# Patient Record
Sex: Female | Born: 1945
Health system: Southern US, Community
[De-identification: ages and names within clinical notes are randomized; demographics above are authoritative.]

## PROBLEM LIST (undated history)

## (undated) DIAGNOSIS — Z8719 Personal history of other diseases of the digestive system: Secondary | ICD-10-CM

## (undated) DIAGNOSIS — E039 Hypothyroidism, unspecified: Secondary | ICD-10-CM

## (undated) DIAGNOSIS — F32A Depression, unspecified: Secondary | ICD-10-CM

## (undated) DIAGNOSIS — R011 Cardiac murmur, unspecified: Secondary | ICD-10-CM

## (undated) DIAGNOSIS — I2510 Atherosclerotic heart disease of native coronary artery without angina pectoris: Secondary | ICD-10-CM

## (undated) DIAGNOSIS — D649 Anemia, unspecified: Secondary | ICD-10-CM

## (undated) DIAGNOSIS — E559 Vitamin D deficiency, unspecified: Secondary | ICD-10-CM

## (undated) DIAGNOSIS — R519 Headache, unspecified: Secondary | ICD-10-CM

## (undated) DIAGNOSIS — F419 Anxiety disorder, unspecified: Secondary | ICD-10-CM

## (undated) DIAGNOSIS — Z9889 Other specified postprocedural states: Secondary | ICD-10-CM

## (undated) DIAGNOSIS — I739 Peripheral vascular disease, unspecified: Secondary | ICD-10-CM

## (undated) DIAGNOSIS — I10 Essential (primary) hypertension: Secondary | ICD-10-CM

## (undated) DIAGNOSIS — R112 Nausea with vomiting, unspecified: Secondary | ICD-10-CM

## (undated) DIAGNOSIS — Z720 Tobacco use: Secondary | ICD-10-CM

## (undated) DIAGNOSIS — J45909 Unspecified asthma, uncomplicated: Secondary | ICD-10-CM

## (undated) DIAGNOSIS — E785 Hyperlipidemia, unspecified: Secondary | ICD-10-CM

## (undated) DIAGNOSIS — K219 Gastro-esophageal reflux disease without esophagitis: Secondary | ICD-10-CM

## (undated) DIAGNOSIS — M1990 Unspecified osteoarthritis, unspecified site: Secondary | ICD-10-CM

## (undated) HISTORY — DX: Essential (primary) hypertension: I10

## (undated) HISTORY — PX: CATARACT EXTRACTION W/ INTRAOCULAR LENS IMPLANT: SHX1309

## (undated) HISTORY — DX: Cardiac murmur, unspecified: R01.1

## (undated) HISTORY — DX: Gastro-esophageal reflux disease without esophagitis: K21.9

## (undated) HISTORY — DX: Vitamin D deficiency, unspecified: E55.9

## (undated) HISTORY — DX: Anxiety disorder, unspecified: F41.9

## (undated) HISTORY — DX: Hypothyroidism, unspecified: E03.9

## (undated) HISTORY — DX: Peripheral vascular disease, unspecified: I73.9

## (undated) HISTORY — PX: TONSILLECTOMY: SUR1361

## (undated) HISTORY — DX: Hyperlipidemia, unspecified: E78.5

## (undated) HISTORY — DX: Tobacco use: Z72.0

## (undated) HISTORY — PX: KNEE SURGERY: SHX244

---

## 2010-07-08 ENCOUNTER — Ambulatory Visit (HOSPITAL_COMMUNITY): Payer: Self-pay

## 2010-10-29 ENCOUNTER — Other Ambulatory Visit: Payer: Self-pay | Admitting: Cardiovascular Disease

## 2010-10-29 ENCOUNTER — Ambulatory Visit
Admission: RE | Admit: 2010-10-29 | Discharge: 2010-10-29 | Disposition: A | Discharge status: Home / Self Care | Payer: Medicare Other | Source: Ambulatory Visit | Attending: Cardiovascular Disease | Admitting: Cardiovascular Disease

## 2010-10-29 DIAGNOSIS — Z01811 Encounter for preprocedural respiratory examination: Secondary | ICD-10-CM

## 2010-11-04 ENCOUNTER — Ambulatory Visit (HOSPITAL_COMMUNITY)
Admission: RE | Admit: 2010-11-04 | Discharge: 2010-11-04 | Disposition: A | Discharge status: Home / Self Care | Payer: Medicare Other | Source: Ambulatory Visit | Attending: Cardiovascular Disease | Admitting: Cardiovascular Disease

## 2010-11-04 DIAGNOSIS — I10 Essential (primary) hypertension: Secondary | ICD-10-CM | POA: Insufficient documentation

## 2010-11-04 DIAGNOSIS — Z87891 Personal history of nicotine dependence: Secondary | ICD-10-CM | POA: Insufficient documentation

## 2010-11-04 DIAGNOSIS — I252 Old myocardial infarction: Secondary | ICD-10-CM | POA: Insufficient documentation

## 2010-11-04 DIAGNOSIS — I70219 Atherosclerosis of native arteries of extremities with intermittent claudication, unspecified extremity: Secondary | ICD-10-CM | POA: Insufficient documentation

## 2010-11-04 DIAGNOSIS — Z01812 Encounter for preprocedural laboratory examination: Secondary | ICD-10-CM | POA: Insufficient documentation

## 2010-11-04 DIAGNOSIS — K219 Gastro-esophageal reflux disease without esophagitis: Secondary | ICD-10-CM | POA: Insufficient documentation

## 2010-11-04 DIAGNOSIS — I708 Atherosclerosis of other arteries: Secondary | ICD-10-CM | POA: Insufficient documentation

## 2010-11-04 DIAGNOSIS — I701 Atherosclerosis of renal artery: Secondary | ICD-10-CM | POA: Insufficient documentation

## 2010-11-04 DIAGNOSIS — J45909 Unspecified asthma, uncomplicated: Secondary | ICD-10-CM | POA: Insufficient documentation

## 2010-11-25 ENCOUNTER — Ambulatory Visit (HOSPITAL_COMMUNITY)
Admission: RE | Admit: 2010-11-25 | Discharge: 2010-11-26 | Disposition: A | Discharge status: Home / Self Care | Payer: Medicare Other | Source: Ambulatory Visit | Attending: Cardiovascular Disease | Admitting: Cardiovascular Disease

## 2010-11-25 DIAGNOSIS — J45909 Unspecified asthma, uncomplicated: Secondary | ICD-10-CM | POA: Insufficient documentation

## 2010-11-25 DIAGNOSIS — I10 Essential (primary) hypertension: Secondary | ICD-10-CM | POA: Insufficient documentation

## 2010-11-25 DIAGNOSIS — I4891 Unspecified atrial fibrillation: Secondary | ICD-10-CM | POA: Insufficient documentation

## 2010-11-25 DIAGNOSIS — I708 Atherosclerosis of other arteries: Secondary | ICD-10-CM | POA: Insufficient documentation

## 2010-11-25 DIAGNOSIS — D649 Anemia, unspecified: Secondary | ICD-10-CM | POA: Insufficient documentation

## 2010-11-25 DIAGNOSIS — K219 Gastro-esophageal reflux disease without esophagitis: Secondary | ICD-10-CM | POA: Insufficient documentation

## 2010-11-25 DIAGNOSIS — I739 Peripheral vascular disease, unspecified: Secondary | ICD-10-CM | POA: Insufficient documentation

## 2010-11-25 DIAGNOSIS — Z87891 Personal history of nicotine dependence: Secondary | ICD-10-CM | POA: Insufficient documentation

## 2010-11-25 HISTORY — PX: OTHER SURGICAL HISTORY: SHX169

## 2010-11-26 LAB — BASIC METABOLIC PANEL
CO2: 29 mEq/L (ref 19–32)
Calcium: 9.1 mg/dL (ref 8.4–10.5)
GFR calc Af Amer: >60 mL/min (ref >60)
GFR calc non Af Amer: >60 mL/min (ref >60)
Sodium: 142 mEq/L (ref 135–145)

## 2010-11-26 LAB — CBC
MCH: 31.4 pg (ref 26.0–34.0)
MCHC: 31.9 g/dL (ref 30.0–36.0)
Platelets: 290 10*3/uL (ref 150–400)
RBC: 3.06 MIL/uL — ABNORMAL LOW (ref 3.87–5.11)

## 2010-11-26 LAB — POCT ACTIVATED CLOTTING TIME: Activated Clotting Time: 226 seconds

## 2010-11-28 NOTE — Discharge Summary (Signed)
  NAME:  Alexis [name], Alexis [name]              ACCOUNT NO.:  [id number]  MEDICAL RECORD NO.:  [id number]  LOCATION:  2506                         FACILITY:  [facility]  PHYSICIAN:  [name], M.D.   DATE OF BIRTH:  18-Apr-1945  DATE OF ADMISSION:  11/25/2010 DATE OF DISCHARGE:  11/26/2010                              DISCHARGE SUMMARY   DISCHARGE DIAGNOSES: 1. Claudication. 2. Vascular disease status post bilateral iliac percutaneous     transluminal angioplasty this admission. 3. Anemia, hemoglobin was 13.6 prior to procedure, 9.6 at discharge     with some ecchymosis and bruising of her right femoral artery site. 4. Low-risk Myoview an essentially normal echocardiogram as an     outpatient. 5. History of smoking, quit in 2005. 6. Treated hypertension. 7. Brief run of paroxysmal atrial tachycardia during this admission.  HOSPITAL COURSE:  [name] is a pleasant 65 year old female followed by Dr. Allyson Sabal in [facility].  She has had claudication as an outpatient.  She was admitted for elective PV angiogram on November 25, 2010.  Please see Dr. Hazle Coca complete note for details.  She tolerated this well with 90% bilateral iliac stenosis reduced to zero. Postoperatively, she is stable and we feel she can be discharged on November 26, 2010.  She is somewhat anemic compared to her admission but this may be from procedure and hematoma.  We will check her CBC as an outpatient.  We did add low-dose beta-blocker.  She will follow up with Dr. Allyson Sabal.  DISCHARGE LABORATORY DATA:  White count 12.4, hemoglobin 9.6, hematocrit 30.1, and platelets 290.  Sodium 142, potassium 3.9, BUN 11, and creatinine 0.7.  See med rec for complete discharge medications.  She will have a CBC on Monday as well.     Abelino Derrick, P.A.   ______________________________ [name], M.D.    Lenard Lance  D:  11/26/2010  T:  11/26/2010  Job:  [id number]  cc:   Olena [facility]  Electronically Signed by Corine Shelter P.A. on 11/27/2010 04:54:09 PM Electronically Signed by [name] M.D. on 11/28/2010 11:44:56 AM

## 2010-11-28 NOTE — Procedures (Signed)
NAME:  Alexis [name], Alexis [name]              ACCOUNT NO.:  [id number]  MEDICAL RECORD NO.:  [id number]  LOCATION:  [facility]                         FACILITY:  [facility]  PHYSICIAN:  [name], M.D.   DATE OF BIRTH:  Oct 19, 1945  DATE OF PROCEDURE:  11/04/2010 DATE OF DISCHARGE:                   PERIPHERAL VASCULAR INVASIVE PROCEDURE   HISTORY:  Alexis [name] is a 65 year old thin-appearing married Caucasian female, mother of one, with her grandchildren, I saw in the office on October 21, 2010, referred by Dr. Italy Hilty, for peripheral vascular evaluation.  Risk factors include hypertension.  She has claudication, worsening over the last 6 months.  The patient does occasionally get chest tightness, but the Myoview was abnormal.  She does have 80-pack-year of tobacco abuse, having quit in 2005.  Apparently had an MI at the age of 24.  A 2- D echo revealed normal LV systolic function.  Lower extremity Dopplers revealed ABIs of 0.3 and 0.4 bilaterally with occluded distal aorta. Because of that she presents today for abdominal aortography, bifemoral runoff via the right radial approach.  DESCRIPTION OF PROCEDURE:  The patient brought to the second floor of [facility] in the post absorptive state.  She is premedicated with p.o. Valium, IV Versed, and fentanyl.  Her right wrist was prepped and shaved in the usual sterile fashion.  1% Xylocaine was used for local anesthesia.  A 5-French sheath was inserted into the right radial artery using standard back lock pull back technique.  A 5- French pigtail catheter was then advanced down the descending aorta to level of the renal arteries.  The patient did have a type 3 aortic arch. Abdominal aortography with bifemoral runoff using bolus chase, digital subtraction step table technique then performed.  Retrograde pressures were monitored during the case.  The patient did receive 4000 units heparin intravenously as well as radial cocktail  via side-arm sheath.  ANGIOGRAPHIC RESULT: 1. Abdominal aorta;     a.     Renal arteries - 40% left renal artery stenosis. 2. Left lower extremity;     a.     60% segmental ostial size proximal left common iliac artery      stenosis.     b.     95-99% stenosis of the mid left common iliac artery.     c.     50% stenosis of the left common femoral artery.     d.     50% segmental mid left SFA stenosis with two-vessel runoff. 3. The posterior tibial was occluded with both sides. 4. Right lower extremity;     a.     95% proximal right common iliac artery stenosis.     b.     Two-vessel runoff an occluded posterior tibial.  IMPRESSION:  Alexis [name] has high-grade bilateral iliac disease responsible for decreased ABIs and claudication.  I believe she is a candidate for PTA and stenting.  The wire and catheter removed.  Sheath was removed and a 2-0 band was placed on the right wrist to obtain patent hemostasis.  The patient left lab in stable condition.  She gently hydrated, discharged home later today as an outpatient.  I will see back in the office next  week.  At that time, we will arrange for him back for a bilateral iliac intervention.     [name], M.D.     [name]  D:  11/04/2010  T:  11/04/2010  Job:  [id number]  cc:   Italy Hilty, MD [facility] [place], [place] [facility] PV [facility]iography Suite  Electronically Signed by [name] M.D. on 11/28/2010 11:44:50 AM

## 2010-11-28 NOTE — Procedures (Signed)
  NAME:  [name], Alexis              ACCOUNT NO.:  [id number]  MEDICAL RECORD NO.:  [id number]  LOCATION:  [facility]                         FACILITY:  [facility]  PHYSICIAN:  [name], M.D.   DATE OF BIRTH:  Jul 24, 1945  DATE OF PROCEDURE:  11/25/2010 DATE OF DISCHARGE:                   PERIPHERAL VASCULAR INVASIVE PROCEDURE   Bilateral PTA and stent procedure using "kissing stents/balloon technique on the patient [employer].  Ms. Delmont is a 65 year old appearing married Caucasian female, mother of one, grandmother of two grandchildren, who I last saw on September 14. She was referred by Dr. Rennis Golden for pathologic evaluation.  Risk factors include hypertension.  She also has claudication for over last 6 months and 80-pack year tobacco abuse, having quit in 2005.  Myoview is negative.  2-D echo was normal.  Carotid Dopplers showed ABIs 2.3 bilaterally.  She underwent peripheral angiography early this month viaright radial approach revealing high-grade bilateral iliac disease.  She presents now for PTA and stenting of her iliac arteries for lifestyle- limiting claudication.  DESCRIPTION OF PROCEDURE:  The patient brought to the second floor [place] [facility] in the post absorptive state.  She was premedicated p.o. Valium, IV Versed, and IV fentanyl.  Her right left groins were prepped and shaved in usual sterile fashion.  1% Xylocaine was used for local anesthesia.  6-French bright tip long sheath were inserted into left femoral arteries using standard Seldinger technique under fluoroscopic control.  The patient received a total 55 liters of heparin intravenously with an ACT of 226.  Total 71 mL of contrast was administered during the case.  Pre-dilatation was performed over _______ wires bilaterally with a 4 x 2 balloon.  Stenting was performed on the right with a 6 x 27 Express stainless steel balloon expandable stent and on the left with a 6 x 37 nominal pressures.   Postdilatation performed using "kissing balloon technique with a 6 x 2 balloon at the origin resulting reduction of 90% bilateral iliac stenosis to 0% residual with excellent result.  The patient tolerated the procedure well.  ACT was measured.  The sheath will be removed and pressure will be held regarding the sheath hemostasis.  The patient received 300 mg of clopidogrel.  She will be hydrated overnight, discharged on the morning. She has followup Dopplers and ABIs.  I will see back in followup.     [name], M.D.     [name]  D:  11/25/2010  T:  11/25/2010  Job:  [id number]  cc:   PV Angiographic Suite Second Floor [place] [facility]  Electronically Signed by [name] M.D. on 11/28/2010 11:44:53 AM

## 2011-06-08 DIAGNOSIS — I70219 Atherosclerosis of native arteries of extremities with intermittent claudication, unspecified extremity: Secondary | ICD-10-CM | POA: Diagnosis not present

## 2011-06-13 DIAGNOSIS — E782 Mixed hyperlipidemia: Secondary | ICD-10-CM | POA: Diagnosis not present

## 2011-06-13 DIAGNOSIS — I10 Essential (primary) hypertension: Secondary | ICD-10-CM | POA: Diagnosis not present

## 2011-06-13 DIAGNOSIS — I739 Peripheral vascular disease, unspecified: Secondary | ICD-10-CM | POA: Diagnosis not present

## 2011-07-18 DIAGNOSIS — I739 Peripheral vascular disease, unspecified: Secondary | ICD-10-CM | POA: Diagnosis not present

## 2011-07-27 DIAGNOSIS — I10 Essential (primary) hypertension: Secondary | ICD-10-CM | POA: Diagnosis not present

## 2011-07-27 DIAGNOSIS — E785 Hyperlipidemia, unspecified: Secondary | ICD-10-CM | POA: Diagnosis not present

## 2011-07-28 DIAGNOSIS — E785 Hyperlipidemia, unspecified: Secondary | ICD-10-CM | POA: Diagnosis not present

## 2011-07-28 DIAGNOSIS — E559 Vitamin D deficiency, unspecified: Secondary | ICD-10-CM | POA: Diagnosis not present

## 2011-07-28 DIAGNOSIS — E039 Hypothyroidism, unspecified: Secondary | ICD-10-CM | POA: Diagnosis not present

## 2011-07-28 DIAGNOSIS — I10 Essential (primary) hypertension: Secondary | ICD-10-CM | POA: Diagnosis not present

## 2011-07-28 DIAGNOSIS — D649 Anemia, unspecified: Secondary | ICD-10-CM | POA: Diagnosis not present

## 2011-10-13 DIAGNOSIS — I6529 Occlusion and stenosis of unspecified carotid artery: Secondary | ICD-10-CM | POA: Diagnosis not present

## 2011-12-05 DIAGNOSIS — M9981 Other biomechanical lesions of cervical region: Secondary | ICD-10-CM | POA: Diagnosis not present

## 2011-12-05 DIAGNOSIS — M999 Biomechanical lesion, unspecified: Secondary | ICD-10-CM | POA: Diagnosis not present

## 2011-12-05 DIAGNOSIS — M62838 Other muscle spasm: Secondary | ICD-10-CM | POA: Diagnosis not present

## 2011-12-08 DIAGNOSIS — M999 Biomechanical lesion, unspecified: Secondary | ICD-10-CM | POA: Diagnosis not present

## 2011-12-08 DIAGNOSIS — M9981 Other biomechanical lesions of cervical region: Secondary | ICD-10-CM | POA: Diagnosis not present

## 2011-12-08 DIAGNOSIS — M62838 Other muscle spasm: Secondary | ICD-10-CM | POA: Diagnosis not present

## 2011-12-13 DIAGNOSIS — M62838 Other muscle spasm: Secondary | ICD-10-CM | POA: Diagnosis not present

## 2011-12-13 DIAGNOSIS — M9981 Other biomechanical lesions of cervical region: Secondary | ICD-10-CM | POA: Diagnosis not present

## 2011-12-13 DIAGNOSIS — M999 Biomechanical lesion, unspecified: Secondary | ICD-10-CM | POA: Diagnosis not present

## 2011-12-18 DIAGNOSIS — M62838 Other muscle spasm: Secondary | ICD-10-CM | POA: Diagnosis not present

## 2011-12-18 DIAGNOSIS — M9981 Other biomechanical lesions of cervical region: Secondary | ICD-10-CM | POA: Diagnosis not present

## 2011-12-18 DIAGNOSIS — M999 Biomechanical lesion, unspecified: Secondary | ICD-10-CM | POA: Diagnosis not present

## 2011-12-22 DIAGNOSIS — M62838 Other muscle spasm: Secondary | ICD-10-CM | POA: Diagnosis not present

## 2011-12-22 DIAGNOSIS — M999 Biomechanical lesion, unspecified: Secondary | ICD-10-CM | POA: Diagnosis not present

## 2011-12-22 DIAGNOSIS — M9981 Other biomechanical lesions of cervical region: Secondary | ICD-10-CM | POA: Diagnosis not present

## 2011-12-27 DIAGNOSIS — M9981 Other biomechanical lesions of cervical region: Secondary | ICD-10-CM | POA: Diagnosis not present

## 2011-12-27 DIAGNOSIS — M62838 Other muscle spasm: Secondary | ICD-10-CM | POA: Diagnosis not present

## 2011-12-27 DIAGNOSIS — M999 Biomechanical lesion, unspecified: Secondary | ICD-10-CM | POA: Diagnosis not present

## 2012-01-05 DIAGNOSIS — M9981 Other biomechanical lesions of cervical region: Secondary | ICD-10-CM | POA: Diagnosis not present

## 2012-01-05 DIAGNOSIS — M62838 Other muscle spasm: Secondary | ICD-10-CM | POA: Diagnosis not present

## 2012-01-05 DIAGNOSIS — M999 Biomechanical lesion, unspecified: Secondary | ICD-10-CM | POA: Diagnosis not present

## 2012-01-10 DIAGNOSIS — I739 Peripheral vascular disease, unspecified: Secondary | ICD-10-CM | POA: Diagnosis not present

## 2012-01-10 DIAGNOSIS — I70219 Atherosclerosis of native arteries of extremities with intermittent claudication, unspecified extremity: Secondary | ICD-10-CM | POA: Diagnosis not present

## 2012-01-12 DIAGNOSIS — M999 Biomechanical lesion, unspecified: Secondary | ICD-10-CM | POA: Diagnosis not present

## 2012-01-12 DIAGNOSIS — M9981 Other biomechanical lesions of cervical region: Secondary | ICD-10-CM | POA: Diagnosis not present

## 2012-01-12 DIAGNOSIS — M62838 Other muscle spasm: Secondary | ICD-10-CM | POA: Diagnosis not present

## 2012-01-18 DIAGNOSIS — I739 Peripheral vascular disease, unspecified: Secondary | ICD-10-CM | POA: Diagnosis not present

## 2012-01-18 DIAGNOSIS — I10 Essential (primary) hypertension: Secondary | ICD-10-CM | POA: Diagnosis not present

## 2012-01-18 DIAGNOSIS — I6529 Occlusion and stenosis of unspecified carotid artery: Secondary | ICD-10-CM | POA: Diagnosis not present

## 2012-01-18 DIAGNOSIS — E782 Mixed hyperlipidemia: Secondary | ICD-10-CM | POA: Diagnosis not present

## 2012-03-02 DIAGNOSIS — I10 Essential (primary) hypertension: Secondary | ICD-10-CM | POA: Diagnosis not present

## 2012-03-02 DIAGNOSIS — E039 Hypothyroidism, unspecified: Secondary | ICD-10-CM | POA: Diagnosis not present

## 2012-03-02 DIAGNOSIS — E559 Vitamin D deficiency, unspecified: Secondary | ICD-10-CM | POA: Diagnosis not present

## 2012-03-02 DIAGNOSIS — E785 Hyperlipidemia, unspecified: Secondary | ICD-10-CM | POA: Diagnosis not present

## 2012-03-02 DIAGNOSIS — F411 Generalized anxiety disorder: Secondary | ICD-10-CM | POA: Diagnosis not present

## 2012-07-16 ENCOUNTER — Encounter: Payer: Self-pay | Admitting: *Deleted

## 2012-07-18 ENCOUNTER — Other Ambulatory Visit: Payer: Self-pay | Admitting: Physician Assistant

## 2012-07-18 ENCOUNTER — Telehealth: Payer: Self-pay | Admitting: Physician Assistant

## 2012-07-18 MED ORDER — CITALOPRAM HYDROBROMIDE 40 MG PO TABS: 40.0000 mg | ORAL_TABLET | Freq: Every day | ORAL | Status: DC

## 2012-07-18 MED ORDER — AMLODIPINE BESYLATE 10 MG PO TABS: 10.0000 mg | ORAL_TABLET | Freq: Every day | ORAL | Status: DC

## 2012-07-18 NOTE — Telephone Encounter (Signed)
Med rf °

## 2012-07-19 ENCOUNTER — Encounter: Payer: Self-pay | Admitting: Internal Medicine

## 2012-07-19 ENCOUNTER — Encounter: Payer: Self-pay | Admitting: Cardiovascular Disease

## 2012-07-19 ENCOUNTER — Ambulatory Visit (INDEPENDENT_AMBULATORY_CARE_PROVIDER_SITE_OTHER): Payer: Medicare Other | Admitting: Cardiovascular Disease

## 2012-07-19 VITALS — BP 142/72 | HR 55 | Ht 60.0 in | Wt 127.0 lb

## 2012-07-19 DIAGNOSIS — E785 Hyperlipidemia, unspecified: Secondary | ICD-10-CM

## 2012-07-19 DIAGNOSIS — I739 Peripheral vascular disease, unspecified: Secondary | ICD-10-CM | POA: Insufficient documentation

## 2012-07-19 DIAGNOSIS — I771 Stricture of artery: Secondary | ICD-10-CM | POA: Insufficient documentation

## 2012-07-19 DIAGNOSIS — I708 Atherosclerosis of other arteries: Secondary | ICD-10-CM

## 2012-07-19 DIAGNOSIS — I779 Disorder of arteries and arterioles, unspecified: Secondary | ICD-10-CM

## 2012-07-19 NOTE — Progress Notes (Signed)
07/19/2012 Rocco Pauls   1945-08-18  [id number]  Primary Physician Provider Not In System Primary Cardiologist: Runell Gess MD Roseanne Reno   HPI:  She is a 67 year old thin-appearing, married Caucasian female, mother of 1, grandmother to 3 grandchildren who was referred to me for claudication. She has a history of hypertension, hyperlipidemia as well as remote tobacco abuse. She has symptomatic claudication. I stented both of her iliac arteries November 25, 2010, resulting in marked improvement in her ability to ambulate, as well as her Doppler studies. She also has evidence of a left subclavian artery stenosis by duplex ultrasound and symptoms. Her most recent Doppler studies performed January 10, 2012, did show moderate "in-stent restenosis" within the left-greater-than-right common iliac artery stent with ABIs of 0.93 on the right and 0.86 on the left. She has noticed some moderate decrement in her ability to ambulate. Her lipid profile is followed by her PCP. She also has mild to moderate carotid disease and is neurologically asymptomatic. Since I saw her last she denies chest pain shortness of breath or claudication. She does complain of some left upper extremity claudication which really hasn't changed in frequency or severity. We'll continue to follow her Dopplers.    Current Outpatient Prescriptions  Medication Sig Dispense Refill  . albuterol (PROVENTIL HFA;VENTOLIN HFA) 108 (90 BASE) MCG/ACT inhaler Inhale 2 puffs into the lungs every 6 (six) hours as needed for wheezing.      Marland Kitchen amLODipine (NORVASC) 10 MG tablet Take 1 tablet (10 mg total) by mouth daily.  30 tablet  3  . aspirin 81 MG tablet Take 81 mg by mouth daily.      Marland Kitchen atorvastatin (LIPITOR) 10 MG tablet Take 10 mg by mouth daily.      . benazepril (LOTENSIN) 40 MG tablet Take 40 mg by mouth daily.      . cholecalciferol (VITAMIN D) 1000 UNITS tablet Take 2,000 Units by mouth daily.      . citalopram (CELEXA)  20 MG tablet Take 20 mg by mouth daily.      . clopidogrel (PLAVIX) 75 MG tablet Take 75 mg by mouth daily.      . diphenhydrAMINE (BENADRYL) 25 mg capsule Take 25 mg by mouth. 2 tabs at night      . levothyroxine (SYNTHROID, LEVOTHROID) 50 MCG tablet Take 50 mcg by mouth daily before breakfast.      . montelukast (SINGULAIR) 10 MG tablet Take 10 mg by mouth at bedtime.      . nebivolol (BYSTOLIC) 10 MG tablet Take 10 mg by mouth daily.      Marland Kitchen omeprazole (PRILOSEC) 20 MG capsule Take 20 mg by mouth daily.      . valsartan (DIOVAN) 80 MG tablet Take 80 mg by mouth daily.       No current facility-administered medications for this visit.    Allergies  Allergen Reactions  . Banana   . Chicken Allergy   . Demerol (Meperidine)   . Eggs Or Egg-Derived Products   . Metoprolol     asthma  . Milk-Related Compounds   . Orange Fruit (Citrus)   . Other     [place], pecans  . Pineapple   . Salmon (Fish Allergy)     And tuna and oysters    History   Social History  . Marital Status: Married    Spouse Name: N/A    Number of Children: N/A  . Years of Education: N/A   Occupational History  .  Not on file.   Social History Main Topics  . Smoking status: Former Smoker    Quit date: 02/28/2002  . Smokeless tobacco: Not on file  . Alcohol Use: Not on file  . Drug Use: Not on file  . Sexually Active: Not on file   Other Topics Concern  . Not on file   Social History Narrative  . No narrative on file     Review of Systems: General: negative for chills, fever, night sweats or weight changes.  Cardiovascular: negative for chest pain, dyspnea on exertion, edema, orthopnea, palpitations, paroxysmal nocturnal dyspnea or shortness of breath Dermatological: negative for rash Respiratory: negative for cough or wheezing Urologic: negative for hematuria Abdominal: negative for nausea, vomiting, diarrhea, bright red blood per rectum, melena, or hematemesis Neurologic: negative for visual  changes, syncope, or dizziness All other systems reviewed and are otherwise negative except as noted above.    Blood pressure 142/72, pulse 55, height 5' (1.524 m), weight 127 lb (57.607 kg).  General appearance: alert and no distress Neck: no adenopathy, no JVD, supple, symmetrical, trachea midline, thyroid not enlarged, symmetric, no tenderness/mass/nodules and bilateral carotid bruits as well as left subclavian bruit Lungs: clear to auscultation bilaterally Heart: regular rate and rhythm, S1, S2 normal, no murmur, click, rub or gallop Extremities: extremities normal, atraumatic, no cyanosis or edema  EKG sinus bradycardia at 55 without ST or T wave changes  ASSESSMENT AND PLAN:   Peripheral vascular disease with claudication I performed bilateral common iliac artery stenting 11/25/10. We've been following motion the Doppler since. She has some mild progression of disease.  Carotid artery disease She's neurologically asymptomatic. We'll continue to follow her by Doppler studies  Subclavian artery stenosis, left By duplex ultrasound and by history and physical exam. She is minimally symptomatic and wishes to follow this conservatively. I have told her that she probably has a percutaneous option should she desire to pursue this      Runell Gess MD [facility], [facility] 07/19/2012 3:35 PM

## 2012-07-19 NOTE — Assessment & Plan Note (Signed)
By duplex ultrasound and by history and physical exam. She is minimally symptomatic and wishes to follow this conservatively. I have told her that she probably has a percutaneous option should she desire to pursue this

## 2012-07-19 NOTE — Assessment & Plan Note (Signed)
She's neurologically asymptomatic. We'll continue to follow her by Doppler studies

## 2012-07-19 NOTE — Patient Instructions (Addendum)
  Your physician wants you to follow-up with him in : 12 MONTHS                                            and with an extender in : 6 MONTHS                    You will receive a reminder letter in the mail one month in advance. If you don't receive a letter, please call our office to schedule the follow-up appointment.     Your physician has recommended you make the following change in your medication: NO CHANGE IN YOUR MEDICATION   Your physician has ordered the following tests: CAROTID DOPPLER, UPPER EXTREMITY DOPPLER, AND LOWER EXTREMITY DOPPLER IN AUGUST 2014

## 2012-07-19 NOTE — Assessment & Plan Note (Signed)
I performed bilateral common iliac artery stenting 11/25/10. We've been following motion the Doppler since. She has some mild progression of disease.

## 2012-07-24 ENCOUNTER — Other Ambulatory Visit: Payer: Self-pay | Admitting: *Deleted

## 2012-07-24 MED ORDER — NEBIVOLOL HCL 10 MG PO TABS: 10.0000 mg | ORAL_TABLET | Freq: Every day | ORAL | Status: DC

## 2012-07-24 NOTE — Telephone Encounter (Signed)
bystolic renewed x 6

## 2012-08-02 ENCOUNTER — Telehealth: Payer: Self-pay | Admitting: Physician Assistant

## 2012-08-02 MED ORDER — VALSARTAN 80 MG PO TABS: 80.0000 mg | ORAL_TABLET | Freq: Every day | ORAL | Status: DC

## 2012-08-02 NOTE — Telephone Encounter (Signed)
Medication refilled per protocol. 

## 2012-08-06 ENCOUNTER — Telehealth: Payer: Self-pay | Admitting: Physician Assistant

## 2012-08-06 MED ORDER — LEVOTHYROXINE SODIUM 50 MCG PO TABS: 50.0000 ug | ORAL_TABLET | Freq: Every day | ORAL | Status: DC

## 2012-08-06 NOTE — Telephone Encounter (Signed)
Medication refilled per protocol. 

## 2012-08-16 ENCOUNTER — Ambulatory Visit (INDEPENDENT_AMBULATORY_CARE_PROVIDER_SITE_OTHER): Payer: Medicare Other | Admitting: Physician Assistant

## 2012-08-16 ENCOUNTER — Encounter: Payer: Self-pay | Admitting: Physician Assistant

## 2012-08-16 VITALS — BP 124/68 | HR 56 | Temp 98.1°F | Resp 18 | Ht 59.0 in | Wt 127.0 lb

## 2012-08-16 DIAGNOSIS — E039 Hypothyroidism, unspecified: Secondary | ICD-10-CM

## 2012-08-16 DIAGNOSIS — E785 Hyperlipidemia, unspecified: Secondary | ICD-10-CM | POA: Diagnosis not present

## 2012-08-16 DIAGNOSIS — F411 Generalized anxiety disorder: Secondary | ICD-10-CM

## 2012-08-16 DIAGNOSIS — E559 Vitamin D deficiency, unspecified: Secondary | ICD-10-CM | POA: Diagnosis not present

## 2012-08-16 DIAGNOSIS — I10 Essential (primary) hypertension: Secondary | ICD-10-CM

## 2012-08-16 DIAGNOSIS — K219 Gastro-esophageal reflux disease without esophagitis: Secondary | ICD-10-CM

## 2012-08-16 DIAGNOSIS — F419 Anxiety disorder, unspecified: Secondary | ICD-10-CM

## 2012-08-16 MED ORDER — PANTOPRAZOLE SODIUM 40 MG PO TBEC: 40.0000 mg | DELAYED_RELEASE_TABLET | Freq: Every day | ORAL | Status: DC

## 2012-08-16 NOTE — Progress Notes (Signed)
Patient ID: Alexis [name] MRN: [id number], DOB: Feb 10, 1946, 67 y.o. Date of Encounter: @DATE @  Chief Complaint:  Chief Complaint  Patient presents with  . check up  med refills    is fasting    HPI: 67 y.o. year old white female  presents for routine f/u OV. She has no complaints at present.   1-GERD: She does report she needs Rx med that she cna take for GERD that will not interfere with Plavix. Says she recently started taking Prilosec b/c was having a lot of symptoms. Has tried to avoid certain foods and stop eating prior to lying down, but even with these changes, still has heartburn. Therefore, is taking Prilosec OTC daily. This controls her symptoms.   2-Generalizeed Anxiety D/O: At LOV we increased Celexa from 20mg  to 40 mg. This has helped. She has noticed a difference. She is having no adverse effects. She want to cont current dose. She tried a Klonopin and "it knocked her for a loop" so she hasnot taken any more.   3-Vit D Def; IS taking 2,000 IU QD.  4-PVD: Had OV with Dr Allyson Sabal in May. Is scheduled for Dopplers of upper extremities, lower extremities, and carotids in August.   5-Thyroid; She is taking Rx-66mcg QD. No change in her hair or skin, no weight changes, no palpitations.   No chest pressure, heaviness, tightness, squeezing even with exertion. No SOB/DOE.    Past Medical History  Diagnosis Date  . Tobacco abuse   . Claudication     stent in bil iliac 11/25/10  . PVD (peripheral vascular disease)     lower ext and upper ext-L subclavian stenosis by ultrasound, nl nuclear test- 10/27/10  . Murmur, cardiac     echo 10/12/10- EF >55%, mod sclerotic aortic valve  . Hyperlipemia   . HTN (hypertension)   . Anxiety   . Hypothyroidism   . GERD (gastroesophageal reflux disease)   . Vitamin D deficiency      Home Meds: See attached medication section for current medication list. Any medications entered into computer today will not appear on this note's list. The  medications listed below were entered prior to today. Current Outpatient Prescriptions on File Prior to Visit  Medication Sig Dispense Refill  . albuterol (PROVENTIL HFA;VENTOLIN HFA) 108 (90 BASE) MCG/ACT inhaler Inhale 2 puffs into the lungs every 6 (six) hours as needed for wheezing.      Marland Kitchen amLODipine (NORVASC) 10 MG tablet Take 1 tablet (10 mg total) by mouth daily.  30 tablet  3  . aspirin 81 MG tablet Take 81 mg by mouth daily.      Marland Kitchen atorvastatin (LIPITOR) 10 MG tablet Take 10 mg by mouth daily.      . benazepril (LOTENSIN) 40 MG tablet Take 40 mg by mouth daily.      . cholecalciferol (VITAMIN D) 1000 UNITS tablet Take 2,000 Units by mouth daily.      . citalopram (CELEXA) 20 MG tablet Take 20 mg by mouth daily. States only takes when she needs it      . clopidogrel (PLAVIX) 75 MG tablet Take 75 mg by mouth daily.      . diphenhydrAMINE (BENADRYL) 25 mg capsule Take 25 mg by mouth. 2 tabs at night      . levothyroxine (SYNTHROID, LEVOTHROID) 50 MCG tablet Take 1 tablet (50 mcg total) by mouth daily before breakfast.  30 tablet  5  . montelukast (SINGULAIR) 10 MG tablet Take 10 mg  by mouth at bedtime.      . nebivolol (BYSTOLIC) 10 MG tablet Take 1 tablet (10 mg total) by mouth daily.  30 tablet  5  . omeprazole (PRILOSEC) 20 MG capsule Take 20 mg by mouth daily.      . valsartan (DIOVAN) 80 MG tablet Take 1 tablet (80 mg total) by mouth daily.  30 tablet  3   No current facility-administered medications on file prior to visit.    Allergies:  Allergies  Allergen Reactions  . Banana   . Chicken Allergy   . Demerol (Meperidine)   . Eggs Or Egg-Derived Products   . Metoprolol     asthma  . Milk-Related Compounds   . Orange Fruit (Citrus)   . Other     [place], pecans  . Pineapple   . Salmon (Fish Allergy)     And tuna and oysters    History   Social History  . Marital Status: Married    Spouse Name: N/A    Number of Children: N/A  . Years of Education: N/A    Occupational History  . Not on file.   Social History Main Topics  . Smoking status: Former Smoker    Quit date: 02/28/2002  . Smokeless tobacco: Not on file  . Alcohol Use: Not on file  . Drug Use: Not on file  . Sexually Active: Not on file   Other Topics Concern  . Not on file   Social History Narrative  . No narrative on file    History reviewed. No pertinent family history.   Review of Systems:  See HPI for pertinent ROS. All other ROS negative.    Physical Exam: Blood pressure 124/68, pulse 56, temperature 98.1 F (36.7 C), temperature source Oral, resp. rate 18, height 4\' 11"  (1.499 m), weight 127 lb (57.607 kg)., Body mass index is 25.64 kg/(m^2). General: WNWD WF. Very Pleasant. Appears in no acute distress. Neck: Supple. No thyromegaly. No lymphadenopathy.Right Carotid Bruit. No bruit on left.  Lungs: Clear bilaterally to auscultation without wheezes, rales, or rhonchi. Breathing is unlabored. Heart: RRR with S1 S2. No murmurs, rubs, or gallops. Abdomen: Soft, non-tender, non-distended with normoactive bowel sounds. No hepatomegaly. No rebound/guarding. No obvious abdominal masses. Musculoskeletal:  Strength and tone normal for age. Extremities/Skin: Warm and dry.  No edema. Neuro: Alert and oriented X 3. Moves all extremities spontaneously. Gait is normal. CNII-XII grossly in tact. Psych:  Responds to questions appropriately with a normal affect.     ASSESSMENT AND PLAN:  67 y.o. year old female with  1. Anxiety Cont Celexa 40mg  QD.  If needs Klonopin, take 1/2-prn  2. Hypothyroidism On QD currently. Check TSH  3. GERD (gastroesophageal reflux disease) On Plavix. Stop Prilosec. Change to Protonix. Cont to avoid spicy foods and other foods that trigger symtoms. Avoid eating/drinking 3 hours prior to lying down. Place head of bed at incline if needed. - pantoprazole (PROTONIX) 40 MG tablet; Take 1 tablet (40 mg total) by mouth daily.  Dispense: 30  tablet; Refill: 3  4. HTN (hypertension) At Goal. Cont current meds. Check labs to monitor.  - COMPLETE METABOLIC PANEL WITH GFR  5. Hyperlipemia--PVD--Goal LDL 70 Check Labs to monitor. On Lipitor 10mg  QD. - COMPLETE METABOLIC PANEL WITH GFR - Lipid panel  6. Vitamin D deficiency On 2,000 IU QD. Check lab level now.  - Vitamin D 25 hydroxy  7. PVD- Per Dr. Allyson Sabal  8. Preventive Health Care: I have discussed  Mammogram/ Pelvic Exam, Pap Smear, DEXA, Colonoscopy with her multiple times in the past and again today. She still defers. She is aware of risks and benefits of each procedure but still refuses.   [address] [place], [place], [facility] 08/16/2012 1:48 PM

## 2012-08-17 LAB — COMPLETE METABOLIC PANEL WITH GFR
ALT: 9 U/L (ref 0–35)
AST: 16 U/L (ref 0–37)
Alkaline Phosphatase: 76 U/L (ref 39–117)
BUN: 13 mg/dL (ref 6–23)
Chloride: 102 mEq/L (ref 96–112)
Creat: 0.8 mg/dL (ref 0.50–1.10)
Total Bilirubin: 0.1 mg/dL — ABNORMAL LOW (ref 0.3–1.2)

## 2012-08-17 LAB — LIPID PANEL
Cholesterol: 158 mg/dL (ref 0–200)
HDL: 48 mg/dL (ref >39)
LDL Cholesterol: 77 mg/dL (ref 0–99)
Total CHOL/HDL Ratio: 3.3 Ratio
Triglycerides: 163 mg/dL — ABNORMAL HIGH (ref <150)
VLDL: 33 mg/dL (ref 0–40)

## 2012-08-28 ENCOUNTER — Telehealth: Payer: Self-pay | Admitting: Physician Assistant

## 2012-08-28 MED ORDER — MONTELUKAST SODIUM 10 MG PO TABS: 10.0000 mg | ORAL_TABLET | Freq: Every day | ORAL | Status: DC

## 2012-08-28 NOTE — Telephone Encounter (Signed)
Rx Refilled  

## 2012-10-02 ENCOUNTER — Telehealth: Payer: Self-pay | Admitting: Physician Assistant

## 2012-10-02 MED ORDER — CITALOPRAM HYDROBROMIDE 40 MG PO TABS: 40.0000 mg | ORAL_TABLET | Freq: Every day | ORAL | Status: DC

## 2012-10-02 NOTE — Telephone Encounter (Signed)
Medication refilled per protocol. 

## 2012-10-03 ENCOUNTER — Other Ambulatory Visit: Payer: Self-pay

## 2012-10-05 ENCOUNTER — Telehealth: Payer: Self-pay | Admitting: Physician Assistant

## 2012-10-05 MED ORDER — BENAZEPRIL HCL 40 MG PO TABS: 40.0000 mg | ORAL_TABLET | Freq: Every day | ORAL | Status: DC

## 2012-10-05 NOTE — Telephone Encounter (Signed)
Medication refilled per protocol. 

## 2012-10-11 ENCOUNTER — Ambulatory Visit (HOSPITAL_BASED_OUTPATIENT_CLINIC_OR_DEPARTMENT_OTHER)
Admission: RE | Admit: 2012-10-11 | Discharge: 2012-10-11 | Disposition: A | Discharge status: Home / Self Care | Payer: Medicare Other | Source: Ambulatory Visit | Attending: Cardiovascular Disease | Admitting: Cardiovascular Disease

## 2012-10-11 ENCOUNTER — Ambulatory Visit (HOSPITAL_COMMUNITY)
Admission: RE | Admit: 2012-10-11 | Discharge: 2012-10-11 | Disposition: A | Discharge status: Home / Self Care | Payer: Medicare Other | Source: Ambulatory Visit | Attending: Cardiovascular Disease | Admitting: Cardiovascular Disease

## 2012-10-11 DIAGNOSIS — I6529 Occlusion and stenosis of unspecified carotid artery: Secondary | ICD-10-CM

## 2012-10-11 DIAGNOSIS — I739 Peripheral vascular disease, unspecified: Secondary | ICD-10-CM

## 2012-10-11 DIAGNOSIS — I70219 Atherosclerosis of native arteries of extremities with intermittent claudication, unspecified extremity: Secondary | ICD-10-CM | POA: Diagnosis not present

## 2012-10-11 DIAGNOSIS — I771 Stricture of artery: Secondary | ICD-10-CM

## 2012-10-11 NOTE — Progress Notes (Signed)
Carotid Duplex Completed. Shaylen Nephew, BS, RDMS, RVT  

## 2012-10-11 NOTE — Progress Notes (Signed)
Lower Ext. Arterial Duplex Completed. Lauralee Waters, BS, RDMS, RVT  

## 2012-10-12 NOTE — Progress Notes (Signed)
Upper Ext. Arterial Duplex Completed. Keimani Laufer, BS, RDMS, RVT  

## 2012-10-18 ENCOUNTER — Other Ambulatory Visit: Payer: Self-pay | Admitting: *Deleted

## 2012-10-18 DIAGNOSIS — I739 Peripheral vascular disease, unspecified: Secondary | ICD-10-CM

## 2012-10-22 ENCOUNTER — Telehealth: Payer: Self-pay | Admitting: Physician Assistant

## 2012-10-22 MED ORDER — ATORVASTATIN CALCIUM 10 MG PO TABS: 10.0000 mg | ORAL_TABLET | Freq: Every day | ORAL | Status: DC

## 2012-10-22 MED ORDER — AMLODIPINE BESYLATE 10 MG PO TABS: 10.0000 mg | ORAL_TABLET | Freq: Every day | ORAL | Status: DC

## 2012-10-22 NOTE — Telephone Encounter (Addendum)
Atorvastatin 10 mg tab 1 QHS #30 Amlodipine 10 mg tab 1 QD #30

## 2012-10-22 NOTE — Telephone Encounter (Signed)
Medication refilled per protocol. 

## 2012-11-27 ENCOUNTER — Telehealth: Payer: Self-pay | Admitting: Physician Assistant

## 2012-11-27 MED ORDER — VALSARTAN 80 MG PO TABS: 80.0000 mg | ORAL_TABLET | Freq: Every day | ORAL | Status: DC

## 2012-11-27 NOTE — Telephone Encounter (Signed)
Medication refilled per protocol. 

## 2012-11-27 NOTE — Telephone Encounter (Signed)
Diovan 80 mg tab 1 QD #30

## 2012-12-10 ENCOUNTER — Telehealth: Payer: Self-pay | Admitting: Family Medicine

## 2012-12-10 DIAGNOSIS — K219 Gastro-esophageal reflux disease without esophagitis: Secondary | ICD-10-CM

## 2012-12-10 MED ORDER — PANTOPRAZOLE SODIUM 40 MG PO TBEC: 40.0000 mg | DELAYED_RELEASE_TABLET | Freq: Every day | ORAL | Status: DC

## 2012-12-10 NOTE — Telephone Encounter (Signed)
Pantoprazole 40 mg QD   Medication refilled per protocol.

## 2013-01-03 ENCOUNTER — Other Ambulatory Visit: Payer: Self-pay

## 2013-01-18 ENCOUNTER — Other Ambulatory Visit: Payer: Self-pay | Admitting: *Deleted

## 2013-01-18 MED ORDER — NEBIVOLOL HCL 10 MG PO TABS: 10.0000 mg | ORAL_TABLET | Freq: Every day | ORAL | Status: DC

## 2013-01-18 NOTE — Telephone Encounter (Signed)
Rx was sent to pharmacy electronically. 

## 2013-02-07 ENCOUNTER — Other Ambulatory Visit: Payer: Self-pay | Admitting: *Deleted

## 2013-02-07 MED ORDER — CLOPIDOGREL BISULFATE 75 MG PO TABS: 75.0000 mg | ORAL_TABLET | Freq: Every day | ORAL | Status: DC

## 2013-02-07 NOTE — Telephone Encounter (Signed)
Rx was sent to pharmacy electronically. 

## 2013-03-04 ENCOUNTER — Encounter: Payer: Self-pay | Admitting: Family Medicine

## 2013-03-04 ENCOUNTER — Telehealth: Payer: Self-pay | Admitting: Family Medicine

## 2013-03-04 MED ORDER — LEVOTHYROXINE SODIUM 50 MCG PO TABS
50.0000 ug | ORAL_TABLET | Freq: Every day | ORAL | Status: DC
Start: 2013-03-04 — End: 2013-04-02

## 2013-03-04 NOTE — Telephone Encounter (Signed)
Medication refill for one time only.  Patient needs to be seen.  Letter sent for patient to call and schedule 

## 2013-03-25 ENCOUNTER — Other Ambulatory Visit: Payer: Self-pay | Admitting: *Deleted

## 2013-03-25 MED ORDER — NEBIVOLOL HCL 10 MG PO TABS: 10.0000 mg | ORAL_TABLET | Freq: Every day | ORAL | Status: DC

## 2013-03-25 NOTE — Telephone Encounter (Signed)
Rx was sent to pharmacy electronically. 

## 2013-03-28 ENCOUNTER — Encounter: Payer: Self-pay | Admitting: Physician Assistant

## 2013-03-28 ENCOUNTER — Ambulatory Visit (INDEPENDENT_AMBULATORY_CARE_PROVIDER_SITE_OTHER): Payer: Medicare Other | Admitting: Physician Assistant

## 2013-03-28 VITALS — BP 136/68 | HR 64 | Temp 97.7°F | Resp 18 | Wt 128.0 lb

## 2013-03-28 DIAGNOSIS — E039 Hypothyroidism, unspecified: Secondary | ICD-10-CM | POA: Diagnosis not present

## 2013-03-28 DIAGNOSIS — F419 Anxiety disorder, unspecified: Secondary | ICD-10-CM

## 2013-03-28 DIAGNOSIS — F411 Generalized anxiety disorder: Secondary | ICD-10-CM

## 2013-03-28 DIAGNOSIS — E785 Hyperlipidemia, unspecified: Secondary | ICD-10-CM

## 2013-03-28 DIAGNOSIS — I779 Disorder of arteries and arterioles, unspecified: Secondary | ICD-10-CM

## 2013-03-28 DIAGNOSIS — I771 Stricture of artery: Secondary | ICD-10-CM

## 2013-03-28 DIAGNOSIS — K219 Gastro-esophageal reflux disease without esophagitis: Secondary | ICD-10-CM

## 2013-03-28 DIAGNOSIS — I10 Essential (primary) hypertension: Secondary | ICD-10-CM

## 2013-03-28 DIAGNOSIS — I739 Peripheral vascular disease, unspecified: Secondary | ICD-10-CM

## 2013-03-28 DIAGNOSIS — E559 Vitamin D deficiency, unspecified: Secondary | ICD-10-CM

## 2013-03-28 DIAGNOSIS — I708 Atherosclerosis of other arteries: Secondary | ICD-10-CM

## 2013-03-28 NOTE — Progress Notes (Signed)
Patient ID: Alexis [name] MRN: [id number], DOB: [birth year], 68 y.o. Date of Encounter: @DATE @  Chief Complaint:  Chief Complaint  Patient presents with  . Medication Refill    HPI: 68 y.o. year old white female  presents a routine followup office visit.  She has no complaints today. Says that she's been feeling very well. Says she has continued to followup with Dr. Allyson Sabal regarding her PVD and has been having routine ultrasound tests to follow this also.  GERD: She states that she does have to take her protonix every day or else she has heartburn symptoms. However with her protonix her symptoms are controlled.  Generalized anxiety disorder:   She is on Celexa 40 mg. She states this is working well and is controlling her anxiety very well. She is having no adverse effects.  Vitamin D deficiency: She is taking 2000 units daily.  Thyroid: She is taking the prescription 50 mcg daily. No change in her hair or scan and no weight changes or palpitations.  No chest pressure, heaviness, tightness, squeezing even with exertion. No increased shortness of breath/dyspnea on exertion.   Past Medical History  Diagnosis Date  . Tobacco abuse   . Claudication     stent in bil iliac 11/25/10  . PVD (peripheral vascular disease)     lower ext and upper ext-L subclavian stenosis by ultrasound, nl nuclear test- 10/27/10  . Murmur, cardiac     echo 10/12/10- EF >55%, mod sclerotic aortic valve  . Hyperlipemia   . HTN (hypertension)   . Anxiety   . Hypothyroidism   . GERD (gastroesophageal reflux disease)   . Vitamin D deficiency      Home Meds: See attached medication section for current medication list. Any medications entered into computer today will not appear on this note's list. The medications listed below were entered prior to today. Current Outpatient Prescriptions on File Prior to Visit  Medication Sig Dispense Refill  . albuterol (PROVENTIL HFA;VENTOLIN HFA) 108 (90 BASE)  MCG/ACT inhaler Inhale 2 puffs into the lungs every 6 (six) hours as needed for wheezing.      Marland Kitchen amLODipine (NORVASC) 10 MG tablet Take 1 tablet (10 mg total) by mouth daily.  30 tablet  4  . aspirin 81 MG tablet Take 81 mg by mouth daily.      Marland Kitchen atorvastatin (LIPITOR) 10 MG tablet Take 1 tablet (10 mg total) by mouth daily.  30 tablet  4  . benazepril (LOTENSIN) 40 MG tablet Take 1 tablet (40 mg total) by mouth daily.  30 tablet  5  . cholecalciferol (VITAMIN D) 1000 UNITS tablet Take 2,000 Units by mouth daily.      . citalopram (CELEXA) 40 MG tablet Take 1 tablet (40 mg total) by mouth daily.  30 tablet  5  . clopidogrel (PLAVIX) 75 MG tablet Take 1 tablet (75 mg total) by mouth daily.  30 tablet  5  . diphenhydrAMINE (BENADRYL) 25 mg capsule Take 25 mg by mouth. 2 tabs at night      . levothyroxine (SYNTHROID, LEVOTHROID) 50 MCG tablet Take 1 tablet (50 mcg total) by mouth daily before breakfast.  30 tablet  0  . montelukast (SINGULAIR) 10 MG tablet Take 1 tablet (10 mg total) by mouth at bedtime.  30 tablet  11  . nebivolol (BYSTOLIC) 10 MG tablet Take 1 tablet (10 mg total) by mouth daily.  30 tablet  4  . pantoprazole (PROTONIX) 40 MG tablet Take 1  tablet (40 mg total) by mouth daily.  30 tablet  3  . valsartan (DIOVAN) 80 MG tablet Take 1 tablet (80 mg total) by mouth daily.  30 tablet  3   No current facility-administered medications on file prior to visit.    Allergies:  Allergies  Allergen Reactions  . Banana   . Chicken Allergy   . Demerol [Meperidine]   . Eggs Or Egg-Derived Products   . Metoprolol     asthma  . Milk-Related Compounds   . Orange Fruit [Citrus]   . Other     [place], pecans  . Pineapple   . Carmie Kanner [Fish Allergy]     And tuna and oysters    History   Social History  . Marital Status: Married    Spouse Name: N/A    Number of Children: N/A  . Years of Education: N/A   Occupational History  . Not on file.   Social History Main Topics  . Smoking  status: Former Smoker    Quit date: 02/28/2002  . Smokeless tobacco: Not on file  . Alcohol Use: Not on file  . Drug Use: Not on file  . Sexual Activity: Not on file   Other Topics Concern  . Not on file   Social History Narrative  . No narrative on file    History reviewed. No pertinent family history.   Review of Systems:  See HPI for pertinent ROS. All other ROS negative.    Physical Exam: Blood pressure 136/68, pulse 64, temperature 97.7 F (36.5 C), temperature source Oral, resp. rate 18, weight 128 lb (58.06 kg)., Body mass index is 25.84 kg/(m^2). General: Very pleasant WF. Appears in no acute distress. Neck: Supple. No thyromegaly. No lymphadenopathy. Lungs: Clear bilaterally to auscultation without wheezes, rales, or rhonchi. Breathing is unlabored. Heart: RRR with S1 S2. No murmurs, rubs, or gallops. Abdomen: Soft, non-tender, non-distended with normoactive bowel sounds. No hepatomegaly. No rebound/guarding. No obvious abdominal masses. Musculoskeletal:  Strength and tone normal for age. Extremities/Skin: Warm and dry.  Neuro: Alert and oriented X 3. Moves all extremities spontaneously. Gait is normal. CNII-XII grossly in tact. Psych:  Responds to questions appropriately with a normal affect.     ASSESSMENT AND PLAN:  68 y.o. year old female with  1. Anxiety Well controlled with Celexa 40mg .  2. Carotid artery disease Per Dr. Allyson Sabal  3. GERD (gastroesophageal reflux disease) Well-controlled with her protonic but she does have to take this every day in order to control symptoms  4. HTN (hypertension) Blood pressure is at goal. Continue current medications and check lab to monitor. - COMPLETE METABOLIC PANEL WITH GFR  5. Hyperlipemia She is fasting. She is on Lipitor.  - COMPLETE METABOLIC PANEL WITH GFR - Lipid panel  6. Hypothyroidism On levothyroxine 50 mcg daily - TSH  7. Peripheral vascular disease with claudication Per Dr. Allyson Sabal. Will check CBC  as she is on ASA, Plavix - Lipid panel - CBC with Differential  8. Subclavian artery stenosis, left Per Dr. Allyson Sabal  9. Vitamin D deficiency She is taking 2000 units daily - Vit D  25 hydroxy (rtn osteoporosis monitoring)  10. Preventive health care: I have discussed mammogram/pelvic exam/Pap smear/Dexa/colonoscopy/immunizations with her multiple times in the past and again today. She still defers. She is aware of risk and benefits of each procedure but still refuses.  Regular office visit and labs in 6 months or sooner if needed.   [address] [place], [place], [facility] 03/28/2013  1:26 PM

## 2013-03-29 LAB — CBC WITH DIFFERENTIAL/PLATELET
BASOS ABS: 0 10*3/uL (ref 0.0–0.1)
Basophils Relative: 1 % (ref 0–1)
EOS ABS: 0.2 10*3/uL (ref 0.0–0.7)
EOS PCT: 4 % (ref 0–5)
HCT: 39.8 % (ref 36.0–46.0)
Hemoglobin: 13.2 g/dL (ref 12.0–15.0)
Lymphocytes Relative: 32 % (ref 12–46)
Lymphs Abs: 1.9 10*3/uL (ref 0.7–4.0)
MCH: 31.5 pg (ref 26.0–34.0)
MCHC: 33.2 g/dL (ref 30.0–36.0)
MCV: 95 fL (ref 78.0–100.0)
Monocytes Absolute: 0.5 10*3/uL (ref 0.1–1.0)
Monocytes Relative: 8 % (ref 3–12)
Neutro Abs: 3.3 10*3/uL (ref 1.7–7.7)
Neutrophils Relative %: 55 % (ref 43–77)
Platelets: 294 10*3/uL (ref 150–400)
RBC: 4.19 MIL/uL (ref 3.87–5.11)
RDW: 14.2 % (ref 11.5–15.5)
WBC: 6 10*3/uL (ref 4.0–10.5)

## 2013-03-29 LAB — COMPLETE METABOLIC PANEL WITH GFR
ALBUMIN: 4.6 g/dL (ref 3.5–5.2)
ALK PHOS: 80 U/L (ref 39–117)
ALT: 11 U/L (ref 0–35)
AST: 17 U/L (ref 0–37)
BUN: 8 mg/dL (ref 6–23)
CALCIUM: 9.5 mg/dL (ref 8.4–10.5)
CHLORIDE: 99 meq/L (ref 96–112)
CO2: 32 mEq/L (ref 19–32)
Creat: 0.66 mg/dL (ref 0.50–1.10)
GFR, Est African American: >89 mL/min
GFR, Est Non African American: >89 mL/min
GLUCOSE: 75 mg/dL (ref 70–99)
POTASSIUM: 4 meq/L (ref 3.5–5.3)
SODIUM: 140 meq/L (ref 135–145)
TOTAL PROTEIN: 7.4 g/dL (ref 6.0–8.3)
Total Bilirubin: 0.4 mg/dL (ref 0.2–1.2)

## 2013-03-29 LAB — LIPID PANEL
CHOL/HDL RATIO: 3.4 ratio
CHOLESTEROL: 174 mg/dL (ref 0–200)
HDL: 51 mg/dL (ref >39)
LDL Cholesterol: 88 mg/dL (ref 0–99)
Triglycerides: 177 mg/dL — ABNORMAL HIGH (ref <150)
VLDL: 35 mg/dL (ref 0–40)

## 2013-03-29 LAB — VITAMIN D 25 HYDROXY (VIT D DEFICIENCY, FRACTURES): VIT D 25 HYDROXY: 71 ng/mL (ref 30–89)

## 2013-03-29 LAB — TSH: TSH: 4.788 u[IU]/mL — AB (ref 0.350–4.500)

## 2013-04-02 ENCOUNTER — Telehealth: Payer: Self-pay | Admitting: Family Medicine

## 2013-04-02 DIAGNOSIS — E785 Hyperlipidemia, unspecified: Secondary | ICD-10-CM

## 2013-04-02 DIAGNOSIS — I10 Essential (primary) hypertension: Secondary | ICD-10-CM

## 2013-04-02 DIAGNOSIS — E039 Hypothyroidism, unspecified: Secondary | ICD-10-CM

## 2013-04-02 DIAGNOSIS — F419 Anxiety disorder, unspecified: Secondary | ICD-10-CM

## 2013-04-02 MED ORDER — CITALOPRAM HYDROBROMIDE 40 MG PO TABS: 40.0000 mg | ORAL_TABLET | Freq: Every day | ORAL | Status: DC

## 2013-04-02 MED ORDER — LEVOTHYROXINE SODIUM 75 MCG PO TABS: 75.0000 ug | ORAL_TABLET | Freq: Every day | ORAL | Status: DC

## 2013-04-02 MED ORDER — ATORVASTATIN CALCIUM 10 MG PO TABS: 10.0000 mg | ORAL_TABLET | Freq: Every day | ORAL | Status: DC

## 2013-04-02 MED ORDER — VALSARTAN 80 MG PO TABS: 80.0000 mg | ORAL_TABLET | Freq: Every day | ORAL | Status: DC

## 2013-04-02 NOTE — Telephone Encounter (Signed)
Pt made aware of lab results.  Understands change in thyroid medication and need for repeat labs in 6 weeks

## 2013-04-02 NOTE — Telephone Encounter (Signed)
Message copied by Donne AnonPLUMMER, Malala Trenkamp M on Tue Apr 02, 2013 11:22 AM ------      Message from: Allayne ButcherIXON, MARY      Created: Fri Mar 29, 2013  6:53 AM       Tell patient that her thyroid number is just slightly off. Will need to adjust that dose.      Tell her that the rest of the labs are excellent. Continue all other medications the same as at present.      Currently on levothyroxine 50      Increase to levothyroxine 75 mcg daily      Recheck TSH 6 weeks ------

## 2013-04-02 NOTE — Telephone Encounter (Signed)
Medication refilled per protocol. 

## 2013-04-09 ENCOUNTER — Telehealth: Payer: Self-pay | Admitting: Family Medicine

## 2013-04-09 DIAGNOSIS — I10 Essential (primary) hypertension: Secondary | ICD-10-CM

## 2013-04-09 MED ORDER — BENAZEPRIL HCL 40 MG PO TABS: 40.0000 mg | ORAL_TABLET | Freq: Every day | ORAL | Status: DC

## 2013-04-09 NOTE — Telephone Encounter (Signed)
Medication refilled per protocol. 

## 2013-04-10 ENCOUNTER — Telehealth: Payer: Self-pay | Admitting: Family Medicine

## 2013-04-10 DIAGNOSIS — K219 Gastro-esophageal reflux disease without esophagitis: Secondary | ICD-10-CM

## 2013-04-10 MED ORDER — PANTOPRAZOLE SODIUM 40 MG PO TBEC: 40.0000 mg | DELAYED_RELEASE_TABLET | Freq: Every day | ORAL | Status: DC

## 2013-04-10 NOTE — Telephone Encounter (Signed)
Medication refilled per protocol. 

## 2013-04-24 ENCOUNTER — Telehealth: Payer: Self-pay | Admitting: Family Medicine

## 2013-04-24 DIAGNOSIS — I10 Essential (primary) hypertension: Secondary | ICD-10-CM

## 2013-04-24 MED ORDER — AMLODIPINE BESYLATE 10 MG PO TABS: 10.0000 mg | ORAL_TABLET | Freq: Every day | ORAL | Status: DC

## 2013-04-24 NOTE — Telephone Encounter (Signed)
Medication refilled per protocol. 

## 2013-05-17 IMAGING — CR DG CHEST 2V
2 series · 2 of 2 positions shown · non-contrast
Comparison: None

CLINICAL DATA: Preop chest exam

CHEST - 2 VIEW

[w chest pa]
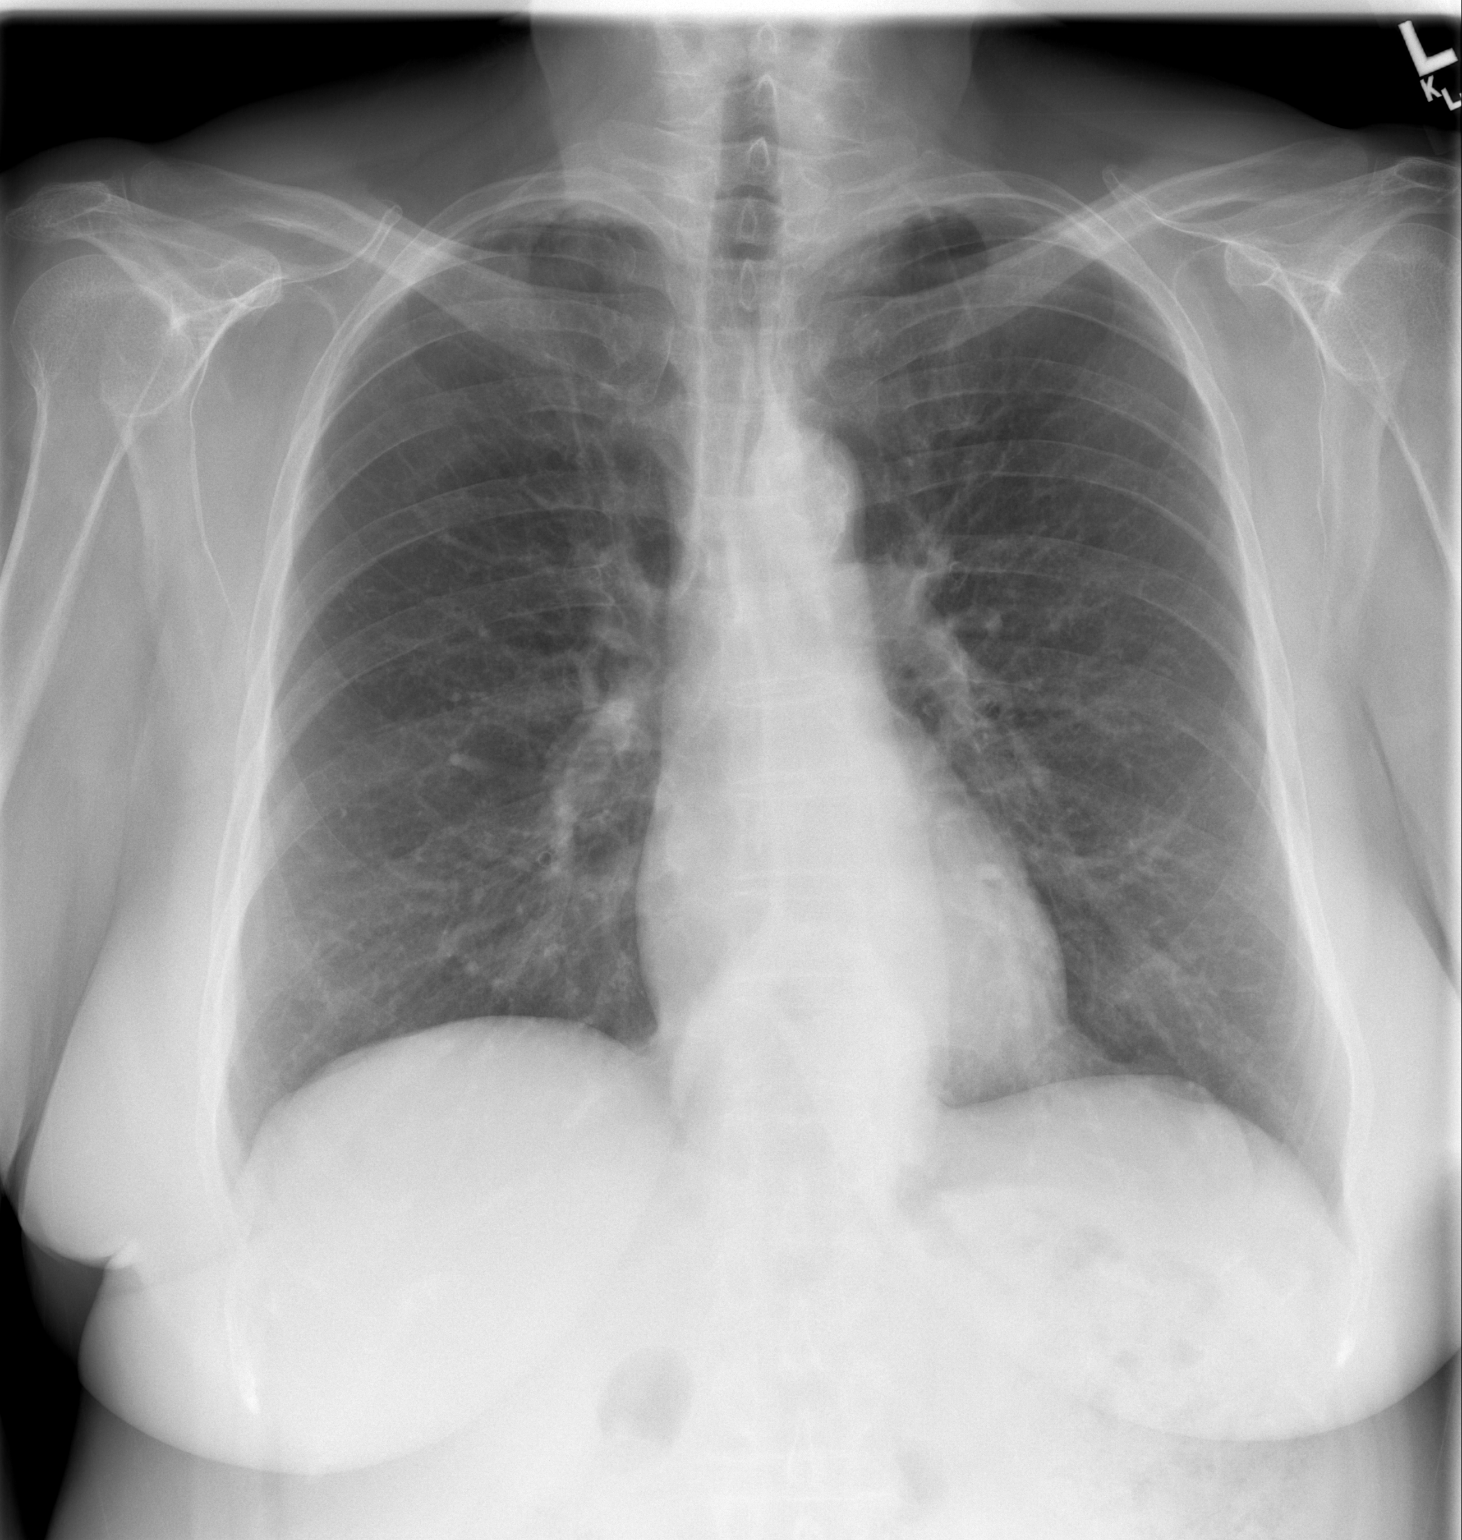

[w chest lat]
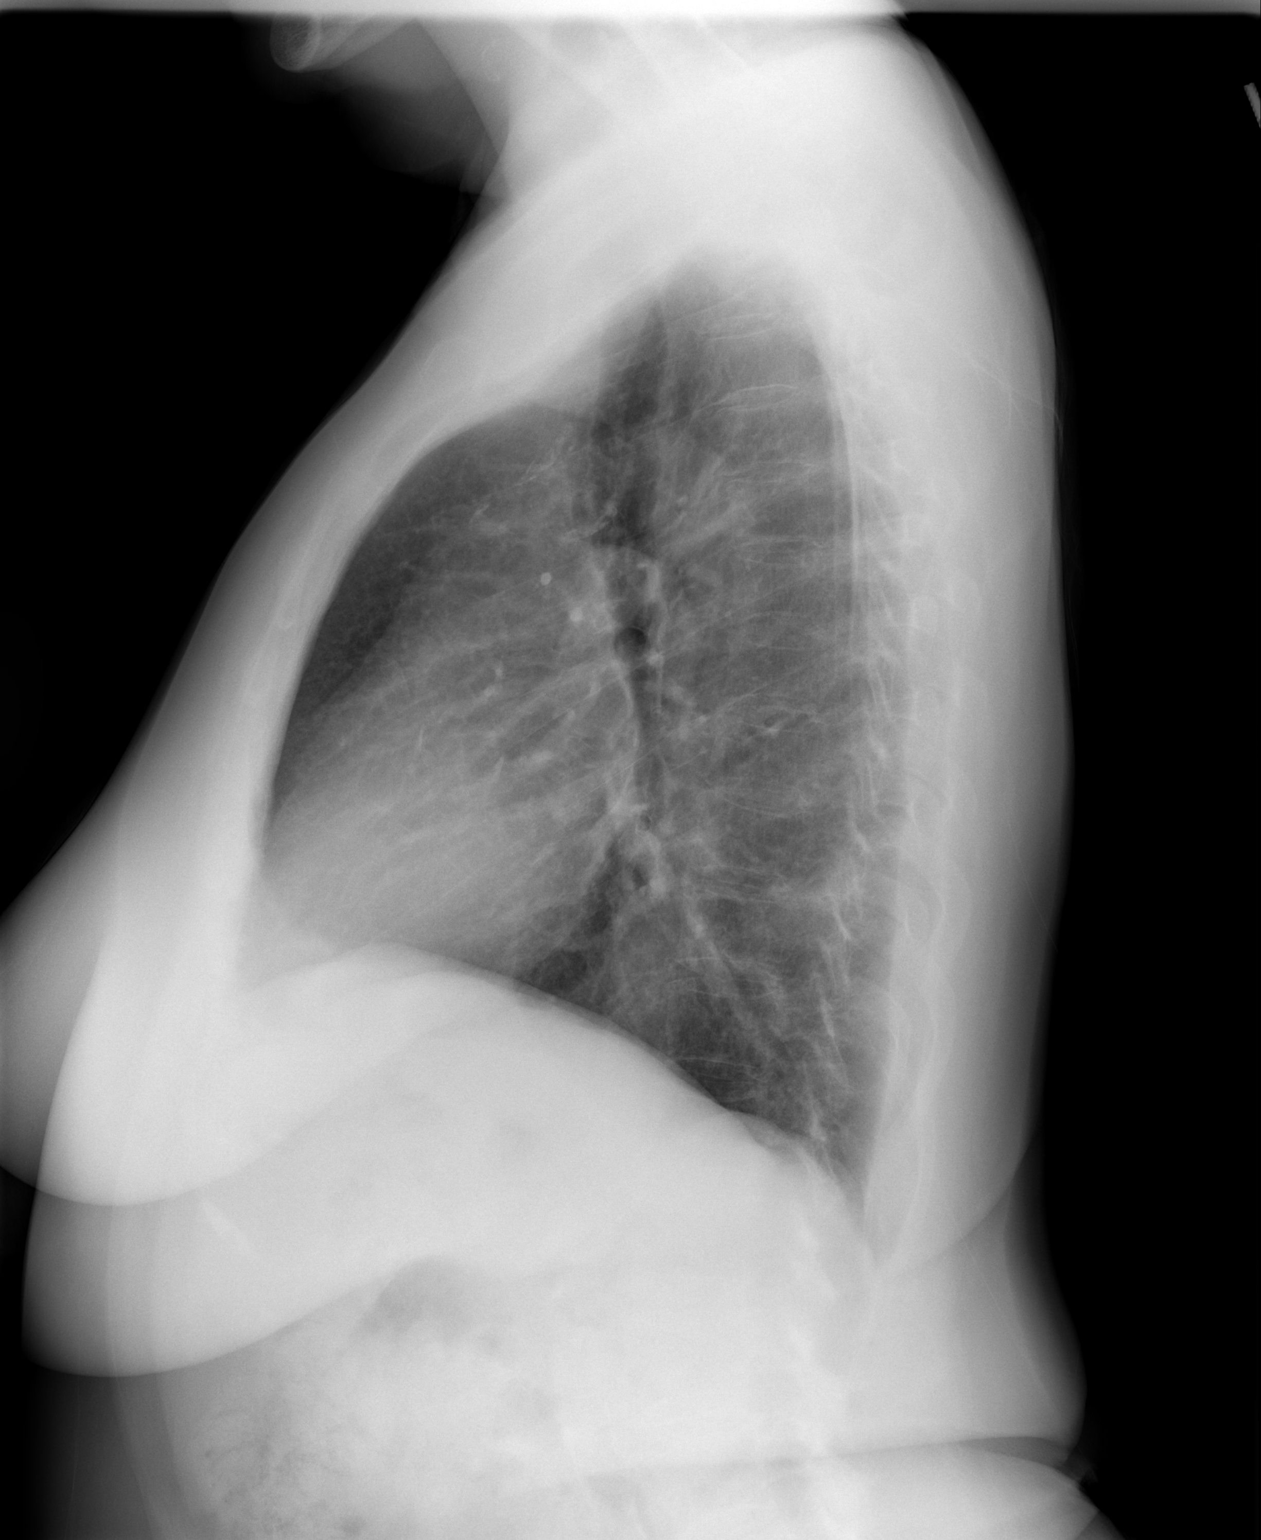

[2 of 2 positions shown; findings below may reference images not displayed]

FINDINGS: Heart size and mediastinal contours appear normal.

There is a small hiatal hernia identified.

There is no airspace consolidation identified.

Review of the visualized osseous structures is unremarkable.
IMPRESSION: 1.  No active cardiopulmonary abnormalities.
2.  Hiatal hernia.

## 2013-05-30 ENCOUNTER — Telehealth: Payer: Self-pay | Admitting: Family Medicine

## 2013-05-30 DIAGNOSIS — E039 Hypothyroidism, unspecified: Secondary | ICD-10-CM

## 2013-05-30 NOTE — Telephone Encounter (Signed)
Received request for refill of thyroid medication.  Pt due for 6 week follow up TSH after dose change.  I called patient and she says will come tomorrow for blood work.

## 2013-05-31 ENCOUNTER — Other Ambulatory Visit: Payer: Medicare Other

## 2013-05-31 DIAGNOSIS — E039 Hypothyroidism, unspecified: Secondary | ICD-10-CM | POA: Diagnosis not present

## 2013-05-31 LAB — TSH: TSH: 0.427 u[IU]/mL (ref 0.350–4.500)

## 2013-06-04 MED ORDER — LEVOTHYROXINE SODIUM 75 MCG PO TABS: 75.0000 ug | ORAL_TABLET | Freq: Every day | ORAL | Status: DC

## 2013-06-04 NOTE — Telephone Encounter (Signed)
Labs done and are normal.  RF x 6 months to pharmacy per provider

## 2013-07-10 DIAGNOSIS — H04129 Dry eye syndrome of unspecified lacrimal gland: Secondary | ICD-10-CM | POA: Diagnosis not present

## 2013-07-31 DIAGNOSIS — H26499 Other secondary cataract, unspecified eye: Secondary | ICD-10-CM | POA: Diagnosis not present

## 2013-08-09 DIAGNOSIS — Z961 Presence of intraocular lens: Secondary | ICD-10-CM | POA: Diagnosis not present

## 2013-08-09 DIAGNOSIS — H26499 Other secondary cataract, unspecified eye: Secondary | ICD-10-CM | POA: Diagnosis not present

## 2013-08-13 ENCOUNTER — Other Ambulatory Visit: Payer: Self-pay

## 2013-08-13 MED ORDER — CLOPIDOGREL BISULFATE 75 MG PO TABS: 75.0000 mg | ORAL_TABLET | Freq: Every day | ORAL | Status: DC

## 2013-08-13 NOTE — Telephone Encounter (Signed)
Rx was sent to pharmacy electronically. 

## 2013-08-20 DIAGNOSIS — H26499 Other secondary cataract, unspecified eye: Secondary | ICD-10-CM | POA: Diagnosis not present

## 2013-08-20 DIAGNOSIS — Z961 Presence of intraocular lens: Secondary | ICD-10-CM | POA: Diagnosis not present

## 2013-08-26 ENCOUNTER — Other Ambulatory Visit: Payer: Self-pay | Admitting: *Deleted

## 2013-08-26 ENCOUNTER — Encounter: Payer: Self-pay | Admitting: Family Medicine

## 2013-08-26 ENCOUNTER — Telehealth: Payer: Self-pay | Admitting: Family Medicine

## 2013-08-26 DIAGNOSIS — F419 Anxiety disorder, unspecified: Secondary | ICD-10-CM

## 2013-08-26 MED ORDER — NEBIVOLOL HCL 10 MG PO TABS: 10.0000 mg | ORAL_TABLET | Freq: Every day | ORAL | Status: DC

## 2013-08-26 MED ORDER — CITALOPRAM HYDROBROMIDE 40 MG PO TABS: 40.0000 mg | ORAL_TABLET | Freq: Every day | ORAL | Status: DC

## 2013-08-26 NOTE — Telephone Encounter (Signed)
Medication refill for one time only.  Patient needs to be seen.  Letter sent for patient to call and schedule 

## 2013-09-09 ENCOUNTER — Other Ambulatory Visit: Payer: Self-pay | Admitting: Family Medicine

## 2013-09-09 MED ORDER — MONTELUKAST SODIUM 10 MG PO TABS: 10.0000 mg | ORAL_TABLET | Freq: Every day | ORAL | Status: DC

## 2013-09-09 NOTE — Telephone Encounter (Signed)
Med sent to pharm 

## 2013-09-18 ENCOUNTER — Other Ambulatory Visit: Payer: Self-pay | Admitting: *Deleted

## 2013-09-18 MED ORDER — CLOPIDOGREL BISULFATE 75 MG PO TABS: 75.0000 mg | ORAL_TABLET | Freq: Every day | ORAL | Status: DC

## 2013-09-18 NOTE — Telephone Encounter (Signed)
Rx refill sent to patient refill

## 2013-09-25 ENCOUNTER — Encounter: Payer: Self-pay | Admitting: Physician Assistant

## 2013-09-25 ENCOUNTER — Ambulatory Visit: Payer: BLUE CROSS/BLUE SHIELD | Admitting: Physician Assistant

## 2013-09-25 ENCOUNTER — Ambulatory Visit (INDEPENDENT_AMBULATORY_CARE_PROVIDER_SITE_OTHER): Payer: Medicare Other | Admitting: Physician Assistant

## 2013-09-25 VITALS — BP 132/60 | HR 60 | Temp 97.3°F | Resp 18 | Wt 125.0 lb

## 2013-09-25 DIAGNOSIS — E038 Other specified hypothyroidism: Secondary | ICD-10-CM

## 2013-09-25 DIAGNOSIS — I779 Disorder of arteries and arterioles, unspecified: Secondary | ICD-10-CM | POA: Diagnosis not present

## 2013-09-25 DIAGNOSIS — F419 Anxiety disorder, unspecified: Secondary | ICD-10-CM

## 2013-09-25 DIAGNOSIS — F411 Generalized anxiety disorder: Secondary | ICD-10-CM

## 2013-09-25 DIAGNOSIS — I739 Peripheral vascular disease, unspecified: Secondary | ICD-10-CM

## 2013-09-25 DIAGNOSIS — I708 Atherosclerosis of other arteries: Secondary | ICD-10-CM | POA: Diagnosis not present

## 2013-09-25 DIAGNOSIS — I771 Stricture of artery: Secondary | ICD-10-CM

## 2013-09-25 DIAGNOSIS — E785 Hyperlipidemia, unspecified: Secondary | ICD-10-CM | POA: Diagnosis not present

## 2013-09-25 DIAGNOSIS — I10 Essential (primary) hypertension: Secondary | ICD-10-CM

## 2013-09-25 DIAGNOSIS — E559 Vitamin D deficiency, unspecified: Secondary | ICD-10-CM

## 2013-09-25 DIAGNOSIS — K219 Gastro-esophageal reflux disease without esophagitis: Secondary | ICD-10-CM

## 2013-09-25 LAB — COMPLETE METABOLIC PANEL WITH GFR
ALK PHOS: 74 U/L (ref 39–117)
ALT: 11 U/L (ref 0–35)
AST: 15 U/L (ref 0–37)
Albumin: 4.7 g/dL (ref 3.5–5.2)
BILIRUBIN TOTAL: 0.4 mg/dL (ref 0.2–1.2)
BUN: 11 mg/dL (ref 6–23)
CO2: 31 mEq/L (ref 19–32)
CREATININE: 0.85 mg/dL (ref 0.50–1.10)
Calcium: 10 mg/dL (ref 8.4–10.5)
Chloride: 101 mEq/L (ref 96–112)
GFR, Est African American: 81 mL/min
GFR, Est Non African American: 70 mL/min
Glucose, Bld: 81 mg/dL (ref 70–99)
Potassium: 4.8 mEq/L (ref 3.5–5.3)
Sodium: 142 mEq/L (ref 135–145)
Total Protein: 7.7 g/dL (ref 6.0–8.3)

## 2013-09-25 LAB — CBC WITH DIFFERENTIAL/PLATELET
BASOS ABS: 0 10*3/uL (ref 0.0–0.1)
BASOS PCT: 0 % (ref 0–1)
EOS PCT: 4 % (ref 0–5)
Eosinophils Absolute: 0.2 10*3/uL (ref 0.0–0.7)
HCT: 42 % (ref 36.0–46.0)
Hemoglobin: 13.9 g/dL (ref 12.0–15.0)
Lymphocytes Relative: 27 % (ref 12–46)
Lymphs Abs: 1.5 10*3/uL (ref 0.7–4.0)
MCH: 31.7 pg (ref 26.0–34.0)
MCHC: 33.1 g/dL (ref 30.0–36.0)
MCV: 95.7 fL (ref 78.0–100.0)
Monocytes Absolute: 0.5 10*3/uL (ref 0.1–1.0)
Monocytes Relative: 9 % (ref 3–12)
Neutro Abs: 3.3 10*3/uL (ref 1.7–7.7)
Neutrophils Relative %: 60 % (ref 43–77)
Platelets: 311 10*3/uL (ref 150–400)
RBC: 4.39 MIL/uL (ref 3.87–5.11)
RDW: 13.5 % (ref 11.5–15.5)
WBC: 5.5 10*3/uL (ref 4.0–10.5)

## 2013-09-25 LAB — LIPID PANEL
CHOL/HDL RATIO: 2.9 ratio
CHOLESTEROL: 139 mg/dL (ref 0–200)
HDL: 48 mg/dL (ref >39)
LDL CALC: 48 mg/dL (ref 0–99)
TRIGLYCERIDES: 215 mg/dL — AB (ref <150)
VLDL: 43 mg/dL — AB (ref 0–40)

## 2013-09-25 LAB — TSH: TSH: 0.851 u[IU]/mL (ref 0.350–4.500)

## 2013-09-25 NOTE — Progress Notes (Signed)
Patient ID: Alexis [name] MRN: [id number], DOB: Sep 30, 1945, 68 y.o. Date of Encounter: @DATE @  Chief Complaint:  Chief Complaint  Patient presents with  . Medication Refill    is fasting    HPI: 68 y.o. year old white female  presents a routine followup office visit.  She has no complaints today. Says that she's been feeling very well. Says she has continued to followup with Dr. Allyson SabalBerry regarding her PVD and has been having routine ultrasound tests to follow this also. She is scheduled for ultrasound test on October 08 2013 and has appointment with Dr. Allyson SabalBerry November 01 2013  GERD: She states that she does have to take her protonix every day or else she has heartburn symptoms. However with her protonix her symptoms are controlled.  Generalized anxiety disorder:   She is on Celexa 40 mg. She states this is working well and is controlling her anxiety very well. She is having no adverse effects.  Vitamin D deficiency: She is taking 2000 units daily.  Thyroid: She is taking the prescription 75 mcg daily. No change in her hair or skin and no weight changes or palpitations. (TSH was high at lab 03/28/2013--dose was increased from 50mcg to 75 mg at that time. She had followup TSH  05/31/13, which was normal.)  No chest pressure, heaviness, tightness, squeezing even with exertion. No increased shortness of breath/dyspnea on exertion.   Past Medical History  Diagnosis Date  . Tobacco abuse   . Claudication     stent in bil iliac 11/25/10  . PVD (peripheral vascular disease)     lower ext and upper ext-L subclavian stenosis by ultrasound, nl nuclear test- 10/27/10  . Murmur, cardiac     echo 10/12/10- EF >55%, mod sclerotic aortic valve  . Hyperlipemia   . HTN (hypertension)   . Anxiety   . Hypothyroidism   . GERD (gastroesophageal reflux disease)   . Vitamin D deficiency      Home Meds:  Outpatient Prescriptions Prior to Visit  Medication Sig Dispense Refill  . albuterol  (PROVENTIL HFA;VENTOLIN HFA) 108 (90 BASE) MCG/ACT inhaler Inhale 2 puffs into the lungs every 6 (six) hours as needed for wheezing.      Marland Kitchen. amLODipine (NORVASC) 10 MG tablet Take 1 tablet (10 mg total) by mouth daily.  30 tablet  4  . aspirin 81 MG tablet Take 81 mg by mouth daily.      Marland Kitchen. atorvastatin (LIPITOR) 10 MG tablet Take 1 tablet (10 mg total) by mouth daily.  30 tablet  5  . benazepril (LOTENSIN) 40 MG tablet Take 1 tablet (40 mg total) by mouth daily.  30 tablet  5  . cholecalciferol (VITAMIN D) 1000 UNITS tablet Take 2,000 Units by mouth daily.      . citalopram (CELEXA) 40 MG tablet Take 1 tablet (40 mg total) by mouth daily.  30 tablet  0  . clopidogrel (PLAVIX) 75 MG tablet Take 1 tablet (75 mg total) by mouth daily.  30 tablet  1  . diphenhydrAMINE (BENADRYL) 25 mg capsule Take 25 mg by mouth. 2 tabs at night      . levothyroxine (SYNTHROID, LEVOTHROID) 75 MCG tablet Take 1 tablet (75 mcg total) by mouth daily.  30 tablet  6  . montelukast (SINGULAIR) 10 MG tablet Take 1 tablet (10 mg total) by mouth at bedtime.  30 tablet  11  . nebivolol (BYSTOLIC) 10 MG tablet Take 1 tablet (10 mg total) by mouth  daily. Need appointment before anymore refills  30 tablet  1  . pantoprazole (PROTONIX) 40 MG tablet Take 1 tablet (40 mg total) by mouth daily.  30 tablet  6  . valsartan (DIOVAN) 80 MG tablet Take 1 tablet (80 mg total) by mouth daily.  30 tablet  5   No facility-administered medications prior to visit.     Allergies:  Allergies  Allergen Reactions  . Banana   . Chicken Allergy   . Demerol [Meperidine]   . Eggs Or Egg-Derived Products   . Metoprolol     asthma  . Milk-Related Compounds   . Orange Fruit [Citrus]   . Other     [place], pecans  . Pineapple   . Carmie Kanner [Fish Allergy]     And tuna and oysters    History   Social History  . Marital Status: Married    Spouse Name: N/A    Number of Children: N/A  . Years of Education: N/A   Occupational History  . Not  on file.   Social History Main Topics  . Smoking status: Former Smoker    Quit date: 02/28/2002  . Smokeless tobacco: Never Used  . Alcohol Use: Not on file  . Drug Use: Not on file  . Sexual Activity: Not on file   Other Topics Concern  . Not on file   Social History Narrative  . No narrative on file    History reviewed. No pertinent family history.   Review of Systems:  See HPI for pertinent ROS. All other ROS negative.    Physical Exam: Blood pressure 132/60, pulse 60, temperature 97.3 F (36.3 C), temperature source Oral, resp. rate 18, weight 125 lb (56.7 kg)., Body mass index is 25.23 kg/(m^2). General: Very pleasant WF. Appears in no acute distress. Neck: Supple. No thyromegaly. No lymphadenopathy. Lungs: Clear bilaterally to auscultation without wheezes, rales, or rhonchi. Breathing is unlabored. Heart: RRR with S1 S2. No murmurs, rubs, or gallops. Abdomen: Soft, non-tender, non-distended with normoactive bowel sounds. No hepatomegaly. No rebound/guarding. No obvious abdominal masses. Musculoskeletal:  Strength and tone normal for age. Extremities/Skin: Warm and dry. No LE edema.  Neuro: Alert and oriented X 3. Moves all extremities spontaneously. Gait is normal. CNII-XII grossly in tact. Psych:  Responds to questions appropriately with a normal affect.     ASSESSMENT AND PLAN:  68 y.o. year old female with  1. Anxiety Well controlled with Celexa 40mg .  2. Carotid artery disease Per Dr. Allyson Sabal On ACE inhibitor, statin, Plavix, aspirin 81 mg  3. GERD (gastroesophageal reflux disease) Well-controlled with her protonix but she does have to take this every day in order to control symptoms  4. HTN (hypertension) Blood pressure is at goal. Continue current medications and check lab to monitor. - COMPLETE METABOLIC PANEL WITH GFR  5. Hyperlipemia She is fasting. She is on Lipitor.  - COMPLETE METABOLIC PANEL WITH GFR - Lipid panel  6. Hypothyroidism On  levothyroxine 75 mcg daily - TSH  7. Peripheral vascular disease with claudication Per Dr. Allyson Sabal. Will check CBC as she is on ASA, Plavix - Lipid panel - CBC with Differential  8. Subclavian artery stenosis, left Per Dr. Allyson Sabal  9. Vitamin D deficiency She is taking 2000 units daily - Vit D  25 hydroxy (rtn osteoporosis monitoring)  10. Preventive health care: I have discussed mammogram/pelvic exam/Pap smear/Dexa/colonoscopy/immunizations with her multiple times in the past and again today. She still defers. She is aware of risk and benefits  of each procedure but still refuses.  Regular office visit and labs in 6 months or sooner if needed.   Signed, [address] [place], [place], [facility] 09/25/2013 11:27 AM

## 2013-09-26 ENCOUNTER — Encounter: Payer: Self-pay | Admitting: *Deleted

## 2013-09-27 ENCOUNTER — Telehealth: Payer: Self-pay | Admitting: Family Medicine

## 2013-09-27 DIAGNOSIS — E785 Hyperlipidemia, unspecified: Secondary | ICD-10-CM

## 2013-09-27 DIAGNOSIS — I10 Essential (primary) hypertension: Secondary | ICD-10-CM

## 2013-09-27 MED ORDER — ALBUTEROL SULFATE HFA 108 (90 BASE) MCG/ACT IN AERS: 2.0000 | INHALATION_SPRAY | Freq: Four times a day (QID) | RESPIRATORY_TRACT | Status: DC | PRN

## 2013-09-27 MED ORDER — AMLODIPINE BESYLATE 10 MG PO TABS: 10.0000 mg | ORAL_TABLET | Freq: Every day | ORAL | Status: DC

## 2013-09-27 MED ORDER — ATORVASTATIN CALCIUM 10 MG PO TABS: 10.0000 mg | ORAL_TABLET | Freq: Every day | ORAL | Status: DC

## 2013-09-27 NOTE — Telephone Encounter (Signed)
Medication refilled per protocol. 

## 2013-10-08 ENCOUNTER — Ambulatory Visit (HOSPITAL_COMMUNITY)
Admission: RE | Admit: 2013-10-08 | Discharge: 2013-10-08 | Disposition: A | Discharge status: Home / Self Care | Payer: Medicare Other | Source: Ambulatory Visit | Attending: Cardiology | Admitting: Cardiology

## 2013-10-08 ENCOUNTER — Ambulatory Visit (HOSPITAL_BASED_OUTPATIENT_CLINIC_OR_DEPARTMENT_OTHER)
Admission: RE | Admit: 2013-10-08 | Discharge: 2013-10-08 | Disposition: A | Discharge status: Home / Self Care | Payer: Medicare Other | Source: Ambulatory Visit | Attending: Cardiology | Admitting: Cardiology

## 2013-10-08 ENCOUNTER — Ambulatory Visit (HOSPITAL_BASED_OUTPATIENT_CLINIC_OR_DEPARTMENT_OTHER)
Admission: RE | Admit: 2013-10-08 | Discharge: 2013-10-08 | Disposition: A | Discharge status: Home / Self Care | Payer: Medicare Other | Source: Ambulatory Visit | Attending: Cardiovascular Disease | Admitting: Cardiovascular Disease

## 2013-10-08 DIAGNOSIS — I739 Peripheral vascular disease, unspecified: Secondary | ICD-10-CM

## 2013-10-08 DIAGNOSIS — I779 Disorder of arteries and arterioles, unspecified: Secondary | ICD-10-CM | POA: Diagnosis not present

## 2013-10-08 DIAGNOSIS — G458 Other transient cerebral ischemic attacks and related syndromes: Secondary | ICD-10-CM

## 2013-10-08 DIAGNOSIS — I70219 Atherosclerosis of native arteries of extremities with intermittent claudication, unspecified extremity: Secondary | ICD-10-CM | POA: Diagnosis not present

## 2013-10-08 DIAGNOSIS — I6529 Occlusion and stenosis of unspecified carotid artery: Secondary | ICD-10-CM | POA: Diagnosis not present

## 2013-10-08 NOTE — Progress Notes (Signed)
Upper Ext. Arterial Duplex Completed. Trudi Morgenthaler, BS, RDMS, RVT  

## 2013-10-08 NOTE — Progress Notes (Signed)
Arterial Duplex Lower Ext. Completed. Gordy Goar, BS, RDMS, RVT  

## 2013-10-08 NOTE — Progress Notes (Signed)
Carotid Duplex Completed. Alexis [name], BS, RDMS, RVT  

## 2013-10-10 ENCOUNTER — Telehealth: Payer: Self-pay | Admitting: *Deleted

## 2013-10-10 DIAGNOSIS — I10 Essential (primary) hypertension: Secondary | ICD-10-CM

## 2013-10-10 MED ORDER — BENAZEPRIL HCL 40 MG PO TABS: 40.0000 mg | ORAL_TABLET | Freq: Every day | ORAL | Status: DC

## 2013-10-10 NOTE — Telephone Encounter (Signed)
benezepril 40mg  take one tablet by mouth daily #30  Medication refilled

## 2013-10-17 ENCOUNTER — Telehealth: Payer: Self-pay | Admitting: *Deleted

## 2013-10-17 DIAGNOSIS — I739 Peripheral vascular disease, unspecified: Secondary | ICD-10-CM

## 2013-10-17 NOTE — Telephone Encounter (Signed)
Order placed for repeat UE doppler in 1 year

## 2013-10-17 NOTE — Telephone Encounter (Signed)
Message copied by Ka Bench, KATHMarella BileYN W. on Thu Oct 17, 2013 12:25 PM ------      Message from: Runell GessBERRY, JONATHAN J      Created: Tue Oct 15, 2013  8:53 PM       Over due for a ROV ------

## 2013-11-01 ENCOUNTER — Encounter: Payer: Self-pay | Admitting: Cardiovascular Disease

## 2013-11-01 ENCOUNTER — Other Ambulatory Visit: Payer: Self-pay | Admitting: *Deleted

## 2013-11-01 ENCOUNTER — Ambulatory Visit (INDEPENDENT_AMBULATORY_CARE_PROVIDER_SITE_OTHER): Payer: Medicare Other | Admitting: Cardiovascular Disease

## 2013-11-01 VITALS — BP 126/60 | HR 58 | Ht 60.0 in | Wt 126.6 lb

## 2013-11-01 DIAGNOSIS — E785 Hyperlipidemia, unspecified: Secondary | ICD-10-CM

## 2013-11-01 DIAGNOSIS — I779 Disorder of arteries and arterioles, unspecified: Secondary | ICD-10-CM

## 2013-11-01 DIAGNOSIS — I10 Essential (primary) hypertension: Secondary | ICD-10-CM

## 2013-11-01 DIAGNOSIS — I739 Peripheral vascular disease, unspecified: Secondary | ICD-10-CM

## 2013-11-01 MED ORDER — VALSARTAN 80 MG PO TABS: 80.0000 mg | ORAL_TABLET | Freq: Every day | ORAL | Status: DC

## 2013-11-01 NOTE — Assessment & Plan Note (Signed)
History of bilateral iliac stenting by myself 11/25/10. We have been getting lower extremity duplex ultrasounds which were done last month revealing patent stents. She denies claudication.

## 2013-11-01 NOTE — Assessment & Plan Note (Signed)
Moderate carotid artery disease bilaterally followed by duplex ultrasound which has remained stable as recently as last month. She is neurologically asymptomatic

## 2013-11-01 NOTE — Patient Instructions (Signed)
Your physician wants you to follow-up in: 1 year with Dr Berry. You will receive a reminder letter in the mail two months in advance. If you don't receive a letter, please call our office to schedule the follow-up appointment.  

## 2013-11-01 NOTE — Progress Notes (Signed)
11/01/2013 Rocco Pauls   05/27/1945  [id number]  Primary Physician Frazier Richards, PA-C Primary Cardiologist: Runell Gess MD Alexis [name]   HPI:  She is a 68 year old thin-appearing, married Caucasian female, mother of 1, grandmother to 3 grandchildren who was referred to me for claudication. She has a history of hypertension, hyperlipidemia as well as remote tobacco abuse. She has symptomatic claudication. I stented both of her iliac arteries November 25, 2010, resulting in marked improvement in her ability to ambulate, as well as her Doppler studies. She also has evidence of a left subclavian artery stenosis by duplex ultrasound and symptoms. Her most recent Doppler studies performed January 10, 2012, did show moderate "in-stent restenosis" within the left-greater-than-right common iliac artery stent with ABIs of 0.93 on the right and 0.86 on the left. She has noticed some moderate decrement in her ability to ambulate. Her lipid profile is followed by her PCP. She also has mild to moderate carotid disease and is neurologically asymptomatic. Since I saw her last she denies chest pain shortness of breath or claudication. She does complain of some left upper extremity claudication which really hasn't changed in frequency or severity.      Current Outpatient Prescriptions  Medication Sig Dispense Refill  . albuterol (PROVENTIL HFA;VENTOLIN HFA) 108 (90 BASE) MCG/ACT inhaler Inhale 2 puffs into the lungs every 6 (six) hours as needed for wheezing.  6.7 g  6  . amLODipine (NORVASC) 10 MG tablet Take 1 tablet (10 mg total) by mouth daily.  30 tablet  6  . aspirin 81 MG tablet Take 81 mg by mouth daily.      Marland Kitchen atorvastatin (LIPITOR) 10 MG tablet Take 1 tablet (10 mg total) by mouth daily.  30 tablet  6  . benazepril (LOTENSIN) 40 MG tablet Take 1 tablet (40 mg total) by mouth daily.  30 tablet  5  . cholecalciferol (VITAMIN D) 1000 UNITS tablet Take 2,000 Units by mouth daily.       . citalopram (CELEXA) 40 MG tablet Take 1 tablet (40 mg total) by mouth daily.  30 tablet  0  . clopidogrel (PLAVIX) 75 MG tablet Take 1 tablet (75 mg total) by mouth daily.  30 tablet  1  . diphenhydrAMINE (BENADRYL) 25 mg capsule Take 25 mg by mouth. 2 tabs at night      . levothyroxine (SYNTHROID, LEVOTHROID) 75 MCG tablet Take 1 tablet (75 mcg total) by mouth daily.  30 tablet  6  . montelukast (SINGULAIR) 10 MG tablet Take 1 tablet (10 mg total) by mouth at bedtime.  30 tablet  11  . nebivolol (BYSTOLIC) 10 MG tablet Take 1 tablet (10 mg total) by mouth daily. Need appointment before anymore refills  30 tablet  1  . pantoprazole (PROTONIX) 40 MG tablet Take 1 tablet (40 mg total) by mouth daily.  30 tablet  6  . valsartan (DIOVAN) 80 MG tablet Take 1 tablet (80 mg total) by mouth daily.  30 tablet  5   No current facility-administered medications for this visit.    Allergies  Allergen Reactions  . Banana   . Chicken Allergy   . Demerol [Meperidine]   . Eggs Or Egg-Derived Products   . Metoprolol     asthma  . Milk-Related Compounds   . Orange Fruit [Citrus]   . Other     [place], pecans  . Pineapple   . Salmon [Fish Allergy]     And tuna and oysters  History   Social History  . Marital Status: Married    Spouse Name: N/A    Number of Children: N/A  . Years of Education: N/A   Occupational History  . Not on file.   Social History Main Topics  . Smoking status: Former Smoker    Quit date: 02/28/2002  . Smokeless tobacco: Never Used  . Alcohol Use: Not on file  . Drug Use: Not on file  . Sexual Activity: Not on file   Other Topics Concern  . Not on file   Social History Narrative  . No narrative on file     Review of Systems: General: negative for chills, fever, night sweats or weight changes.  Cardiovascular: negative for chest pain, dyspnea on exertion, edema, orthopnea, palpitations, paroxysmal nocturnal dyspnea or shortness of  breath Dermatological: negative for rash Respiratory: negative for cough or wheezing Urologic: negative for hematuria Abdominal: negative for nausea, vomiting, diarrhea, bright red blood per rectum, melena, or hematemesis Neurologic: negative for visual changes, syncope, or dizziness All other systems reviewed and are otherwise negative except as noted above.    Blood pressure 126/60, pulse 58, height 5' (1.524 m), weight 126 lb 9.6 oz (57.425 kg).  General appearance: alert and no distress Neck: no adenopathy, no JVD, supple, symmetrical, trachea midline, thyroid not enlarged, symmetric, no tenderness/mass/nodules and bilateral carotid bruits Lungs: clear to auscultation bilaterally Heart: regular rate and rhythm, S1, S2 normal, no murmur, click, rub or gallop Extremities: extremities normal, atraumatic, no cyanosis or edema  EKG sinus bradycardia at 58 without ST or T wave changes  ASSESSMENT AND PLAN:   Peripheral vascular disease with claudication History of bilateral iliac stenting by myself 11/25/10. We have been getting lower extremity duplex ultrasounds which were done last month revealing patent stents. She denies claudication.  Carotid artery disease Moderate carotid artery disease bilaterally followed by duplex ultrasound which has remained stable as recently as last month. She is neurologically asymptomatic  HTN (hypertension) Controlled on current medications  Hyperlipemia On statin therapy with recent lipid profile performed 09/25/13 revealed a total cholesterol 139, LDL of 48 and HDL of 48      Runell Gess MD [facility], [facility] 11/01/2013 12:23 PM

## 2013-11-01 NOTE — Telephone Encounter (Signed)
Received fax requesting refill on valsartan.   Refill appropriate and filled per protocol.

## 2013-11-01 NOTE — Assessment & Plan Note (Signed)
On statin therapy with recent lipid profile performed 09/25/13 revealed a total cholesterol 139, LDL of 48 and HDL of 48

## 2013-11-01 NOTE — Assessment & Plan Note (Signed)
Controlled on current medications 

## 2013-11-05 ENCOUNTER — Other Ambulatory Visit: Payer: Self-pay | Admitting: *Deleted

## 2013-11-05 ENCOUNTER — Telehealth: Payer: Self-pay | Admitting: *Deleted

## 2013-11-05 DIAGNOSIS — F419 Anxiety disorder, unspecified: Secondary | ICD-10-CM

## 2013-11-05 MED ORDER — NEBIVOLOL HCL 10 MG PO TABS: 10.0000 mg | ORAL_TABLET | Freq: Every day | ORAL | Status: DC

## 2013-11-05 NOTE — Telephone Encounter (Signed)
Last ov 09/25/13, last labs drawn 09/25/13 last refill 08/26/13  ?ok to refill

## 2013-11-06 MED ORDER — CITALOPRAM HYDROBROMIDE 40 MG PO TABS
40.0000 mg | ORAL_TABLET | Freq: Every day | ORAL | Status: DC
Start: 2013-11-06 — End: 2014-06-03

## 2013-11-06 NOTE — Telephone Encounter (Signed)
Meds refilled.

## 2013-11-06 NOTE — Telephone Encounter (Signed)
Refill approved for #30+4 additional refills

## 2013-11-07 ENCOUNTER — Telehealth: Payer: Self-pay | Admitting: *Deleted

## 2013-11-07 DIAGNOSIS — K219 Gastro-esophageal reflux disease without esophagitis: Secondary | ICD-10-CM

## 2013-11-07 MED ORDER — PANTOPRAZOLE SODIUM 40 MG PO TBEC: 40.0000 mg | DELAYED_RELEASE_TABLET | Freq: Every day | ORAL | Status: DC

## 2013-11-07 NOTE — Telephone Encounter (Signed)
Medication refilled per protocol. 

## 2013-11-18 ENCOUNTER — Other Ambulatory Visit: Payer: Self-pay | Admitting: *Deleted

## 2013-11-18 MED ORDER — CLOPIDOGREL BISULFATE 75 MG PO TABS: 75.0000 mg | ORAL_TABLET | Freq: Every day | ORAL | Status: DC

## 2014-01-01 ENCOUNTER — Telehealth: Payer: Self-pay | Admitting: Family Medicine

## 2014-01-01 DIAGNOSIS — E039 Hypothyroidism, unspecified: Secondary | ICD-10-CM

## 2014-01-01 MED ORDER — LEVOTHYROXINE SODIUM 75 MCG PO TABS: 75.0000 ug | ORAL_TABLET | Freq: Every day | ORAL | Status: DC

## 2014-01-01 NOTE — Telephone Encounter (Signed)
Medication refilled per protocol. 

## 2014-03-03 ENCOUNTER — Encounter: Payer: Self-pay | Admitting: Family Medicine

## 2014-03-03 ENCOUNTER — Telehealth: Payer: Self-pay | Admitting: Family Medicine

## 2014-03-03 DIAGNOSIS — E039 Hypothyroidism, unspecified: Secondary | ICD-10-CM

## 2014-03-03 MED ORDER — LEVOTHYROXINE SODIUM 75 MCG PO TABS: 75.0000 ug | ORAL_TABLET | Freq: Every day | ORAL | Status: DC

## 2014-03-03 NOTE — Telephone Encounter (Signed)
Medication refill for one time only.  Patient needs to be seen.  Letter sent for patient to call and schedule 

## 2014-03-31 ENCOUNTER — Telehealth (HOSPITAL_COMMUNITY): Payer: Self-pay | Admitting: *Deleted

## 2014-04-02 ENCOUNTER — Ambulatory Visit: Payer: Medicare Other | Admitting: Physician Assistant

## 2014-04-03 ENCOUNTER — Encounter: Payer: Self-pay | Admitting: Physician Assistant

## 2014-04-03 ENCOUNTER — Ambulatory Visit (INDEPENDENT_AMBULATORY_CARE_PROVIDER_SITE_OTHER): Payer: Medicare Other | Admitting: Physician Assistant

## 2014-04-03 VITALS — BP 132/60 | HR 60 | Temp 97.7°F | Resp 18 | Wt 124.0 lb

## 2014-04-03 DIAGNOSIS — I708 Atherosclerosis of other arteries: Secondary | ICD-10-CM

## 2014-04-03 DIAGNOSIS — E785 Hyperlipidemia, unspecified: Secondary | ICD-10-CM

## 2014-04-03 DIAGNOSIS — I10 Essential (primary) hypertension: Secondary | ICD-10-CM | POA: Diagnosis not present

## 2014-04-03 DIAGNOSIS — F419 Anxiety disorder, unspecified: Secondary | ICD-10-CM

## 2014-04-03 DIAGNOSIS — I771 Stricture of artery: Secondary | ICD-10-CM

## 2014-04-03 DIAGNOSIS — E559 Vitamin D deficiency, unspecified: Secondary | ICD-10-CM | POA: Diagnosis not present

## 2014-04-03 DIAGNOSIS — I779 Disorder of arteries and arterioles, unspecified: Secondary | ICD-10-CM | POA: Diagnosis not present

## 2014-04-03 DIAGNOSIS — I739 Peripheral vascular disease, unspecified: Secondary | ICD-10-CM

## 2014-04-03 DIAGNOSIS — K219 Gastro-esophageal reflux disease without esophagitis: Secondary | ICD-10-CM

## 2014-04-03 DIAGNOSIS — E039 Hypothyroidism, unspecified: Secondary | ICD-10-CM

## 2014-04-03 LAB — LIPID PANEL
Cholesterol: 174 mg/dL (ref 0–200)
HDL: 40 mg/dL (ref >39)
LDL Cholesterol: 93 mg/dL (ref 0–99)
TRIGLYCERIDES: 204 mg/dL — AB (ref <150)
Total CHOL/HDL Ratio: 4.4 Ratio
VLDL: 41 mg/dL — AB (ref 0–40)

## 2014-04-03 LAB — CBC WITH DIFFERENTIAL/PLATELET
Basophils Absolute: 0.1 10*3/uL (ref 0.0–0.1)
Basophils Relative: 1 % (ref 0–1)
EOS ABS: 0.2 10*3/uL (ref 0.0–0.7)
Eosinophils Relative: 4 % (ref 0–5)
HCT: 38.8 % (ref 36.0–46.0)
HEMOGLOBIN: 12.6 g/dL (ref 12.0–15.0)
LYMPHS ABS: 2.1 10*3/uL (ref 0.7–4.0)
Lymphocytes Relative: 36 % (ref 12–46)
MCH: 31.5 pg (ref 26.0–34.0)
MCHC: 32.5 g/dL (ref 30.0–36.0)
MCV: 97 fL (ref 78.0–100.0)
MONO ABS: 0.4 10*3/uL (ref 0.1–1.0)
MONOS PCT: 7 % (ref 3–12)
MPV: 10.3 fL (ref 8.6–12.4)
NEUTROS ABS: 3 10*3/uL (ref 1.7–7.7)
NEUTROS PCT: 52 % (ref 43–77)
Platelets: 278 10*3/uL (ref 150–400)
RBC: 4 MIL/uL (ref 3.87–5.11)
RDW: 13.1 % (ref 11.5–15.5)
WBC: 5.7 10*3/uL (ref 4.0–10.5)

## 2014-04-03 LAB — COMPLETE METABOLIC PANEL WITH GFR
ALT: 10 U/L (ref 0–35)
AST: 15 U/L (ref 0–37)
Albumin: 4.1 g/dL (ref 3.5–5.2)
Alkaline Phosphatase: 74 U/L (ref 39–117)
BUN: 11 mg/dL (ref 6–23)
CALCIUM: 9.4 mg/dL (ref 8.4–10.5)
CO2: 30 mEq/L (ref 19–32)
Chloride: 101 mEq/L (ref 96–112)
Creat: 0.87 mg/dL (ref 0.50–1.10)
GFR, EST AFRICAN AMERICAN: 78 mL/min
GFR, Est Non African American: 68 mL/min
GLUCOSE: 78 mg/dL (ref 70–99)
Potassium: 4.3 mEq/L (ref 3.5–5.3)
SODIUM: 140 meq/L (ref 135–145)
Total Bilirubin: 0.4 mg/dL (ref 0.2–1.2)
Total Protein: 6.7 g/dL (ref 6.0–8.3)

## 2014-04-03 LAB — TSH: TSH: 1.069 u[IU]/mL (ref 0.350–4.500)

## 2014-04-03 MED ORDER — LEVOTHYROXINE SODIUM 75 MCG PO TABS: 75.0000 ug | ORAL_TABLET | Freq: Every day | ORAL | Status: DC

## 2014-04-03 NOTE — Progress Notes (Signed)
Patient ID: Rocco Paulsatricia Maker MRN: [id number], DOB: Aug 07, 1945, 69 y.o. Date of Encounter: @DATE @  Chief Complaint:  Chief Complaint  Patient presents with  . 6 mth check up    HPI: 69 y.o. year old white female  presents a routine followup office visit.  She has no complaints today. Says that she's been feeling very well. Says she has continued to followup with Dr. Allyson SabalBerry regarding her PVD and has been having routine ultrasound tests to follow this also.   GERD: She states that she does have to take her protonix every day or else she has heartburn symptoms. However with her protonix her symptoms are controlled.  Generalized anxiety disorder:   She is on Celexa 40 mg. She states this is working well and is controlling her anxiety very well. She is having no adverse effects.  Vitamin D deficiency: She is taking 2000 units daily.  Thyroid: She is taking the prescription 75 mcg daily. No change in her hair or skin and no weight changes or palpitations.  No chest pressure, heaviness, tightness, squeezing even with exertion. No increased shortness of breath/dyspnea on exertion.  She has no complaints or concerns today. Says that her current medications are working very well for her.  Past Medical History  Diagnosis Date  . Tobacco abuse   . Claudication     stent in bil iliac 11/25/10  . PVD (peripheral vascular disease)     lower ext and upper ext-L subclavian stenosis by ultrasound, nl nuclear test- 10/27/10  . Murmur, cardiac     echo 10/12/10- EF >55%, mod sclerotic aortic valve  . Hyperlipemia   . HTN (hypertension)   . Anxiety   . Hypothyroidism   . GERD (gastroesophageal reflux disease)   . Vitamin D deficiency      Home Meds:  Outpatient Prescriptions Prior to Visit  Medication Sig Dispense Refill  . albuterol (PROVENTIL HFA;VENTOLIN HFA) 108 (90 BASE) MCG/ACT inhaler Inhale 2 puffs into the lungs every 6 (six) hours as needed for wheezing. 6.7 g 6  . amLODipine  (NORVASC) 10 MG tablet Take 1 tablet (10 mg total) by mouth daily. 30 tablet 6  . aspirin 81 MG tablet Take 81 mg by mouth daily.    Marland Kitchen. atorvastatin (LIPITOR) 10 MG tablet Take 1 tablet (10 mg total) by mouth daily. 30 tablet 6  . benazepril (LOTENSIN) 40 MG tablet Take 1 tablet (40 mg total) by mouth daily. 30 tablet 5  . cholecalciferol (VITAMIN D) 1000 UNITS tablet Take 2,000 Units by mouth daily.    . citalopram (CELEXA) 40 MG tablet Take 1 tablet (40 mg total) by mouth daily. 30 tablet 4  . clopidogrel (PLAVIX) 75 MG tablet Take 1 tablet (75 mg total) by mouth daily. 30 tablet 11  . diphenhydrAMINE (BENADRYL) 25 mg capsule Take 25 mg by mouth. 2 tabs at night    . levothyroxine (SYNTHROID, LEVOTHROID) 75 MCG tablet Take 1 tablet (75 mcg total) by mouth daily. 30 tablet 0  . montelukast (SINGULAIR) 10 MG tablet Take 1 tablet (10 mg total) by mouth at bedtime. 30 tablet 11  . nebivolol (BYSTOLIC) 10 MG tablet Take 1 tablet (10 mg total) by mouth daily. 30 tablet 9  . pantoprazole (PROTONIX) 40 MG tablet Take 1 tablet (40 mg total) by mouth daily. 30 tablet 6  . valsartan (DIOVAN) 80 MG tablet Take 1 tablet (80 mg total) by mouth daily. 30 tablet 5   No facility-administered medications prior to  visit.     Allergies:  Allergies  Allergen Reactions  . Banana   . Chicken Allergy   . Demerol [Meperidine]   . Eggs Or Egg-Derived Products   . Metoprolol     asthma  . Milk-Related Compounds   . Orange Fruit [Citrus]   . Other     [place], pecans  . Pineapple   . Carmie Kanner [Fish Allergy]     And tuna and oysters    History   Social History  . Marital Status: Married    Spouse Name: N/A    Number of Children: N/A  . Years of Education: N/A   Occupational History  . Not on file.   Social History Main Topics  . Smoking status: Former Smoker    Quit date: 02/28/2002  . Smokeless tobacco: Never Used  . Alcohol Use: Not on file  . Drug Use: Not on file  . Sexual Activity: Not on  file   Other Topics Concern  . Not on file   Social History Narrative    History reviewed. No pertinent family history.   Review of Systems:  See HPI for pertinent ROS. All other ROS negative.    Physical Exam: Blood pressure 132/60, pulse 60, temperature 97.7 F (36.5 C), temperature source Oral, resp. rate 18, weight 124 lb (56.246 kg)., Body mass index is 24.22 kg/(m^2). General: Very pleasant WF. Appears in no acute distress. Neck: Supple. No thyromegaly. No lymphadenopathy. Lungs: Clear bilaterally to auscultation without wheezes, rales, or rhonchi. Breathing is unlabored. Heart: RRR with S1 S2. No murmurs, rubs, or gallops. Abdomen: Soft, non-tender, non-distended with normoactive bowel sounds. No hepatomegaly. No rebound/guarding. No obvious abdominal masses. Musculoskeletal:  Strength and tone normal for age. Extremities/Skin: Warm and dry. No LE edema.  Neuro: Alert and oriented X 3. Moves all extremities spontaneously. Gait is normal. CNII-XII grossly in tact. Psych:  Responds to questions appropriately with a normal affect.     ASSESSMENT AND PLAN:  69 y.o. year old female with  1. Anxiety Well controlled with Celexa .  2. Carotid artery disease Per Dr. Allyson Sabal On ACE inhibitor, statin, Plavix, aspirin 81 mg Check CBC as she is on aspirin and Plavix.  3. GERD (gastroesophageal reflux disease) Well-controlled with her protonix but she does have to take this every day in order to control symptoms  4. HTN (hypertension) Blood pressure is at goal. Continue current medications and check lab to monitor. - COMPLETE METABOLIC PANEL WITH GFR  5. Hyperlipemia She is fasting. She is on Lipitor.  - COMPLETE METABOLIC PANEL WITH GFR - Lipid panel  6. Hypothyroidism On levothyroxine 75 mcg daily - TSH  7. Peripheral vascular disease with claudication Per Dr. Allyson Sabal. Will check CBC as she is on ASA, Plavix - Lipid panel - CBC with Differential  8. Subclavian  artery stenosis, left Per Dr. Allyson Sabal  9. Vitamin D deficiency She is taking 2000 units daily - Vit D  25 hydroxy (rtn osteoporosis monitoring)  10. Preventive health care: I have discussed mammogram/pelvic exam/Pap smear/Dexa/colonoscopy/immunizations with her multiple times in the past and again today. She still defers. She is aware of risk and benefits of each procedure but still refuses.  Regular office visit and labs in 6 months or sooner if needed.   Signed, [address] [place], [place], [facility] 04/03/2014 11:58 AM

## 2014-04-17 ENCOUNTER — Telehealth: Payer: Self-pay | Admitting: Family Medicine

## 2014-04-17 DIAGNOSIS — I10 Essential (primary) hypertension: Secondary | ICD-10-CM

## 2014-04-17 MED ORDER — BENAZEPRIL HCL 40 MG PO TABS: 40.0000 mg | ORAL_TABLET | Freq: Every day | ORAL | Status: DC

## 2014-04-17 NOTE — Telephone Encounter (Signed)
Medication refilled per protocol. 

## 2014-05-09 ENCOUNTER — Telehealth: Payer: Self-pay | Admitting: Family Medicine

## 2014-05-09 DIAGNOSIS — I10 Essential (primary) hypertension: Secondary | ICD-10-CM

## 2014-05-09 MED ORDER — VALSARTAN 80 MG PO TABS: 80.0000 mg | ORAL_TABLET | Freq: Every day | ORAL | Status: DC

## 2014-05-09 NOTE — Telephone Encounter (Signed)
Medication refilled per protocol. 

## 2014-05-12 ENCOUNTER — Ambulatory Visit: Payer: Medicare Other | Admitting: Physician Assistant

## 2014-06-03 ENCOUNTER — Telehealth: Payer: Self-pay | Admitting: Family Medicine

## 2014-06-03 DIAGNOSIS — F419 Anxiety disorder, unspecified: Secondary | ICD-10-CM

## 2014-06-03 MED ORDER — CITALOPRAM HYDROBROMIDE 40 MG PO TABS: 40.0000 mg | ORAL_TABLET | Freq: Every day | ORAL | Status: DC

## 2014-06-03 NOTE — Telephone Encounter (Signed)
Medication refilled per protocol. 

## 2014-06-10 ENCOUNTER — Telehealth: Payer: Self-pay | Admitting: Family Medicine

## 2014-06-10 DIAGNOSIS — K219 Gastro-esophageal reflux disease without esophagitis: Secondary | ICD-10-CM

## 2014-06-10 MED ORDER — PANTOPRAZOLE SODIUM 40 MG PO TBEC: 40.0000 mg | DELAYED_RELEASE_TABLET | Freq: Every day | ORAL | Status: DC

## 2014-06-10 NOTE — Telephone Encounter (Signed)
Medication refilled per protocol. 

## 2014-06-11 ENCOUNTER — Ambulatory Visit (INDEPENDENT_AMBULATORY_CARE_PROVIDER_SITE_OTHER): Payer: Medicare Other | Admitting: Physician Assistant

## 2014-06-11 ENCOUNTER — Encounter: Payer: Self-pay | Admitting: Physician Assistant

## 2014-06-11 VITALS — BP 112/74 | HR 68 | Temp 97.9°F | Resp 18 | Wt 123.0 lb

## 2014-06-11 DIAGNOSIS — J4521 Mild intermittent asthma with (acute) exacerbation: Secondary | ICD-10-CM | POA: Diagnosis not present

## 2014-06-11 DIAGNOSIS — J309 Allergic rhinitis, unspecified: Secondary | ICD-10-CM | POA: Insufficient documentation

## 2014-06-11 DIAGNOSIS — J45901 Unspecified asthma with (acute) exacerbation: Secondary | ICD-10-CM | POA: Insufficient documentation

## 2014-06-11 DIAGNOSIS — J4520 Mild intermittent asthma, uncomplicated: Secondary | ICD-10-CM | POA: Diagnosis not present

## 2014-06-11 DIAGNOSIS — B9689 Other specified bacterial agents as the cause of diseases classified elsewhere: Secondary | ICD-10-CM

## 2014-06-11 DIAGNOSIS — J45909 Unspecified asthma, uncomplicated: Secondary | ICD-10-CM | POA: Insufficient documentation

## 2014-06-11 DIAGNOSIS — J988 Other specified respiratory disorders: Secondary | ICD-10-CM | POA: Diagnosis not present

## 2014-06-11 MED ORDER — METHYLPREDNISOLONE ACETATE 80 MG/ML IJ SUSP
80.0000 mg | Freq: Once | INTRAMUSCULAR | Status: AC
  Administered 2014-06-11: 60 mg via INTRAMUSCULAR

## 2014-06-11 MED ORDER — AZITHROMYCIN 250 MG PO TABS: ORAL_TABLET | ORAL | Status: DC

## 2014-06-11 MED ORDER — IPRATROPIUM-ALBUTEROL 0.5-2.5 (3) MG/3ML IN SOLN
3.0000 mL | Freq: Once | RESPIRATORY_TRACT | Status: AC
  Administered 2014-06-11: 3 mL via RESPIRATORY_TRACT

## 2014-06-11 NOTE — Progress Notes (Signed)
Patient ID: Alexis [name] MRN: [id number], DOB: July 29, 1945, 69 y.o. Date of Encounter: @  Chief Complaint:  Chief Complaint  Patient presents with  . sick x 1 week    sinuses, asthma    HPI: 69 y.o. year old white female  presents with above symptoms.   Says that she's been sick for about a week. It started out just all head and nose but just went into her chest the last couple of days. "Started having problems with my asthma yesterday and today". Used albuterol inhaler this morning x one. This is the only time she has needed her albuterol. Has been getting thick yellow mucus from her nose. No fevers or chills. No sore throat. Feel some pressure and congestion behind her ears.  Has had no asthma flare or symptoms like this in over a year.   Past Medical History  Diagnosis Date  . Tobacco abuse   . Claudication     stent in bil iliac 11/25/10  . PVD (peripheral vascular disease)     lower ext and upper ext-L subclavian stenosis by ultrasound, nl nuclear test- 10/27/10  . Murmur, cardiac     echo 10/12/10- EF >55%, mod sclerotic aortic valve  . Hyperlipemia   . HTN (hypertension)   . Anxiety   . Hypothyroidism   . GERD (gastroesophageal reflux disease)   . Vitamin D deficiency      Home Meds: Outpatient Prescriptions Prior to Visit  Medication Sig Dispense Refill  . albuterol (PROVENTIL HFA;VENTOLIN HFA) 108 (90 BASE) MCG/ACT inhaler Inhale 2 puffs into the lungs every 6 (six) hours as needed for wheezing. 6.7 g 6  . amLODipine (NORVASC) 10 MG tablet Take 1 tablet (10 mg total) by mouth daily. 30 tablet 6  . aspirin 81 MG tablet Take 81 mg by mouth daily.    Marland Kitchen atorvastatin (LIPITOR) 10 MG tablet Take 1 tablet (10 mg total) by mouth daily. 30 tablet 6  . benazepril (LOTENSIN) 40 MG tablet Take 1 tablet (40 mg total) by mouth daily. 30 tablet 5  . cholecalciferol (VITAMIN D) 1000 UNITS tablet Take 2,000 Units by mouth daily.    . citalopram (CELEXA) 40 MG tablet  Take 1 tablet (40 mg total) by mouth daily. 30 tablet 5  . clopidogrel (PLAVIX) 75 MG tablet Take 1 tablet (75 mg total) by mouth daily. 30 tablet 11  . diphenhydrAMINE (BENADRYL) 25 mg capsule Take 25 mg by mouth. 2 tabs at night    . levothyroxine (SYNTHROID, LEVOTHROID) 75 MCG tablet Take 1 tablet (75 mcg total) by mouth daily. 30 tablet 5  . montelukast (SINGULAIR) 10 MG tablet Take 1 tablet (10 mg total) by mouth at bedtime. 30 tablet 11  . nebivolol (BYSTOLIC) 10 MG tablet Take 1 tablet (10 mg total) by mouth daily. 30 tablet 9  . pantoprazole (PROTONIX) 40 MG tablet Take 1 tablet (40 mg total) by mouth daily. 30 tablet 6  . valsartan (DIOVAN) 80 MG tablet Take 1 tablet (80 mg total) by mouth daily. 30 tablet 5   No facility-administered medications prior to visit.    Allergies:  Allergies  Allergen Reactions  . Banana   . Chicken Allergy   . Demerol [Meperidine]   . Eggs Or Egg-Derived Products   . Metoprolol     asthma  . Milk-Related Compounds   . Orange Fruit [Citrus]   . Other     [place], pecans  . Pineapple   . Carmie Kanner [Fish Allergy]  And tuna and oysters  . Penicillins Rash    History   Social History  . Marital Status: Married    Spouse Name: N/A  . Number of Children: N/A  . Years of Education: N/A   Occupational History  . Not on file.   Social History Main Topics  . Smoking status: Former Smoker    Quit date: 02/28/2002  . Smokeless tobacco: Never Used  . Alcohol Use: Not on file  . Drug Use: Not on file  . Sexual Activity: Not on file   Other Topics Concern  . Not on file   Social History Narrative    History reviewed. No pertinent family history.   Review of Systems:  See HPI for pertinent ROS. All other ROS negative.    Physical Exam: Blood pressure 112/74, pulse 68, temperature 97.9 F (36.6 C), temperature source Oral, resp. rate 18, weight 123 lb (55.792 kg)., Body mass index is 24.02 kg/(m^2). General: WNWD WF. Appears in no  acute distress. No dyspnea with talking. Head: Normocephalic, atraumatic, eyes without discharge, sclera non-icteric, nares are without discharge. Bilateral auditory canals clear, TM's are without perforation. TMs do appear dull.  Oral cavity moist, posterior pharynx without exudate, erythema, peritonsillar abscess. No tenderness with percussion of frontal and maxillary sinuses bilaterally. Neck: Supple. No thyromegaly. No lymphadenopathy. Lungs: No dyspnea with talking. Appears in no distress. No use of accessory muscles. She has decreased distant breath sounds throughout. Decreased air movement. Oxygen saturation is 91% at rest. Repeat Exam after Nebulizer treatment--increased air movement and breath sounds. SaO2 still ranging 90 - 92% Heart: RRR with S1 S2. No murmurs, rubs, or gallops. Musculoskeletal:  Strength and tone normal for age. Extremities/Skin: Warm and dry. Neuro: Alert and oriented X 3. Moves all extremities spontaneously. Gait is normal. CNII-XII grossly in tact. Psych:  Responds to questions appropriately with a normal affect.     ASSESSMENT AND PLAN:  69 y.o. year old female with  1. Bacterial respiratory infection - azithromycin (ZITHROMAX) 250 MG tablet; Day 1: Take 2 daily.  Days 2-5: Take 1 daily.  Dispense: 6 tablet; Refill: 0  2. Allergic rhinitis, unspecified allergic rhinitis type 3. Asthma with acute exacerbation, mild intermittent 4. Extrinsic asthma, mild intermittent, uncomplicated  Will treat with Depo-Medrol 60 mg IM here in the office Will treat with DuoNeb here in the office  Repeat exam performed after these treatments---see exam above  She is to go pick up the azithromycin now and take first dose as soon as she gets home. She is to use her albuterol inhaler every 4 hours for the next several days then can decrease to as needed.  F/U  with us if symptoms worsen at all, or if they are not significantly improved over the next 48 hours. Also, f/u if  symptoms are not resolved in one week.     [address], Mary Beth [place], [place], [facility] 06/11/2014 12:04 PM

## 2014-06-17 ENCOUNTER — Telehealth: Payer: Self-pay | Admitting: Family Medicine

## 2014-06-17 DIAGNOSIS — I10 Essential (primary) hypertension: Secondary | ICD-10-CM

## 2014-06-17 DIAGNOSIS — E785 Hyperlipidemia, unspecified: Secondary | ICD-10-CM

## 2014-06-17 MED ORDER — AMLODIPINE BESYLATE 10 MG PO TABS: 10.0000 mg | ORAL_TABLET | Freq: Every day | ORAL | Status: DC

## 2014-06-17 MED ORDER — ATORVASTATIN CALCIUM 10 MG PO TABS: 10.0000 mg | ORAL_TABLET | Freq: Every day | ORAL | Status: DC

## 2014-06-17 NOTE — Telephone Encounter (Signed)
Medication refilled per protocol. 

## 2014-09-08 ENCOUNTER — Other Ambulatory Visit: Payer: Self-pay | Admitting: *Deleted

## 2014-09-08 MED ORDER — NEBIVOLOL HCL 10 MG PO TABS: 10.0000 mg | ORAL_TABLET | Freq: Every day | ORAL | Status: DC

## 2014-09-08 NOTE — Telephone Encounter (Signed)
Rx(s) sent to pharmacy electronically.  

## 2014-09-29 ENCOUNTER — Encounter: Payer: Self-pay | Admitting: Family Medicine

## 2014-09-29 ENCOUNTER — Other Ambulatory Visit: Payer: Self-pay | Admitting: Physician Assistant

## 2014-09-29 NOTE — Telephone Encounter (Signed)
Medication refill for one time only.  Patient needs to be seen.  Letter sent for patient to call and schedule 

## 2014-10-07 ENCOUNTER — Other Ambulatory Visit: Payer: Self-pay | Admitting: Physician Assistant

## 2014-10-07 NOTE — Telephone Encounter (Signed)
Medication filled x1 with no refills.   Requires office visit before any further refills can be given.   Letter sent.  

## 2014-10-20 ENCOUNTER — Other Ambulatory Visit: Payer: Self-pay | Admitting: Physician Assistant

## 2014-10-20 DIAGNOSIS — I10 Essential (primary) hypertension: Secondary | ICD-10-CM

## 2014-10-21 ENCOUNTER — Encounter: Payer: Self-pay | Admitting: Family Medicine

## 2014-10-21 NOTE — Telephone Encounter (Signed)
Medication refill for one time only.  Patient needs to be seen.  Letter sent for patient to call and schedule 

## 2014-11-05 ENCOUNTER — Encounter: Payer: Self-pay | Admitting: Physician Assistant

## 2014-11-05 ENCOUNTER — Ambulatory Visit (INDEPENDENT_AMBULATORY_CARE_PROVIDER_SITE_OTHER): Payer: Medicare Other | Admitting: Physician Assistant

## 2014-11-05 ENCOUNTER — Other Ambulatory Visit: Payer: Self-pay | Admitting: Family Medicine

## 2014-11-05 VITALS — BP 130/60 | HR 68 | Temp 98.1°F | Resp 18 | Wt 124.0 lb

## 2014-11-05 DIAGNOSIS — I771 Stricture of artery: Secondary | ICD-10-CM

## 2014-11-05 DIAGNOSIS — J4520 Mild intermittent asthma, uncomplicated: Secondary | ICD-10-CM | POA: Diagnosis not present

## 2014-11-05 DIAGNOSIS — E785 Hyperlipidemia, unspecified: Secondary | ICD-10-CM

## 2014-11-05 DIAGNOSIS — E039 Hypothyroidism, unspecified: Secondary | ICD-10-CM | POA: Diagnosis not present

## 2014-11-05 DIAGNOSIS — K219 Gastro-esophageal reflux disease without esophagitis: Secondary | ICD-10-CM | POA: Diagnosis not present

## 2014-11-05 DIAGNOSIS — F419 Anxiety disorder, unspecified: Secondary | ICD-10-CM

## 2014-11-05 DIAGNOSIS — I708 Atherosclerosis of other arteries: Secondary | ICD-10-CM | POA: Diagnosis not present

## 2014-11-05 DIAGNOSIS — I10 Essential (primary) hypertension: Secondary | ICD-10-CM | POA: Diagnosis not present

## 2014-11-05 DIAGNOSIS — E559 Vitamin D deficiency, unspecified: Secondary | ICD-10-CM | POA: Diagnosis not present

## 2014-11-05 DIAGNOSIS — I739 Peripheral vascular disease, unspecified: Secondary | ICD-10-CM | POA: Diagnosis not present

## 2014-11-05 DIAGNOSIS — J309 Allergic rhinitis, unspecified: Secondary | ICD-10-CM | POA: Diagnosis not present

## 2014-11-05 DIAGNOSIS — I779 Disorder of arteries and arterioles, unspecified: Secondary | ICD-10-CM | POA: Diagnosis not present

## 2014-11-05 DIAGNOSIS — J4521 Mild intermittent asthma with (acute) exacerbation: Secondary | ICD-10-CM | POA: Diagnosis not present

## 2014-11-05 LAB — COMPLETE METABOLIC PANEL WITH GFR
ALT: 9 U/L (ref 6–29)
AST: 16 U/L (ref 10–35)
Albumin: 4.8 g/dL (ref 3.6–5.1)
Alkaline Phosphatase: 75 U/L (ref 33–130)
BUN: 15 mg/dL (ref 7–25)
CALCIUM: 9.6 mg/dL (ref 8.6–10.4)
CO2: 30 mmol/L (ref 20–31)
Chloride: 98 mmol/L (ref 98–110)
Creat: 0.83 mg/dL (ref 0.60–0.93)
GFR, EST AFRICAN AMERICAN: 83 mL/min (ref >=60)
GFR, Est Non African American: 72 mL/min (ref >=60)
Glucose, Bld: 77 mg/dL (ref 70–99)
POTASSIUM: 4.8 mmol/L (ref 3.5–5.3)
Sodium: 137 mmol/L (ref 135–146)
Total Bilirubin: 0.4 mg/dL (ref 0.2–1.2)
Total Protein: 7.4 g/dL (ref 6.1–8.1)

## 2014-11-05 LAB — TSH: TSH: 0.362 u[IU]/mL (ref 0.350–4.500)

## 2014-11-05 MED ORDER — BENAZEPRIL HCL 40 MG PO TABS: 40.0000 mg | ORAL_TABLET | Freq: Every day | ORAL | Status: DC

## 2014-11-05 MED ORDER — MONTELUKAST SODIUM 10 MG PO TABS: 10.0000 mg | ORAL_TABLET | Freq: Every day | ORAL | Status: DC

## 2014-11-05 MED ORDER — LEVOTHYROXINE SODIUM 75 MCG PO TABS: 75.0000 ug | ORAL_TABLET | Freq: Every day | ORAL | Status: DC

## 2014-11-05 MED ORDER — VALSARTAN 80 MG PO TABS: 80.0000 mg | ORAL_TABLET | Freq: Every day | ORAL | Status: DC

## 2014-11-05 NOTE — Progress Notes (Signed)
Patient ID: Ivelise Castillo MRN: [id number], DOB: 1945/10/10, 69 y.o. Date of Encounter: @  Chief Complaint:  Chief Complaint  Patient presents with  . Medication Management  . Medication Refill    Levothyroxine, benazepril,diovan, singulair    HPI: 69 y.o. year old white female  presents a routine followup office visit.  She has no complaints today. Says that she's been feeling very well.  At her last office visit with me 04/03/14 she had continued to followup with Dr. Allyson Sabal regarding her PVD and has been having routine ultrasound tests to follow this also. Today I reviewed that her last visit with him was 11/01/2013. Today I asked if she was supposed to follow-up with him annually and her response is " I guess so"--told her to schedule follow-up visit with him.   GERD: She states that she does have to take her protonix every day or else she has heartburn symptoms. However with her protonix her symptoms are controlled.  Generalized anxiety disorder:   She is on Celexa 40 mg. She states this is working well and is controlling her anxiety very well. She is having no adverse effects.  Vitamin D deficiency: She is taking 2000 units daily.  Thyroid: She is taking the prescription 75 mcg daily. No change in her hair or skin and no weight changes or palpitations.  No chest pressure, heaviness, tightness, squeezing even with exertion. No increased shortness of breath/dyspnea on exertion.  She has no complaints or concerns today. Says that her current medications are working very well for her.  Past Medical History  Diagnosis Date  . Tobacco abuse   . Claudication     stent in bil iliac 11/25/10  . PVD (peripheral vascular disease)     lower ext and upper ext-L subclavian stenosis by ultrasound, nl nuclear test- 10/27/10  . Murmur, cardiac     echo 10/12/10- EF >55%, mod sclerotic aortic valve  . Hyperlipemia   . HTN (hypertension)   . Anxiety   . Hypothyroidism   . GERD  (gastroesophageal reflux disease)   . Vitamin D deficiency      Home Meds:  Outpatient Prescriptions Prior to Visit  Medication Sig Dispense Refill  . albuterol (PROVENTIL HFA;VENTOLIN HFA) 108 (90 BASE) MCG/ACT inhaler Inhale 2 puffs into the lungs every 6 (six) hours as needed for wheezing. 6.7 g 6  . amLODipine (NORVASC) 10 MG tablet Take 1 tablet (10 mg total) by mouth daily. 30 tablet 6  . aspirin 81 MG tablet Take 81 mg by mouth daily.    Marland Kitchen atorvastatin (LIPITOR) 10 MG tablet Take 1 tablet (10 mg total) by mouth daily. 30 tablet 6  . benazepril (LOTENSIN) 40 MG tablet TAKE 1 TABLET BY MOUTH DAILY 30 tablet 0  . cholecalciferol (VITAMIN D) 1000 UNITS tablet Take 2,000 Units by mouth daily.    . citalopram (CELEXA) 40 MG tablet Take 1 tablet (40 mg total) by mouth daily. 30 tablet 5  . clopidogrel (PLAVIX) 75 MG tablet Take 1 tablet (75 mg total) by mouth daily. 30 tablet 11  . diphenhydrAMINE (BENADRYL) 25 mg capsule Take 25 mg by mouth. 2 tabs at night    . levothyroxine (SYNTHROID, LEVOTHROID) 75 MCG tablet TAKE 1 TABLET BY MOUTH DAILY 30 tablet 0  . montelukast (SINGULAIR) 10 MG tablet TAKE ONE TABLET BY MOUTH AT BEDTIME 30 tablet 0  . nebivolol (BYSTOLIC) 10 MG tablet Take 1 tablet (10 mg total) by mouth daily. 30 tablet 2  .  pantoprazole (PROTONIX) 40 MG tablet Take 1 tablet (40 mg total) by mouth daily. 30 tablet 6  . valsartan (DIOVAN) 80 MG tablet Take 1 tablet (80 mg total) by mouth daily. 30 tablet 5  . azithromycin (ZITHROMAX) 250 MG tablet Day 1: Take 2 daily.  Days 2-5: Take 1 daily. 6 tablet 0   No facility-administered medications prior to visit.     Allergies:  Allergies  Allergen Reactions  . Banana   . Chicken Allergy   . Demerol [Meperidine]   . Eggs Or Egg-Derived Products   . Metoprolol     asthma  . Milk-Related Compounds   . Orange Fruit [Citrus]   . Other     [place], pecans  . Pineapple   . Salmon [Fish Allergy]     And tuna and oysters  .  Penicillins Rash    Social History   Social History  . Marital Status: Married    Spouse Name: N/A  . Number of Children: N/A  . Years of Education: N/A   Occupational History  . Not on file.   Social History Main Topics  . Smoking status: Former Smoker    Quit date: 02/28/2002  . Smokeless tobacco: Never Used  . Alcohol Use: Not on file  . Drug Use: Not on file  . Sexual Activity: Not on file   Other Topics Concern  . Not on file   Social History Narrative    History reviewed. No pertinent family history.   Review of Systems:  See HPI for pertinent ROS. All other ROS negative.    Physical Exam: Blood pressure 130/60, pulse 68, temperature 98.1 F (36.7 C), temperature source Oral, resp. rate 18, weight 124 lb (56.246 kg)., Body mass index is 24.22 kg/(m^2). General: Very pleasant WF. Appears in no acute distress. Neck: Supple. No thyromegaly. No lymphadenopathy. Soft carotid bruits bilaterally. Lungs: Clear bilaterally to auscultation without wheezes, rales, or rhonchi. Breathing is unlabored. Heart: RRR with S1 S2. No murmurs, rubs, or gallops. Abdomen: Soft, non-tender, non-distended with normoactive bowel sounds. No hepatomegaly. No rebound/guarding. No obvious abdominal masses. Musculoskeletal:  Strength and tone normal for age. Extremities/Skin: Warm and dry. No LE edema.  Neuro: Alert and oriented X 3. Moves all extremities spontaneously. Gait is normal. CNII-XII grossly in tact. Psych:  Responds to questions appropriately with a normal affect.     ASSESSMENT AND PLAN:  69 y.o. year old female with  1. Anxiety Well controlled with Celexa 40mg .  2. Carotid artery disease Per Dr. Allyson Sabal On ACE inhibitor, statin, Plavix, aspirin 81 mg Checked CBC 04/03/14 as she is on aspirin and Plavix.--CBC at that time was normal. Can wait to repeat.  3. GERD (gastroesophageal reflux disease) Well-controlled with her protonix but she does have to take this every day in  order to control symptoms  4. HTN (hypertension) Blood pressure is at goal. Continue current medications and check lab to monitor. - COMPLETE METABOLIC PANEL WITH GFR  5. Hyperlipemia She is fasting. She is on Lipitor.  - COMPLETE METABOLIC PANEL WITH GFR - Lipid panel  6. Hypothyroidism On levothyroxine 75 mcg daily - TSH  7. Peripheral vascular disease with claudication Per Dr. Allyson Sabal.  - Lipid panel---was checked 04/03/14 and was controlled with triglyceride 204 HDL 40 LDL 93. - CBC was normal 04/03/14. This was checked at that time she is on aspirin and Plavix. Can wait to recheck this.  8. Subclavian artery stenosis, left Per Dr. Allyson Sabal - Lipid panel---was checked  04/03/14 and was controlled with triglyceride 204 HDL 40 LDL 93. - CBC was normal 04/03/14. This was checked at that time she is on aspirin and Plavix. Can wait to recheck this.  9. Vitamin D deficiency She is taking 2000 units daily - Vit D  25 hydroxy (rtn osteoporosis monitoring)----vitamin D level was checked 04/03/14 and was good at that time. Can wait to recheck in the future.  10. Preventive health care: I have discussed mammogram/pelvic exam/Pap smear/Dexa/colonoscopy/immunizations with her multiple times in the past and again today. She still defers. She is aware of risk and benefits of each procedure but still refuses.  Regular office visit and labs in 6 months or sooner if needed.   Signed, [address] [place], [place], [facility] 11/05/2014 1:25 PM

## 2014-11-07 ENCOUNTER — Encounter: Payer: Self-pay | Admitting: Family Medicine

## 2014-11-14 ENCOUNTER — Other Ambulatory Visit: Payer: Self-pay | Admitting: Physician Assistant

## 2014-11-14 NOTE — Telephone Encounter (Signed)
Medication refilled per protocol. 

## 2014-11-20 ENCOUNTER — Other Ambulatory Visit: Payer: Self-pay | Admitting: Cardiovascular Disease

## 2014-11-20 MED ORDER — CLOPIDOGREL BISULFATE 75 MG PO TABS: 75.0000 mg | ORAL_TABLET | Freq: Every day | ORAL | Status: DC

## 2014-12-02 ENCOUNTER — Other Ambulatory Visit: Payer: Self-pay | Admitting: Physician Assistant

## 2014-12-02 NOTE — Telephone Encounter (Signed)
Medication refilled per protocol. 

## 2015-01-08 ENCOUNTER — Other Ambulatory Visit: Payer: Self-pay | Admitting: Family Medicine

## 2015-01-08 DIAGNOSIS — K219 Gastro-esophageal reflux disease without esophagitis: Secondary | ICD-10-CM

## 2015-01-08 MED ORDER — PANTOPRAZOLE SODIUM 40 MG PO TBEC: 40.0000 mg | DELAYED_RELEASE_TABLET | Freq: Every day | ORAL | Status: DC

## 2015-01-08 NOTE — Telephone Encounter (Signed)
Medication refilled per protocol. 

## 2015-03-11 ENCOUNTER — Encounter: Payer: Self-pay | Admitting: Family Medicine

## 2015-03-11 ENCOUNTER — Other Ambulatory Visit: Payer: Self-pay | Admitting: Physician Assistant

## 2015-03-11 NOTE — Telephone Encounter (Signed)
Medication refill for one time only.  Patient needs to be seen.  Letter sent for patient to call and schedule 

## 2015-03-19 ENCOUNTER — Other Ambulatory Visit: Payer: Self-pay | Admitting: Physician Assistant

## 2015-03-19 NOTE — Telephone Encounter (Signed)
Refill appropriate and filled per protocol. 

## 2015-04-29 ENCOUNTER — Other Ambulatory Visit: Payer: Self-pay | Admitting: Physician Assistant

## 2015-04-30 ENCOUNTER — Ambulatory Visit (INDEPENDENT_AMBULATORY_CARE_PROVIDER_SITE_OTHER): Payer: Medicare Other | Admitting: Physician Assistant

## 2015-04-30 ENCOUNTER — Encounter: Payer: Self-pay | Admitting: Physician Assistant

## 2015-04-30 VITALS — BP 142/72 | HR 78 | Temp 98.0°F | Resp 16 | Ht 60.0 in | Wt 119.0 lb

## 2015-04-30 DIAGNOSIS — F419 Anxiety disorder, unspecified: Secondary | ICD-10-CM

## 2015-04-30 DIAGNOSIS — J4521 Mild intermittent asthma with (acute) exacerbation: Secondary | ICD-10-CM | POA: Diagnosis not present

## 2015-04-30 DIAGNOSIS — I739 Peripheral vascular disease, unspecified: Secondary | ICD-10-CM | POA: Diagnosis not present

## 2015-04-30 DIAGNOSIS — I10 Essential (primary) hypertension: Secondary | ICD-10-CM

## 2015-04-30 DIAGNOSIS — K219 Gastro-esophageal reflux disease without esophagitis: Secondary | ICD-10-CM

## 2015-04-30 DIAGNOSIS — E039 Hypothyroidism, unspecified: Secondary | ICD-10-CM | POA: Diagnosis not present

## 2015-04-30 DIAGNOSIS — I779 Disorder of arteries and arterioles, unspecified: Secondary | ICD-10-CM

## 2015-04-30 DIAGNOSIS — I771 Stricture of artery: Secondary | ICD-10-CM

## 2015-04-30 DIAGNOSIS — I708 Atherosclerosis of other arteries: Secondary | ICD-10-CM

## 2015-04-30 DIAGNOSIS — E559 Vitamin D deficiency, unspecified: Secondary | ICD-10-CM | POA: Diagnosis not present

## 2015-04-30 DIAGNOSIS — E785 Hyperlipidemia, unspecified: Secondary | ICD-10-CM | POA: Diagnosis not present

## 2015-04-30 LAB — COMPLETE METABOLIC PANEL WITH GFR
ALBUMIN: 4.6 g/dL (ref 3.6–5.1)
ALK PHOS: 82 U/L (ref 33–130)
ALT: 10 U/L (ref 6–29)
AST: 16 U/L (ref 10–35)
BILIRUBIN TOTAL: 0.5 mg/dL (ref 0.2–1.2)
BUN: 10 mg/dL (ref 7–25)
CALCIUM: 9.8 mg/dL (ref 8.6–10.4)
CO2: 28 mmol/L (ref 20–31)
Chloride: 101 mmol/L (ref 98–110)
Creat: 0.7 mg/dL (ref 0.60–0.93)
GFR, Est African American: >89 mL/min (ref >=60)
GFR, Est Non African American: 87 mL/min (ref >=60)
GLUCOSE: 80 mg/dL (ref 70–99)
POTASSIUM: 4.4 mmol/L (ref 3.5–5.3)
SODIUM: 142 mmol/L (ref 135–146)
TOTAL PROTEIN: 7.7 g/dL (ref 6.1–8.1)

## 2015-04-30 LAB — LIPID PANEL
CHOLESTEROL: 209 mg/dL — AB (ref 125–200)
HDL: 53 mg/dL (ref >=46)
LDL Cholesterol: 118 mg/dL (ref <130)
Total CHOL/HDL Ratio: 3.9 Ratio (ref <=5.0)
Triglycerides: 191 mg/dL — ABNORMAL HIGH (ref <150)
VLDL: 38 mg/dL — ABNORMAL HIGH (ref <30)

## 2015-04-30 LAB — TSH: TSH: 0.44 m[IU]/L

## 2015-04-30 NOTE — Progress Notes (Signed)
Patient ID: Alexis [name] MRN: [id number], DOB: 1945-06-02, 70 y.o. Date of Encounter: @DATE @  Chief Complaint:  Chief Complaint  Patient presents with  . Medication Management    is fasting    HPI: 70 y.o. year old white female  presents a routine followup office visit.   At her office visit with me 04/03/14 she had continued to followup with Dr. Allyson Sabal regarding her PVD and has been having routine ultrasound tests to follow this also. Today I reviewed that her last visit with him was 11/01/2013. Today I have told her to schedule f/u with him. Discussed need to have f/u U/S , Dopplers.  She voices understanding and agrees to call and schedule f/u with him.   GERD: She states that she does have to take her protonix every day or else she has heartburn symptoms. However with her protonix her symptoms are controlled.  Generalized anxiety disorder:   She is on Celexa 40 mg. She states this is working well and is controlling her anxiety very well. She is having no adverse effects. At OV 04/30/2015 she does report that it has recently been the anniversary of when her mother passed away.  She also reports that she is now the age that her mother was when she passed away and her daughter is the age that pt was when her mom passed away.  Says that this has been causing her some stress but says that she is able to deal with it okay and feels that her anxiety is controlled and stable.  Vitamin D deficiency: She is taking 2000 units daily.  Thyroid: She is taking the prescription 75 mcg daily. No change in her hair or skin and no weight changes or palpitations.  No chest pressure, heaviness, tightness, squeezing even with exertion. No increased shortness of breath/dyspnea on exertion.  She has no complaints or concerns today. Says that her current medications are working very well for her.  Past Medical History  Diagnosis Date  . Tobacco abuse   . Claudication (HCC)     stent in bil iliac  11/25/10  . PVD (peripheral vascular disease) (HCC)     lower ext and upper ext-L subclavian stenosis by ultrasound, nl nuclear test- 10/27/10  . Murmur, cardiac     echo 10/12/10- EF >55%, mod sclerotic aortic valve  . Hyperlipemia   . HTN (hypertension)   . Anxiety   . Hypothyroidism   . GERD (gastroesophageal reflux disease)   . Vitamin D deficiency      Home Meds:  Outpatient Prescriptions Prior to Visit  Medication Sig Dispense Refill  . amLODipine (NORVASC) 10 MG tablet TAKE 1 TABLET BY MOUTH DAILY 30 tablet 1  . aspirin 81 MG tablet Take 81 mg by mouth daily.    Marland Kitchen atorvastatin (LIPITOR) 10 MG tablet TAKE 1 TABLET BY MOUTH DAILY 30 tablet 0  . benazepril (LOTENSIN) 40 MG tablet Take 1 tablet (40 mg total) by mouth daily. 90 tablet 1  . cholecalciferol (VITAMIN D) 1000 UNITS tablet Take 2,000 Units by mouth daily.    . citalopram (CELEXA) 40 MG tablet TAKE 1 TABLET BY MOUTH DAILY 90 tablet 1  . diphenhydrAMINE (BENADRYL) 25 mg capsule Take 25 mg by mouth. 2 tabs at night    . levothyroxine (SYNTHROID, LEVOTHROID) 75 MCG tablet Take 1 tablet (75 mcg total) by mouth daily. 90 tablet 1  . montelukast (SINGULAIR) 10 MG tablet Take 1 tablet (10 mg total) by mouth at bedtime.  90 tablet 3  . pantoprazole (PROTONIX) 40 MG tablet Take 1 tablet (40 mg total) by mouth daily. 90 tablet 3  . valsartan (DIOVAN) 80 MG tablet Take 1 tablet (80 mg total) by mouth daily. 90 tablet 1  . VENTOLIN HFA 108 (90 BASE) MCG/ACT inhaler INHALE 2 PUFFS INTO THE LUNGS EVERY 6 (SIX) HOURS AS NEEDED FOR WHEEZING 8.5 g 5  . nebivolol (BYSTOLIC) 10 MG tablet Take 1 tablet (10 mg total) by mouth daily. (Patient not taking: Reported on 04/30/2015) 30 tablet 2  . clopidogrel (PLAVIX) 75 MG tablet Take 1 tablet (75 mg total) by mouth daily. (Patient not taking: Reported on 04/30/2015) 30 tablet 0   No facility-administered medications prior to visit.     Allergies:  Allergies  Allergen Reactions  . Banana   .  Chicken Allergy   . Demerol [Meperidine]   . Eggs Or Egg-Derived Products   . Metoprolol     asthma  . Milk-Related Compounds   . Orange Fruit [Citrus]   . Other     [place], pecans  . Pineapple   . Salmon [Fish Allergy]     And tuna and oysters  . Penicillins Rash    Social History   Social History  . Marital Status: Married    Spouse Name: N/A  . Number of Children: N/A  . Years of Education: N/A   Occupational History  . Not on file.   Social History Main Topics  . Smoking status: Former Smoker    Quit date: 02/28/2002  . Smokeless tobacco: Never Used  . Alcohol Use: Not on file  . Drug Use: Not on file  . Sexual Activity: Not on file   Other Topics Concern  . Not on file   Social History Narrative    History reviewed. No pertinent family history.   Review of Systems:  See HPI for pertinent ROS. All other ROS negative.    Physical Exam: Blood pressure 142/72, pulse 78, temperature 98 F (36.7 C), temperature source Oral, resp. rate 16, height 5' (1.524 m), weight 119 lb (53.978 kg)., Body mass index is 23.24 kg/(m^2). General: Very pleasant WF. Appears in no acute distress. Neck: Supple. No thyromegaly. No lymphadenopathy. Soft carotid bruits bilaterally. Lungs: Clear bilaterally to auscultation without wheezes, rales, or rhonchi. Breathing is unlabored. Heart: RRR with S1 S2. No murmurs, rubs, or gallops. Abdomen: Soft, non-tender, non-distended with normoactive bowel sounds. No hepatomegaly. No rebound/guarding. No obvious abdominal masses. Musculoskeletal:  Strength and tone normal for age. Extremities/Skin: Warm and dry. No LE edema.  Neuro: Alert and oriented X 3. Moves all extremities spontaneously. Gait is normal. CNII-XII grossly in tact. Psych:  Responds to questions appropriately with a normal affect.     ASSESSMENT AND PLAN:  70 y.o. year old female with  1. Anxiety Stable, controlled with Celexa .  2. Carotid artery disease Per Dr.  Allyson Sabal On ACE inhibitor, statin, Plavix, aspirin 81 mg Checked CBC 04/03/14 as she is on aspirin and Plavix.--CBC at that time was normal. Can wait to repeat. At OV 04/30/2015--- Explained to her the necessity for follow-up with Dr. Allyson Sabal and necessity for follow-up Dopplers. When she returns for her next appointment here, if she has not seen Dr. Allyson Sabal, then I'm going to need to schedule these Dopplers to make sure that we follow this.  3. GERD (gastroesophageal reflux disease) Well-controlled with her protonix but she does have to take this every day in order to control symptoms  4. HTN (hypertension) Blood pressure slighlty high at OV 04/29/2105 but it was excellent at LOV. Continue current medications and check lab to monitor. - COMPLETE METABOLIC PANEL WITH GFR  5. Hyperlipemia She is fasting. She is on Lipitor.  - COMPLETE METABOLIC PANEL WITH GFR - Lipid panel  6. Hypothyroidism On levothyroxine 75 mcg daily - TSH  7. Peripheral vascular disease with claudication Per Dr. Allyson Sabal.  At OV 04/29/2105--- Instructed her to call and schedule follow-up with Dr. Allyson Sabal - Lipid panel---was checked 04/03/14 and was controlled with triglyceride 204 HDL 40 LDL 93. - CBC was normal 04/03/14. This was checked at that time she is on aspirin and Plavix. Can wait to recheck this.  8. Subclavian artery stenosis, left Per Dr. Allyson Sabal At [facility] 04/29/2105--- Instructed her to call and schedule follow-up with Dr. Allyson Sabal - Lipid panel---was checked 04/03/14 and was controlled with triglyceride 204 HDL 40 LDL 93. - CBC was normal 04/03/14. This was checked at that time she is on aspirin and Plavix. Can wait to recheck this.  9. Vitamin D deficiency She is taking 2000 units daily - Vit D  25 hydroxy (rtn osteoporosis monitoring)----vitamin D level was checked 04/03/14 and was good at that time. Recheck 04/2015  10. Preventive health care: I have discussed mammogram/pelvic exam/Pap smear/Dexa/colonoscopy/immunizations with her  multiple times in the past and again today. She still defers. She is aware of risk and benefits of each procedure but still refuses.  Regular office visit and labs in 6 months or sooner if needed.   Signed, [address] [place], [place], [facility] 04/30/2015 1:25 PM

## 2015-05-01 ENCOUNTER — Encounter: Payer: Self-pay | Admitting: Family Medicine

## 2015-05-01 LAB — VITAMIN D 25 HYDROXY (VIT D DEFICIENCY, FRACTURES): VIT D 25 HYDROXY: 47 ng/mL (ref 30–100)

## 2015-05-04 ENCOUNTER — Ambulatory Visit: Payer: Medicare Other | Admitting: Physician Assistant

## 2015-05-04 ENCOUNTER — Other Ambulatory Visit: Payer: Self-pay | Admitting: Physician Assistant

## 2015-05-04 NOTE — Telephone Encounter (Signed)
Medication refilled per protocol. 

## 2015-05-05 ENCOUNTER — Encounter: Payer: Self-pay | Admitting: *Deleted

## 2015-05-09 ENCOUNTER — Other Ambulatory Visit: Payer: Self-pay | Admitting: Physician Assistant

## 2015-05-11 NOTE — Telephone Encounter (Signed)
Medication refilled per protocol. 

## 2015-06-03 ENCOUNTER — Other Ambulatory Visit: Payer: Self-pay | Admitting: Physician Assistant

## 2015-06-04 NOTE — Telephone Encounter (Signed)
Medication refilled per protocol. 

## 2015-06-17 ENCOUNTER — Other Ambulatory Visit: Payer: Self-pay | Admitting: Physician Assistant

## 2015-06-17 NOTE — Telephone Encounter (Signed)
Refill appropriate and filled per protocol. 

## 2015-11-06 ENCOUNTER — Other Ambulatory Visit: Payer: Self-pay

## 2015-11-06 MED ORDER — LEVOTHYROXINE SODIUM 75 MCG PO TABS: 75.0000 ug | ORAL_TABLET | Freq: Every day | ORAL | 0 refills | Status: DC

## 2015-12-02 ENCOUNTER — Ambulatory Visit: Payer: Medicare Other | Admitting: Physician Assistant

## 2015-12-03 ENCOUNTER — Encounter: Payer: Self-pay | Admitting: Physician Assistant

## 2015-12-03 ENCOUNTER — Ambulatory Visit (INDEPENDENT_AMBULATORY_CARE_PROVIDER_SITE_OTHER): Payer: Medicare Other | Admitting: Physician Assistant

## 2015-12-03 ENCOUNTER — Telehealth: Payer: Self-pay

## 2015-12-03 ENCOUNTER — Other Ambulatory Visit: Payer: Self-pay

## 2015-12-03 VITALS — BP 128/60 | HR 70 | Temp 97.9°F | Resp 16 | Wt 116.0 lb

## 2015-12-03 DIAGNOSIS — I771 Stricture of artery: Secondary | ICD-10-CM

## 2015-12-03 DIAGNOSIS — E039 Hypothyroidism, unspecified: Secondary | ICD-10-CM | POA: Diagnosis not present

## 2015-12-03 DIAGNOSIS — J309 Allergic rhinitis, unspecified: Secondary | ICD-10-CM

## 2015-12-03 DIAGNOSIS — E785 Hyperlipidemia, unspecified: Secondary | ICD-10-CM

## 2015-12-03 DIAGNOSIS — I739 Peripheral vascular disease, unspecified: Secondary | ICD-10-CM

## 2015-12-03 DIAGNOSIS — I10 Essential (primary) hypertension: Secondary | ICD-10-CM | POA: Diagnosis not present

## 2015-12-03 DIAGNOSIS — J4520 Mild intermittent asthma, uncomplicated: Secondary | ICD-10-CM | POA: Diagnosis not present

## 2015-12-03 DIAGNOSIS — E559 Vitamin D deficiency, unspecified: Secondary | ICD-10-CM | POA: Diagnosis not present

## 2015-12-03 DIAGNOSIS — F419 Anxiety disorder, unspecified: Secondary | ICD-10-CM | POA: Diagnosis not present

## 2015-12-03 DIAGNOSIS — K219 Gastro-esophageal reflux disease without esophagitis: Secondary | ICD-10-CM

## 2015-12-03 DIAGNOSIS — I779 Disorder of arteries and arterioles, unspecified: Secondary | ICD-10-CM

## 2015-12-03 LAB — TSH: TSH: 5.89 m[IU]/L — AB

## 2015-12-03 MED ORDER — BENAZEPRIL HCL 40 MG PO TABS: 40.0000 mg | ORAL_TABLET | Freq: Every day | ORAL | 3 refills | Status: DC

## 2015-12-03 MED ORDER — VALSARTAN 80 MG PO TABS
80.0000 mg | ORAL_TABLET | Freq: Every day | ORAL | 3 refills | Status: DC
Start: 2015-12-03 — End: 2017-03-06

## 2015-12-03 MED ORDER — NEBIVOLOL HCL 10 MG PO TABS: 10.0000 mg | ORAL_TABLET | Freq: Every day | ORAL | 2 refills | Status: DC

## 2015-12-03 MED ORDER — AMLODIPINE BESYLATE 5 MG PO TABS: 5.0000 mg | ORAL_TABLET | Freq: Every day | ORAL | 3 refills | Status: DC

## 2015-12-03 MED ORDER — CITALOPRAM HYDROBROMIDE 40 MG PO TABS: 40.0000 mg | ORAL_TABLET | Freq: Every day | ORAL | 1 refills | Status: DC

## 2015-12-03 NOTE — Telephone Encounter (Signed)
RX refilled per protocol 

## 2015-12-03 NOTE — Telephone Encounter (Signed)
RX filled per protocol 

## 2015-12-03 NOTE — Progress Notes (Signed)
Patient ID: Rocco Paulsatricia Traynor MRN: [id number], DOB: 01-May-1945, 70 y.o. Date of Encounter: @DATE @  Chief Complaint:  Chief Complaint  Patient presents with  . Follow-up    HPI: 70 y.o. year old white female  presents a routine followup office visit.   At her office visit with me 04/03/14 she had continued to followup with Dr. Allyson SabalBerry regarding her PVD and has been having routine ultrasound tests to follow this also.  At OV 04/30/2015--- I reviewed that her last visit with him was 11/01/2013. Today I have told her to schedule f/u with him. Discussed need to have f/u U/S , Dopplers.  She voices understanding and agrees to call and schedule f/u with him.   At OV 12/03/2015---Reviewed that she still has not had follow-up with Dr. Allyson SabalBerry or for her ultrasound tests.                        At this visit I am scheduling her for carotid artery Dopplers myself.                        At this visit I have discussed with her the seriousness of the need for her to follow-up with this and she voices understanding and agrees.  GERD: She states that she does have to take her protonix every day or else she has heartburn symptoms. However with her protonix her symptoms are controlled.  Generalized anxiety disorder:   She is on Celexa 40 mg. She states this is working well and is controlling her anxiety very well. She is having no adverse effects. At OV 04/30/2015 she does report that it has recently been the anniversary of when her mother passed away.  She also reports that she is now the age that her mother was when she passed away and her daughter is the age that pt was when her mom passed away.  Says that this has been causing her some stress but says that she is able to deal with it okay and feels that her anxiety is controlled and stable.  Vitamin D deficiency: She is taking 2000 units daily.  Thyroid: She is taking the prescription 75 mcg daily. No change in her hair or skin and no weight changes or  palpitations.  No chest pressure, heaviness, tightness, squeezing even with exertion. No increased shortness of breath/dyspnea on exertion.  She has no complaints or concerns today. Says that her current medications are working very well for her.  Past Medical History:  Diagnosis Date  . Anxiety   . Claudication (HCC)    stent in bil iliac 11/25/10  . GERD (gastroesophageal reflux disease)   . HTN (hypertension)   . Hyperlipemia   . Hypothyroidism   . Murmur, cardiac    echo 10/12/10- EF >55%, mod sclerotic aortic valve  . PVD (peripheral vascular disease) (HCC)    lower ext and upper ext-L subclavian stenosis by ultrasound, nl nuclear test- 10/27/10  . Tobacco abuse   . Vitamin D deficiency      Home Meds:  Outpatient Medications Prior to Visit  Medication Sig Dispense Refill  . aspirin 81 MG tablet Take 81 mg by mouth daily.    Marland Kitchen. atorvastatin (LIPITOR) 10 MG tablet TAKE 1 TABLET BY MOUTH DAILY 90 tablet 1  . benazepril (LOTENSIN) 40 MG tablet TAKE 1 TABLET BY MOUTH DAILY 90 tablet 1  . cholecalciferol (VITAMIN D) 1000 UNITS tablet Take 2,000  Units by mouth daily.    . citalopram (CELEXA) 40 MG tablet TAKE 1 TABLET BY MOUTH DAILY 90 tablet 1  . diphenhydrAMINE (BENADRYL) 25 mg capsule Take 25 mg by mouth. 2 tabs at night    . levothyroxine (SYNTHROID, LEVOTHROID) 75 MCG tablet Take 1 tablet (75 mcg total) by mouth daily. 90 tablet 0  . montelukast (SINGULAIR) 10 MG tablet Take 1 tablet (10 mg total) by mouth at bedtime. 90 tablet 3  . pantoprazole (PROTONIX) 40 MG tablet Take 1 tablet (40 mg total) by mouth daily. 90 tablet 3  . VENTOLIN HFA 108 (90 BASE) MCG/ACT inhaler INHALE 2 PUFFS INTO THE LUNGS EVERY 6 (SIX) HOURS AS NEEDED FOR WHEEZING 8.5 g 5  . amLODipine (NORVASC) 10 MG tablet TAKE 1 TABLET BY MOUTH DAILY 30 tablet 3  . nebivolol (BYSTOLIC) 10 MG tablet Take 1 tablet (10 mg total) by mouth daily. (Patient not taking: Reported on 12/03/2015) 30 tablet 2  . valsartan  (DIOVAN) 80 MG tablet TAKE 1 TABLET BY MOUTH DAILY (Patient not taking: Reported on 12/03/2015) 90 tablet 1   No facility-administered medications prior to visit.      Allergies:  Allergies  Allergen Reactions  . Banana   . Chicken Allergy   . Demerol [Meperidine]   . Eggs Or Egg-Derived Products   . Metoprolol     asthma  . Milk-Related Compounds   . Orange Fruit [Citrus]   . Other     [place], pecans  . Pineapple   . Salmon [Fish Allergy]     And tuna and oysters  . Penicillins Rash    Social History   Social History  . Marital status: Married    Spouse name: N/A  . Number of children: N/A  . Years of education: N/A   Occupational History  . Not on file.   Social History Main Topics  . Smoking status: Former Smoker    Quit date: 02/28/2002  . Smokeless tobacco: Never Used  . Alcohol use Not on file  . Drug use: Unknown  . Sexual activity: Not on file   Other Topics Concern  . Not on file   Social History Narrative  . No narrative on file    No family history on file.   Review of Systems:  See HPI for pertinent ROS. All other ROS negative.    Physical Exam: Blood pressure 128/60, pulse 70, temperature 97.9 F (36.6 C), temperature source Oral, resp. rate 16, weight 116 lb (52.6 kg), SpO2 96 %., Body mass index is 22.65 kg/m. General: Very pleasant WF. Appears in no acute distress. Neck: Supple. No thyromegaly. No lymphadenopathy.  Loud, harsh carotid bruit on the right.  Soft, high pitch carotid bruit on the left. Lungs: Clear bilaterally to auscultation without wheezes, rales, or rhonchi. Breathing is unlabored. Heart: RRR with S1 S2. No murmurs, rubs, or gallops. Abdomen: Soft, non-tender, non-distended with normoactive bowel sounds. No hepatomegaly. No rebound/guarding. No obvious abdominal masses. Musculoskeletal:  Strength and tone normal for age. Extremities/Skin: Warm and dry. No LE edema.  Neuro: Alert and oriented X 3. Moves all extremities  spontaneously. Gait is normal. CNII-XII grossly in tact. Psych:  Responds to questions appropriately with a normal affect.     ASSESSMENT AND PLAN:  70 y.o. year old female with  1. Anxiety Stable, controlled with Celexa 40mg .  2. Carotid artery disease Per Dr. Allyson Sabal On ACE inhibitor, statin, Plavix, aspirin 81 mg Checked CBC 04/03/14 as she is  on aspirin and Plavix.--CBC at that time was normal. Can wait to repeat. At OV 04/30/2015--- Explained to her the necessity for follow-up with Dr. Allyson Sabal and necessity for follow-up Dopplers. When she returns for her next appointment here, if she has not seen Dr. Allyson Sabal, then I'm going to need to schedule these Dopplers to make sure that we follow this.  At her office visit with me 04/03/14 she had continued to followup with Dr. Allyson Sabal regarding her PVD and has been having routine ultrasound tests to follow this also.  At OV 04/30/2015--- I reviewed that her last visit with him was 11/01/2013. Today I have told her to schedule f/u with him. Discussed need to have f/u U/S , Dopplers.  She voices understanding and agrees to call and schedule f/u with him.   At OV 12/03/2015---Reviewed that she still has not had follow-up with Dr. Allyson Sabal or for her ultrasound tests.                        At this visit I am scheduling her for carotid artery Dopplers myself.                        At this visit I have discussed with her the seriousness of the need for her to follow-up with this and she voices understanding and agrees.  3. GERD (gastroesophageal reflux disease) Well-controlled with her protonix but she does have to take this every day in order to control symptoms  4. HTN (hypertension) Blood pressure at goal/controlled.  Continue current medications and check lab to monitor. - COMPLETE METABOLIC PANEL WITH GFR  5. Hyperlipemia She is fasting. She is on Lipitor.  - COMPLETE METABOLIC PANEL WITH GFR - Lipid panel  6. Hypothyroidism On levothyroxine 75 mcg  daily - TSH  7. Peripheral vascular disease with claudication Per Dr. Allyson Sabal.  At OV 04/29/2105--- Instructed her to call and schedule follow-up with Dr. Allyson Sabal - Lipid panel---was checked 04/03/14 and was controlled with triglyceride 204 HDL 40 LDL 93. - CBC was normal 04/03/14. This was checked at that time she is on aspirin and Plavix. Can wait to recheck this.  8. Subclavian artery stenosis, left Per Dr. Allyson Sabal At [facility] 04/29/2105--- Instructed her to call and schedule follow-up with Dr. Allyson Sabal - Lipid panel---was checked 04/03/14 and was controlled with triglyceride 204 HDL 40 LDL 93. - CBC was normal 04/03/14. This was checked at that time she is on aspirin and Plavix. Can wait to recheck this.  9. Vitamin D deficiency She is taking 2000 units daily - Vit D  25 hydroxy (rtn osteoporosis monitoring)----vitamin D level was checked 04/03/14 and was good at that time. Recheck 04/2015  10. Preventive health care: I have discussed mammogram/pelvic exam/Pap smear/Dexa/colonoscopy/immunizations with her multiple times in the past and again today. She still defers. She is aware of risk and benefits of each procedure but still refuses.  Ideally she needs follow-up office visit with Dr. Allyson Sabal and follow-up vascular Dopplers for #7 and #8 above in addition to the carotid Dopplers. However at this time she is deferring all of this so I have discussed that she at least needs to get the carotid Dopplers and she is agreeable to do so.  Regular office visit and labs in 6 months or sooner if needed.   [address] [place], [place], [facility] 12/03/2015 12:08 PM

## 2015-12-04 ENCOUNTER — Telehealth: Payer: Self-pay

## 2015-12-04 LAB — COMPLETE METABOLIC PANEL WITH GFR
ALT: 9 U/L (ref 6–29)
AST: 18 U/L (ref 10–35)
Albumin: 4.3 g/dL (ref 3.6–5.1)
Alkaline Phosphatase: 76 U/L (ref 33–130)
BUN: 10 mg/dL (ref 7–25)
CO2: 27 mmol/L (ref 20–31)
Calcium: 9.2 mg/dL (ref 8.6–10.4)
Chloride: 102 mmol/L (ref 98–110)
Creat: 0.88 mg/dL (ref 0.60–0.93)
GFR, EST NON AFRICAN AMERICAN: 66 mL/min (ref >=60)
GFR, Est African American: 76 mL/min (ref >=60)
GLUCOSE: 70 mg/dL (ref 70–99)
POTASSIUM: 4 mmol/L (ref 3.5–5.3)
SODIUM: 143 mmol/L (ref 135–146)
Total Bilirubin: 0.5 mg/dL (ref 0.2–1.2)
Total Protein: 6.9 g/dL (ref 6.1–8.1)

## 2015-12-04 LAB — LIPID PANEL
CHOL/HDL RATIO: 2.6 ratio (ref <=5.0)
Cholesterol: 196 mg/dL (ref 125–200)
HDL: 74 mg/dL (ref >=46)
LDL CALC: 99 mg/dL (ref <130)
TRIGLYCERIDES: 113 mg/dL (ref <150)
VLDL: 23 mg/dL (ref <30)

## 2015-12-04 NOTE — Telephone Encounter (Signed)
Spoke with pt provided details regarding appt with [facility] on 12-07-2015

## 2015-12-04 NOTE — Telephone Encounter (Signed)
Called pt to confirm her appt with [facility] on 12-07-2015. LVM to call bck

## 2015-12-07 ENCOUNTER — Ambulatory Visit (HOSPITAL_COMMUNITY)
Admission: RE | Admit: 2015-12-07 | Discharge: 2015-12-07 | Disposition: A | Discharge status: Home / Self Care | Payer: Medicare Other | Source: Ambulatory Visit | Attending: Cardiovascular Disease | Admitting: Cardiovascular Disease

## 2015-12-07 DIAGNOSIS — Z87891 Personal history of nicotine dependence: Secondary | ICD-10-CM | POA: Insufficient documentation

## 2015-12-07 DIAGNOSIS — E785 Hyperlipidemia, unspecified: Secondary | ICD-10-CM | POA: Diagnosis not present

## 2015-12-07 DIAGNOSIS — I739 Peripheral vascular disease, unspecified: Secondary | ICD-10-CM | POA: Diagnosis not present

## 2015-12-07 DIAGNOSIS — I10 Essential (primary) hypertension: Secondary | ICD-10-CM | POA: Insufficient documentation

## 2015-12-07 DIAGNOSIS — I6523 Occlusion and stenosis of bilateral carotid arteries: Secondary | ICD-10-CM | POA: Insufficient documentation

## 2015-12-07 DIAGNOSIS — I779 Disorder of arteries and arterioles, unspecified: Secondary | ICD-10-CM | POA: Diagnosis not present

## 2015-12-22 ENCOUNTER — Other Ambulatory Visit: Payer: Self-pay

## 2015-12-22 MED ORDER — ALBUTEROL SULFATE HFA 108 (90 BASE) MCG/ACT IN AERS: INHALATION_SPRAY | RESPIRATORY_TRACT | 5 refills | Status: DC

## 2015-12-22 NOTE — Telephone Encounter (Signed)
RX filled per protocol 

## 2015-12-23 ENCOUNTER — Other Ambulatory Visit: Payer: Self-pay

## 2016-01-15 ENCOUNTER — Other Ambulatory Visit: Payer: Self-pay

## 2016-01-15 DIAGNOSIS — K219 Gastro-esophageal reflux disease without esophagitis: Secondary | ICD-10-CM

## 2016-01-15 MED ORDER — PANTOPRAZOLE SODIUM 40 MG PO TBEC: 40.0000 mg | DELAYED_RELEASE_TABLET | Freq: Every day | ORAL | 1 refills | Status: DC

## 2016-01-15 NOTE — Telephone Encounter (Signed)
Rx refilled per protocol 

## 2016-02-15 ENCOUNTER — Other Ambulatory Visit: Payer: Self-pay | Admitting: Physician Assistant

## 2016-02-15 NOTE — Telephone Encounter (Signed)
RX filler per protocol

## 2016-03-24 ENCOUNTER — Other Ambulatory Visit: Payer: Self-pay | Admitting: Physician Assistant

## 2016-03-24 NOTE — Telephone Encounter (Signed)
Rx filled per protocol  

## 2016-05-13 ENCOUNTER — Other Ambulatory Visit: Payer: Self-pay | Admitting: Physician Assistant

## 2016-05-27 ENCOUNTER — Other Ambulatory Visit: Payer: Self-pay | Admitting: Physician Assistant

## 2016-05-30 NOTE — Telephone Encounter (Signed)
Refill appropriate 

## 2016-06-22 ENCOUNTER — Encounter: Payer: Self-pay | Admitting: Physician Assistant

## 2016-06-22 ENCOUNTER — Ambulatory Visit (INDEPENDENT_AMBULATORY_CARE_PROVIDER_SITE_OTHER): Payer: Medicare Other | Admitting: Physician Assistant

## 2016-06-22 VITALS — BP 140/80 | HR 78 | Temp 97.8°F | Resp 14 | Wt 112.4 lb

## 2016-06-22 DIAGNOSIS — L239 Allergic contact dermatitis, unspecified cause: Secondary | ICD-10-CM | POA: Diagnosis not present

## 2016-06-22 MED ORDER — METHYLPREDNISOLONE ACETATE 80 MG/ML IJ SUSP
80.0000 mg | Freq: Once | INTRAMUSCULAR | Status: AC
  Administered 2016-06-22: 80 mg via INTRAMUSCULAR

## 2016-06-22 MED ORDER — PREDNISONE 20 MG PO TABS: ORAL_TABLET | ORAL | 0 refills | Status: DC

## 2016-06-22 NOTE — Progress Notes (Signed)
Patient ID: Alexis [name] MRN: [id number], DOB: 1945/11/06, 71 y.o. Date of Encounter: 06/22/2016, 11:37 AM    Chief Complaint:  Chief Complaint  Patient presents with  . swelling around eyes    itching and painful   . Rash    all over body      HPI: 71 y.o. year old female presents with above.   She has history of multiple food allergies and other types of allergies in the past. Says that she has had similar type of allergic dermatitis in the past so that's why she did not come in sooner --- thought that it would eventually clear up but it has not.  Says that she did recently start some new facial products and thinks that is what has caused this.  Says that this started last week. At first noticed some itching around her right eye and then started to develop it around the left eye as well. Itching and rash have worsened to where it is at this point. Also has a few splotches of whelps/ rash on her neck and on her right arm that are very itchy.  Has had no tightness in her throat or difficulty breathing. No other concerns to address today.     Home Meds:   Outpatient Medications Prior to Visit  Medication Sig Dispense Refill  . albuterol (VENTOLIN HFA) 108 (90 Base) MCG/ACT inhaler INHALE 2 PUFFS INTO THE LUNGS EVERY 6 (SIX) HOURS AS NEEDED FOR WHEEZING 8.5 g 5  . amLODipine (NORVASC) 5 MG tablet Take 1 tablet (5 mg total) by mouth daily. 90 tablet 3  . aspirin 81 MG tablet Take 81 mg by mouth daily.    Marland Kitchen atorvastatin (LIPITOR) 10 MG tablet TAKE 1 TABLET BY MOUTH DAILY 90 tablet 1  . benazepril (LOTENSIN) 40 MG tablet Take 1 tablet (40 mg total) by mouth daily. 90 tablet 3  . BYSTOLIC 10 MG tablet TAKE 1 TABLET BY MOUTH DAILY 30 tablet 3  . cholecalciferol (VITAMIN D) 1000 UNITS tablet Take 2,000 Units by mouth daily.    . citalopram (CELEXA) 40 MG tablet TAKE 1 TABLET BY MOUTH DAILY 90 tablet 0  . diphenhydrAMINE (BENADRYL) 25 mg capsule Take 25 mg by mouth. 2 tabs at  night    . levothyroxine (SYNTHROID, LEVOTHROID) 75 MCG tablet Take 1 tablet by mouth once daily. 90 tablet 1  . montelukast (SINGULAIR) 10 MG tablet TAKE ONE TABLET BY MOUTH EVERY NIGHT AT BEDTIME 90 tablet 1  . pantoprazole (PROTONIX) 40 MG tablet Take 1 tablet (40 mg total) by mouth daily. 90 tablet 1  . valsartan (DIOVAN) 80 MG tablet Take 1 tablet (80 mg total) by mouth daily. 90 tablet 3   No facility-administered medications prior to visit.     Allergies:  Allergies  Allergen Reactions  . Banana   . Chicken Allergy   . Demerol [Meperidine]   . Eggs Or Egg-Derived Products   . Metoprolol     asthma  . Milk-Related Compounds   . Orange Fruit [Citrus]   . Other     [place], pecans  . Pineapple   . Salmon [Fish Allergy]     And tuna and oysters  . Penicillins Rash      Review of Systems: See HPI for pertinent ROS. All other ROS negative.    Physical Exam: Blood pressure 140/80, pulse 78, temperature 97.8 F (36.6 C), temperature source Oral, resp. rate 14, weight 112 lb 6.4 oz (51 kg), SpO2  98 %., Body mass index is 21.95 kg/m. General:  WNWD WF. Appears in no acute distress. Neck: Supple. No thyromegaly. No lymphadenopathy. Lungs: Clear bilaterally to auscultation without wheezes, rales, or rhonchi. Breathing is unlabored. Heart: Regular rhythm. No murmurs, rubs, or gallops. Msk:  Strength and tone normal for age. Skin: She has diffuse pink erythema above and below both eyes---- extends to eyebrows above each eye and extends to zygomatic bone--- has small amount of splotchy urticaria around periphery of these areas--- also has splotches of urticaria on right arm and smaller/lighter areas of urticaria on neck and upper chest Eyes are open--not swollen shut Neuro: Alert and oriented X 3. Moves all extremities spontaneously. Gait is normal. CNII-XII grossly in tact. Psych:  Responds to questions appropriately with a normal affect.     ASSESSMENT AND PLAN:  71 y.o.  year old female with  1. Allergic dermatitis --Give DepoMedrol  IM now.  --Start oral prednisone taper tomorrow. F/U if worsens, does not improve, does not resolve within one week. - predniSONE (DELTASONE) 20 MG tablet; Take 3 daily for 2 days, then 2 daily for 2 days, then 1 daily for 2 days.  Dispense: 12 tablet; Refill: 0   Signed, [address] [place], [place], [facility] 06/22/2016 11:37 AM

## 2016-06-22 NOTE — Addendum Note (Signed)
Addended by: Phineas Semen A on: 06/22/2016 12:28 PM   Modules accepted: Orders

## 2016-06-28 ENCOUNTER — Other Ambulatory Visit: Payer: Self-pay | Admitting: Physician Assistant

## 2016-07-05 ENCOUNTER — Other Ambulatory Visit: Payer: Self-pay | Admitting: Physician Assistant

## 2016-07-05 DIAGNOSIS — K219 Gastro-esophageal reflux disease without esophagitis: Secondary | ICD-10-CM

## 2016-07-05 NOTE — Telephone Encounter (Signed)
Refill appropriate 

## 2016-07-21 ENCOUNTER — Ambulatory Visit (INDEPENDENT_AMBULATORY_CARE_PROVIDER_SITE_OTHER): Payer: Medicare Other | Admitting: Physician Assistant

## 2016-07-21 ENCOUNTER — Encounter: Payer: Self-pay | Admitting: Physician Assistant

## 2016-07-21 VITALS — BP 150/60 | HR 68 | Temp 98.4°F | Resp 16 | Wt 112.6 lb

## 2016-07-21 DIAGNOSIS — L239 Allergic contact dermatitis, unspecified cause: Secondary | ICD-10-CM | POA: Diagnosis not present

## 2016-07-21 MED ORDER — METHYLPREDNISOLONE ACETATE 80 MG/ML IJ SUSP
80.0000 mg | Freq: Once | INTRAMUSCULAR | Status: AC
  Administered 2016-07-21: 80 mg via INTRAMUSCULAR

## 2016-07-21 MED ORDER — PREDNISONE 20 MG PO TABS: ORAL_TABLET | ORAL | 0 refills | Status: DC

## 2016-07-21 NOTE — Progress Notes (Signed)
Patient ID: Alexis [name] MRN: [id number], DOB: 31-Jul-1945, 71 y.o. Date of Encounter: 07/21/2016, 2:38 PM    Chief Complaint:  Chief Complaint  Patient presents with  . Rash    around eyes     HPI: 71 y.o. year old female presents with above.   Reviewed my office note with her from 06/22/16. She has history of multiple allergies including allergies to multiple foods. At that visit she had recently used some new skin care products on her face and came in with significant rash on her face and a few areas on other parts of her body. At that visit gave Depo-Medrol 80 mg IM and then had her start an oral prednisone taper the following day.  Today she reports that that rash did completely resolve. Says that it did take several days for it to clear -- says that it was probably around the time that she got to her last dose of prednisone that it was finally clearing.  States that she recently applied some eye cream and then on Monday 07/18/16 also applied some mascara and that after that developed this rash around both eyes. Rash is extremely itchy and is around both eyes. She also has area of itchy rash on her right forearm and on her left upper arm.  Has had no itching or swelling of the throat no difficulty breathing no other complaints or concerns.    Home Meds:   Outpatient Medications Prior to Visit  Medication Sig Dispense Refill  . albuterol (VENTOLIN HFA) 108 (90 Base) MCG/ACT inhaler INHALE 2 PUFFS INTO THE LUNGS EVERY 6 (SIX) HOURS AS NEEDED FOR WHEEZING 8.5 g 5  . amLODipine (NORVASC) 5 MG tablet Take 1 tablet (5 mg total) by mouth daily. 90 tablet 3  . aspirin 81 MG tablet Take 81 mg by mouth daily.    Marland Kitchen. atorvastatin (LIPITOR) 10 MG tablet TAKE 1 TABLET BY MOUTH DAILY 90 tablet 1  . benazepril (LOTENSIN) 40 MG tablet Take 1 tablet (40 mg total) by mouth daily. 90 tablet 3  . BYSTOLIC 10 MG tablet TAKE 1 TABLET BY MOUTH DAILY 30 tablet 3  . cholecalciferol (VITAMIN D) 1000  UNITS tablet Take 2,000 Units by mouth daily.    . citalopram (CELEXA) 40 MG tablet TAKE 1 TABLET BY MOUTH DAILY 90 tablet 0  . diphenhydrAMINE (BENADRYL) 25 mg capsule Take 25 mg by mouth. 2 tabs at night    . levothyroxine (SYNTHROID, LEVOTHROID) 75 MCG tablet Take 1 tablet by mouth once daily. 90 tablet 1  . montelukast (SINGULAIR) 10 MG tablet TAKE ONE TABLET BY MOUTH EVERY NIGHT AT BEDTIME 90 tablet 1  . pantoprazole (PROTONIX) 40 MG tablet TAKE 1 TABLET BY MOUTH DAILY 90 tablet 2  . valsartan (DIOVAN) 80 MG tablet Take 1 tablet (80 mg total) by mouth daily. 90 tablet 3  . predniSONE (DELTASONE) 20 MG tablet Take 3 daily for 2 days, then 2 daily for 2 days, then 1 daily for 2 days. 12 tablet 0   No facility-administered medications prior to visit.     Allergies:  Allergies  Allergen Reactions  . Banana   . Chicken Allergy   . Demerol [Meperidine]   . Eggs Or Egg-Derived Products   . Metoprolol     asthma  . Milk-Related Compounds   . Orange Fruit [Citrus]   . Other     [place], pecans  . Pineapple   . Carmie KannerSalmon [Fish Allergy]  And tuna and oysters  . Penicillins Rash      Review of Systems: See HPI for pertinent ROS. All other ROS negative.    Physical Exam: Blood pressure (!) 150/60, pulse 68, temperature 98.4 F (36.9 C), temperature source Oral, resp. rate 16, weight 112 lb 9.6 oz (51.1 kg), SpO2 95 %., Body mass index is 21.99 kg/m. General:  WNWD WF. Appears in no acute distress. Neck: Supple. No thyromegaly. No lymphadenopathy. Lungs: Clear bilaterally to auscultation without wheezes, rales, or rhonchi. Breathing is unlabored. Heart: Regular rhythm. No murmurs, rubs, or gallops. Msk:  Strength and tone normal for age. Skin: Upper Eye lids and skin around sides of eyes and beneath eyes-- ~ 1/2 inch below eyes---this entire area with diffuse erythema. No swelling. She has one patch of scaly erythematous skin on her right forearm that is about 1 cm diameter. She has  area on lateral left upper arm that is light pink erythema with papules. Neuro: Alert and oriented X 3. Moves all extremities spontaneously. Gait is normal. CNII-XII grossly in tact. Psych:  Responds to questions appropriately with a normal affect.     ASSESSMENT AND PLAN:  71 y.o. year old female with  1. Allergic dermatitis Discussed giving her lower dose of prednisone with this compared to her visit 06/22/16. However she states that she feels that she is going to need that same dose again. Says that last time it took about 4 or 5 days for rash to start clearing up. Give Depo-Medrol 80 mg IM here now. She will start the oral prednisone taper tomorrow. Will complete this as directed. Follow-up if symptoms worsen or do not resolve upon completion of the prednisone taper. - predniSONE (DELTASONE) 20 MG tablet; Take 3 daily for 2 days, then 2 daily for 2 days, then 1 daily for 2 days.  Dispense: 12 tablet; Refill: 0   Signed, [address] [place], [place], [facility] 07/21/2016 2:38 PM

## 2016-07-21 NOTE — Addendum Note (Signed)
Addended by: Phineas SemenJOHNSON, TIFFANY A on: 07/21/2016 05:18 PM   Modules accepted: Orders

## 2016-08-17 ENCOUNTER — Other Ambulatory Visit: Payer: Self-pay | Admitting: Physician Assistant

## 2016-08-17 NOTE — Telephone Encounter (Signed)
Refill appropriate 

## 2016-08-18 ENCOUNTER — Telehealth: Payer: Self-pay | Admitting: Physician Assistant

## 2016-08-18 DIAGNOSIS — J309 Allergic rhinitis, unspecified: Secondary | ICD-10-CM

## 2016-08-18 NOTE — Telephone Encounter (Signed)
Pls see note below will put in referral

## 2016-08-18 NOTE — Telephone Encounter (Signed)
Approved. Can place order for referral to allergist.

## 2016-08-18 NOTE — Telephone Encounter (Signed)
Patient is having rxn around her eyes again would like a referral to allergy and asthma specialists to see dr Alexis [name] if possible

## 2016-08-19 ENCOUNTER — Other Ambulatory Visit: Payer: Self-pay | Admitting: Physician Assistant

## 2016-08-19 ENCOUNTER — Telehealth: Payer: Self-pay | Admitting: *Deleted

## 2016-08-19 DIAGNOSIS — L239 Allergic contact dermatitis, unspecified cause: Secondary | ICD-10-CM

## 2016-08-19 NOTE — Telephone Encounter (Signed)
Pt phoned asking about referral to allergy specialist. Made appointment for 6/27 at 9:30 am. Pt wanting to know if she can have more prednisone to get her thru until this appt?

## 2016-08-24 ENCOUNTER — Encounter: Payer: Self-pay | Admitting: Allergy & Immunology

## 2016-08-24 ENCOUNTER — Ambulatory Visit (INDEPENDENT_AMBULATORY_CARE_PROVIDER_SITE_OTHER): Payer: Medicare Other | Admitting: Allergy & Immunology

## 2016-08-24 VITALS — BP 108/60 | HR 90 | Temp 97.6°F | Resp 17 | Ht 59.5 in | Wt 110.6 lb

## 2016-08-24 DIAGNOSIS — J310 Chronic rhinitis: Secondary | ICD-10-CM | POA: Diagnosis not present

## 2016-08-24 DIAGNOSIS — Z87891 Personal history of nicotine dependence: Secondary | ICD-10-CM | POA: Diagnosis not present

## 2016-08-24 DIAGNOSIS — J4540 Moderate persistent asthma, uncomplicated: Secondary | ICD-10-CM | POA: Diagnosis not present

## 2016-08-24 DIAGNOSIS — L253 Unspecified contact dermatitis due to other chemical products: Secondary | ICD-10-CM | POA: Diagnosis not present

## 2016-08-24 NOTE — Progress Notes (Addendum)
NEW PATIENT  Date of Service/Encounter:  08/24/16  Referring provider: Dorena Bodoixon, Mary B, PA-C   Assessment:   Contact dermatitis due to chemicals  Moderate persistent asthma with likely COPD overlap  80 pack-year history of smoking  Chronic rhinitis    Asthma Reportables:  Severity: moderate persistent with COPD overlap  Risk: high Control: not well controlled     Plan/Recommendations:   1. Likely contact dermatitis - Start Pazeo one drop eye daily. - Start the prednisone pack provided.  - Start Xyzal 5mg  daily to help with the itching.  - Start Eucrisa one application twice daily as needed for itching (safe to use on face). - I feel that your eye symptoms are likely due to a reaction to cosmetics in your fingernails or eye shadow. - We will schedule you for placement of patch testing to the True [facility] panel on the week of July 9th.  - We will see you next week on July 5th at 8:30am for food allergy testing.    2. Moderate persistent asthma, uncomplicated - Lung function looked like COPD or asthma today, but there was some mild improvement with the albuterol treatment. - I would highly recommend starting an inhaled steroids and long-acting albuterol: Breo 200/25 one puff once daily - Daily controller medication(s): Breo 200/25 one puff once daily + Singulair 10mg  daily - Rescue medications: albuterol 4 puffs every 4-6 hours as needed - Asthma control goals:  * Full participation in all desired activities (may need albuterol before activity) * Albuterol use two time or less a week on average (not counting use with activity) * Cough interfering with sleep two time or less a month * Oral steroids no more than once a year * No hospitalizations  3. Perennial allergic rhinitis - Start Nasacort one spray per nostril daily to help with nasal inflammation. - Hopefully this will help you breath through your nose.  - We will blood work to look for evidence of environmental  allergies.   4. Return in about 8 days (around 09/01/2016).   Subjective:   Alexis [name] is a 71 y.o. female presenting today for evaluation of  Chief Complaint  Patient presents with  . Allergies    Alexis [name] has a history of the following: Patient Active Problem List   Diagnosis Date Noted  . History of smoking greater than 50 pack years 08/24/2016  . Moderate persistent asthma, uncomplicated 08/24/2016  . Allergic rhinitis 06/11/2014  . Asthma with acute exacerbation 06/11/2014  . Extrinsic asthma 06/11/2014  . Hyperlipemia   . HTN (hypertension)   . Anxiety   . Hypothyroidism   . GERD (gastroesophageal reflux disease)   . Vitamin D deficiency   . Peripheral vascular disease with claudication (HCC) 07/19/2012  . Carotid artery disease (HCC) 07/19/2012  . Subclavian artery stenosis, left (HCC) 07/19/2012    History obtained from: chart review and patient and her husband.  Alexis [name] was referred by Dorena Bodoixon, Mary B, PA-C.     Alexis [name] is a 71 y.o. female presenting for evaluation of bilateral ocular itching and a periorbital rash. This started around six weeks ago, when she developed itchy red burning eyes. This is the first year that this has ever happened. Since it first emerged, it is recurred three times. She was put on prednisone for a few days at a time, with immediate improvement in her symptoms. Then it would come back shortly after. She initially contacted her ophthalmologist (Dr. Harriette BouillonMcFarland), who said that he felt  that it was allergic since it involved the skin around the eyes. He did send in a prescription for an eye drop, unknown name, which did not provide much help. Then she went to see her PCP, where she as given the courses of prednisone with improvement in her symptoms. Benadryl has helped with the itching, but prednisone provided the most relief.   She does use makeup. At first she thought it was related to the makeup, so she changed brands. However, the  eyes continued to flare despite this change. She tells me today that it is something that she is eating, although she is unable to pin down a particular food. She has been drinking more grape juice lately. She has an extensive history of food allergies, including the following:   Chicken and [place] - "allergy stomach ache"  Eggs - "allergy stomach ache" Pinepple - "allergy stomach ache" Orange - respiratory allergen Salmon - migraine headache  Oyster - "allergy stomach ache" Banana - "allergy stomach ache" Fresh peaches - "allergy stomach ache" (but she can tolerate peach ice cream and peach jelly)   Ms. Luellen does have rhinorrhea, although it is difficult to gauge how severe this is or if there is a seasonal pattern to it. She has never been allergy tested in the past. She does not use any nasal sprays. She does have a history of asthma, and was on Breo for a period of time. But she reports that she developed a sore throat. She says it may have been Symbicort. Her history is not great. She has never seen a pulmonologist and her breathing problems have all been managed by her PCP. She smoked for 40 years, two packs per day. She stopped cold [place] in 2004.   Otherwise, there is no history of other atopic diseases, including drug allergies, stinging insect allergies, or urticaria. There is no significant infectious history. Vaccinations are up to date.    Past Medical History: Patient Active Problem List   Diagnosis Date Noted  . History of smoking greater than 50 pack years 08/24/2016  . Moderate persistent asthma, uncomplicated 08/24/2016  . Allergic rhinitis 06/11/2014  . Asthma with acute exacerbation 06/11/2014  . Extrinsic asthma 06/11/2014  . Hyperlipemia   . HTN (hypertension)   . Anxiety   . Hypothyroidism   . GERD (gastroesophageal reflux disease)   . Vitamin D deficiency   . Peripheral vascular disease with claudication (HCC) 07/19/2012  . Carotid artery disease (HCC)  07/19/2012  . Subclavian artery stenosis, left (HCC) 07/19/2012    Medication List:  Allergies as of 08/24/2016      Reactions   Banana    Chicken Allergy    Demerol [meperidine]    Eggs Or Egg-derived Products    Metoprolol    asthma   Milk-related Compounds    Orange Fruit [citrus]    Other    [place], pecans   Pineapple    [place] [fish Allergy]    And tuna and oysters   Penicillins Rash      Medication List       Accurate as of 08/24/16 11:54 PM. Always use your most recent med list.          albuterol 108 (90 Base) MCG/ACT inhaler Commonly known as:  VENTOLIN HFA INHALE 2 PUFFS INTO THE LUNGS EVERY 6 (SIX) HOURS AS NEEDED FOR WHEEZING   aspirin 81 MG tablet Take 81 mg by mouth daily.   atorvastatin 10 MG tablet Commonly known as:  LIPITOR TAKE 1 TABLET BY MOUTH DAILY   benazepril 40 MG tablet Commonly known as:  LOTENSIN Take 1 tablet (40 mg total) by mouth daily.   BYSTOLIC 10 MG tablet Generic drug:  nebivolol TAKE 1 TABLET BY MOUTH DAILY   cholecalciferol 1000 units tablet Commonly known as:  VITAMIN D Take 2,000 Units by mouth daily.   citalopram 40 MG tablet Commonly known as:  CELEXA TAKE 1 TABLET BY MOUTH DAILY   diphenhydrAMINE 25 mg capsule Commonly known as:  BENADRYL Take 25 mg by mouth. 2 tabs at night   levothyroxine 75 MCG tablet Commonly known as:  SYNTHROID, LEVOTHROID TAKE 1 TABLET BY MOUTH DAILY   montelukast 10 MG tablet Commonly known as:  SINGULAIR TAKE ONE TABLET BY MOUTH EVERY NIGHT AT BEDTIME   olopatadine 0.1 % ophthalmic solution Commonly known as:  PATANOL   pantoprazole 40 MG tablet Commonly known as:  PROTONIX TAKE 1 TABLET BY MOUTH DAILY   predniSONE 20 MG tablet Commonly known as:  DELTASONE Take 3 daily for 2 days, then 2 daily for 2 days, then 1 daily for 2 days.   valsartan 80 MG tablet Commonly known as:  DIOVAN Take 1 tablet (80 mg total) by mouth daily.       Birth History: non-contributory.     Developmental History: non-contributory.   Past Surgical History: Past Surgical History:  Procedure Laterality Date  . peripheral angio  11/25/10   right iliac-6x27 Express balloon expandable stent; Left iliac 6x37     Family History: History reviewed. No pertinent family history.   Social History: Lumen lives at home with her husband. There is no smoking exposure at this time. She does have cats, but she has had cats for years. There is gas heating and central cooling. She is retired.      Review of Systems: a 14-point review of systems is pertinent for what is mentioned in HPI.  Otherwise, all other systems were negative. Constitutional: negative other than that listed in the HPI Eyes: negative other than that listed in the HPI Ears, nose, mouth, throat, and face: negative other than that listed in the HPI Respiratory: negative other than that listed in the HPI Cardiovascular: negative other than that listed in the HPI Gastrointestinal: negative other than that listed in the HPI Genitourinary: negative other than that listed in the HPI Integument: negative other than that listed in the HPI Hematologic: negative other than that listed in the HPI Musculoskeletal: negative other than that listed in the HPI Neurological: negative other than that listed in the HPI Allergy/Immunologic: negative other than that listed in the HPI    Objective:   Blood pressure 108/60, pulse 90, temperature 97.6 F (36.4 C), resp. rate 17, height 4' 11.5" (1.511 m), weight 110 lb 9.6 oz (50.2 kg), SpO2 90 %. Body mass index is 21.96 kg/m.   Physical Exam:  General: Alert, interactive, in no acute distress. Small, frail female.  Eyes: Marked periorbital erythema bilaterally, Conjunctival injection on the right with limbal sparing, Conjunctival injection on the left with limbal sparing, PERRL bilaterally, No discharge on the right, No discharge on the left, No Horner-Trantas dots present and  allergic shiners present bilaterally Ears: Right TM pearly gray with normal light reflex, Left TM pearly gray with normal light reflex, Right TM intact without perforation and Left TM intact without perforation.  Nose/Throat: External nose within normal limits, nasal crease present and septum midline, turbinates edematous with clear discharge, post-pharynx erythematous with  cobblestoning in the posterior oropharynx. Tonsils 2+ without exudates Neck: Supple without thyromegaly.  Adenopathy: Shoddy bilateral anterior cervical lymphadenopathy. and No enlarged lymph nodes appreciated in the occipital, axillary, epitrochlear, inguinal, or popliteal regions. Lungs: Clear to auscultation without wheezing, rhonchi or rales. No increased work of breathing. Decreased air movement at the bases.  CV: Normal S1/S2, no murmurs. Capillary refill <2 seconds.  Abdomen: Nondistended, nontender. No guarding or rebound tenderness. Bowel sounds present in all fields and hyperactive  Skin: Warm and dry, without lesions or rashes aside from the erythema around the bilateral eyes.  Extremities:  No clubbing, cyanosis or edema. Neuro:   Grossly intact. No focal deficits appreciated. Responsive to questions.  Diagnostic studies:   Spirometry: results abnormal (FEV1: 0.88/49%, FVC: 1.88/83%, FEV1/FVC: 46%).    Spirometry consistent with moderate obstructive disease. Albuterol/Atrovent nebulizer treatment given in clinic with improvement, although it did not quite meet American Thoracic Society criteria for significance.   Allergy Studies: none     Malachi Bonds, MD [facility]

## 2016-08-24 NOTE — Patient Instructions (Addendum)
1. Contact dermatitis - Start Pazeo one drop eye daily. - Start the prednisone pack provided.  - Start Xyzal 5mg  daily to help with the itching.  - Start Eucrisa one application twice daily as needed for itching (safe to use on face). - I feel that your eye symptoms are likely due to a reaction to cosmetics in your fingernails or eye shadow. - We will schedule you for placement of patch testing the week of July 9th.  - We will see you next week on July 5th at 8:30am for food allergy testing.  - This will test for allergies to cosmetics and chemicals.   - We can also do skin testing to our food panel at a future visit.   2. Moderate persistent asthma, uncomplicated - Lung function looked like COPD or asthma today, but there was some mild improvement with the albuterol treatment. - I would highly recommend starting an inhaled steroids and long-acting albuterol: Breo 200/25 one puff once daily. - Daily controller medication(s): Breo 200/25 one puff once daily + Singulair 10mg  daily - Rescue medications: albuterol 4 puffs every 4-6 hours as needed - Asthma control goals:  * Full participation in all desired activities (may need albuterol before activity) * Albuterol use two time or less a week on average (not counting use with activity) * Cough interfering with sleep two time or less a month * Oral steroids no more than once a year * No hospitalizations  3. Perennial allergic rhinitis - Start Nasacort one spray per nostril daily to help with nasal inflammation. - Hopefully this will help you breath through your nose.  - We will blood work to look for evidence of environmental allergies.   4. Return in about 8 days (around 09/01/2016).   Please inform us of any Emergency Department visits, hospitalizations, or changes in symptoms. Call us before going to the ED for breathing or allergy symptoms since we might be able to fit you in for a sick visit. Feel free to contact us anytime with any  questions, problems, or concerns.  It was a pleasure to meet you and your family today! Happy summer!   Websites that have reliable patient information: 1. American Academy of Asthma, Allergy, and Immunology: www.aaaai.org 2. Food Allergy Research and Education (FARE): foodallergy.org 3. Mothers of Asthmatics: http://www.asthmacommunitynetwork.org 4. American College of Allergy, Asthma, and Immunology: www.acaai.org  -

## 2016-08-25 LAB — CP584 ZONE 3
ALLERGEN, D PTERNOYSSINUS, D1: 4.08 kU/L — AB
Allergen, A. alternata, m6: <0.1 kU/L
Allergen, Black Locust, Acacia9: <0.1 kU/L
Allergen, Cedar tree, t12: <0.1 kU/L
Allergen, Comm Silver Birch, t9: <0.1 kU/L
Allergen, P. notatum, m1: <0.1 kU/L
Allergen, S. Botryosum, m10: <0.1 kU/L
Cat Dander: 58.1 kU/L — ABNORMAL HIGH
D. FARINAE: 4.24 kU/L — AB
DOG DANDER: 2.37 kU/L — AB
Plantain: <0.1 kU/L
Rough Pigweed  IgE: <0.1 kU/L

## 2016-08-30 ENCOUNTER — Other Ambulatory Visit: Payer: Self-pay | Admitting: Physician Assistant

## 2016-08-30 NOTE — Telephone Encounter (Signed)
Refill appropriate 

## 2016-09-01 ENCOUNTER — Other Ambulatory Visit: Payer: Self-pay

## 2016-09-01 ENCOUNTER — Ambulatory Visit (INDEPENDENT_AMBULATORY_CARE_PROVIDER_SITE_OTHER): Payer: Medicare Other | Admitting: Allergy & Immunology

## 2016-09-01 ENCOUNTER — Encounter: Payer: Self-pay | Admitting: Allergy & Immunology

## 2016-09-01 VITALS — BP 112/60 | HR 86 | Temp 98.4°F | Resp 18

## 2016-09-01 DIAGNOSIS — J4540 Moderate persistent asthma, uncomplicated: Secondary | ICD-10-CM | POA: Diagnosis not present

## 2016-09-01 DIAGNOSIS — L253 Unspecified contact dermatitis due to other chemical products: Secondary | ICD-10-CM | POA: Diagnosis not present

## 2016-09-01 DIAGNOSIS — J3089 Other allergic rhinitis: Secondary | ICD-10-CM

## 2016-09-01 DIAGNOSIS — T781XXD Other adverse food reactions, not elsewhere classified, subsequent encounter: Secondary | ICD-10-CM | POA: Diagnosis not present

## 2016-09-01 MED ORDER — FLUTICASONE FUROATE-VILANTEROL 200-25 MCG/INH IN AEPB: 1.0000 | INHALATION_SPRAY | Freq: Every day | RESPIRATORY_TRACT | 5 refills | Status: DC

## 2016-09-01 MED ORDER — PIMECROLIMUS 1 % EX CREA: TOPICAL_CREAM | Freq: Two times a day (BID) | CUTANEOUS | 3 refills | Status: DC | PRN

## 2016-09-01 MED ORDER — CRISABOROLE 2 % EX OINT: 1.0000 "application " | TOPICAL_OINTMENT | Freq: Two times a day (BID) | CUTANEOUS | 5 refills | Status: DC

## 2016-09-01 NOTE — Progress Notes (Signed)
VIALS EXP 09-01-17 

## 2016-09-01 NOTE — Patient Instructions (Addendum)
1. Contact dermatitis - Continue with Pazeo one drop eye daily. - Continue with Xyzal 5mg  daily to help with the itching.  - Continue with Eucrisa one application twice daily as needed for itching (safe to use on face). - We will see you next week for patch testing on July 9th.  - Testing to all of our foods was negative. - Entire food panel included: Peanut, Soy, Wheat, Corn, Milk, Egg, Casein, Shellfish Mix, Fish Mix, Cashew, Tomato, Orange, Rice, [employer], [place], [place], [place], Chicken, White Potato, Banana, [place], Peach, Sweet Potato, Green Pea, Strawberry, Onion, Cabbage, Carrots, Celery, Karaya Gum, Acacia (Arabic Gum), Cottonseed, Flounder, Trout, Shrimp, [place], [place], [place], [place], [place], [place], [place], [place], Cucumber, [address], [place], [place], [place], [place], [place], [place], [place] nut, [place], Cinnamon, Nutmeg, Ginger, Garlic, Black Pepper, Mustard, Barley, Oat, Rye, Sesame and Pineapple   2. Moderate persistent asthma, uncomplicated - Lung function looked much better today.  - We will continue with the Breo 200/25 one puff once daily. - Daily controller medication(s): Breo 200/25 one puff once daily + Singulair 10mg  daily - Rescue medications: albuterol 4 puffs every 4-6 hours as needed - Asthma control goals:  * Full participation in all desired activities (may need albuterol before activity) * Albuterol use two time or less a week on average (not counting use with activity) * Cough interfering with sleep two time or less a month * Oral steroids no more than once a year * No hospitalizations  3. Perennial allergic rhinitis (cat, dog, dust mite) - Continue with Nasacort one spray per nostril daily to help with nasal inflammation. - Immunotherapy Consent signed. - Make an appointment in two weeks for your first injection.   4. Return in about 4 days (around 09/05/2016).   Please inform us of any Emergency Department visits, hospitalizations, or changes  in symptoms. Call us before going to the ED for breathing or allergy symptoms since we might be able to fit you in for a sick visit. Feel free to contact us anytime with any questions, problems, or concerns.  It was a pleasure to see you and your family again today!   Websites that have reliable patient information: 1. American Academy of Asthma, Allergy, and Immunology: www.aaaai.org 2. Food Allergy Research and Education (FARE): foodallergy.org 3. Mothers of Asthmatics: http://www.asthmacommunitynetwork.org 4. American College of Allergy, Asthma, and Immunology: www.acaai.org   Control of House Dust Mite Allergen    House dust mites play a major role in allergic asthma and rhinitis.  They occur in environments with high humidity wherever human skin, the food for dust mites is found. High levels have been detected in dust obtained from mattresses, pillows, carpets, upholstered furniture, bed covers, clothes and soft toys.  The principal allergen of the house dust mite is found in its feces.  A gram of dust may contain 1,000 mites and 250,000 fecal particles.  Mite antigen is easily measured in the air during house cleaning activities.    1. Encase mattresses, including the box spring, and pillow, in an air tight cover.  Seal the zipper end of the encased mattresses with wide adhesive tape. 2. Wash the bedding in water of 130 degrees Farenheit weekly.  Avoid cotton comforters/quilts and flannel bedding: the most ideal bed covering is the dacron comforter. 3. Remove all upholstered furniture from the bedroom. 4. Remove carpets, carpet padding, rugs, and non-washable window drapes from the bedroom.  Wash drapes weekly or use plastic window coverings. 5. Remove all non-washable stuffed toys from  the bedroom.  Wash stuffed toys weekly. 6. Have the room cleaned frequently with a vacuum cleaner and a damp dust-mop.  The patient should not be in a room which is being cleaned and should wait 1 hour  after cleaning before going into the room. 7. Close and seal all heating outlets in the bedroom.  Otherwise, the room will become filled with dust-laden air.  An electric heater can be used to heat the room. 8. Reduce indoor humidity to less than 50%.  Do not use a humidifier.  Control of Dog or Cat Allergen  Avoidance is the best way to manage a dog or cat allergy. If you have a dog or cat and are allergic to dog or cats, consider removing the dog or cat from the home. If you have a dog or cat but don't want to find it a new home, or if your family wants a pet even though someone in the household is allergic, here are some strategies that may help keep symptoms at bay:  1. Keep the pet out of your bedroom and restrict it to only a few rooms. Be advised that keeping the dog or cat in only one room will not limit the allergens to that room. 2. Don't pet, hug or kiss the dog or cat; if you do, wash your hands with soap and water. 3. High-efficiency particulate air (HEPA) cleaners run continuously in a bedroom or living room can reduce allergen levels over time. 4. Regular use of a high-efficiency vacuum cleaner or a central vacuum can reduce allergen levels. 5. Giving your dog or cat a bath at least once a week can reduce airborne allergen.

## 2016-09-01 NOTE — Progress Notes (Signed)
FOLLOW UP  Date of Service/Encounter:  09/01/16   Assessment:   Moderate persistent asthma, uncomplicated  Contact dermatitis due to chemicals  Perennial allergic rhinitis  Recent beta blocker therapy (currently off Bystolic)    Asthma Reportables:  Severity: moderate persistent  Risk: medium Control: well controlled    Plan/Recommendations:   1. Likely contact dermatitis - Continue with Pazeo one drop eye daily. - Continue with Xyzal 5mg  daily to help with the itching.  - Continue with Eucrisa one application twice daily as needed for itching (safe to use on face). - We will see you next week for patch testing on July 9th.  - Testing to all of our foods was negative. - Entire food panel included: Peanut, Soy, Wheat, Corn, Milk, Egg, Casein, Shellfish Mix, Fish Mix, Cashew, Tomato, Orange, Rice, [employer], [place], [place], [place], Chicken, White Potato, Banana, [place], Peach, Sweet Potato, Green Pea, Strawberry, Onion, Cabbage, Carrots, Celery, Karaya Gum, Acacia (Arabic Gum), Cottonseed, Flounder, Trout, Shrimp, [place], [place], [place], [place], [place], [place], [place], [place], Cuc[place], [address], [place], [place], [place], [place], [place], [place], [place] nut, [place], Cinnamon, Nutmeg, Ginger, Garlic, Black Pepper, Mustard, Barley, Oat, Rye, Sesame and Pineapple  - She does have a history of adverse food reactions and I recommended that she continue to avoid all of the foods that she is currently avoiding.   2. Moderate persistent asthma, uncomplicated - Lung function looked much better today.  - We will continue with the Breo 200/25 one puff once daily. - Daily controller medication(s): Breo 200/25 one puff once daily + Singulair 10mg  daily - Rescue medications: albuterol 4 puffs every 4-6 hours as needed - Asthma control goals:  * Full participation in all desired activities (may need albuterol before activity) * Albuterol use two time or less a week on  average (not counting use with activity) * Cough interfering with sleep two time or less a month * Oral steroids no more than once a year * No hospitalizations  3. Perennial allergic rhinitis (cat, dog, dust mite) - Continue with Nasacort one spray per nostril daily to help with nasal inflammation. - Immunotherapy Consent signed. - Risks and benefits of allergen immunotherapy discussed.  - She is on Bystolic, although she is currently not taking this.   - Make an appointment in two weeks for your first injection.   4. Return in about 4 days (around 09/05/2016).   Subjective:   Alexis [name] is a 71 y.o. female presenting today for follow up of  Chief Complaint  Patient presents with  . Asthma  . Wheezing    started today  . Allergies    Alexis [name] has a history of the following: Patient Active Problem List   Diagnosis Date Noted  . History of smoking greater than 50 pack years 08/24/2016  . Moderate persistent asthma, uncomplicated 08/24/2016  . Allergic rhinitis 06/11/2014  . Asthma with acute exacerbation 06/11/2014  . Extrinsic asthma 06/11/2014  . Hyperlipemia   . HTN (hypertension)   . Anxiety   . Hypothyroidism   . GERD (gastroesophageal reflux disease)   . Vitamin D deficiency   . Peripheral vascular disease with claudication (HCC) 07/19/2012  . Carotid artery disease (HCC) 07/19/2012  . Subclavian artery stenosis, left (HCC) 07/19/2012    History obtained from: chart review and patient and her husband.  Alexis [name] was referred by Dorena Bodo, PA-C.     Alexis [name] is a 71 y.o. female presenting for a skin testing. I last saw her  last week for her first visit. At that time, she had marketed pruritic erythema around both of her eyes with some conjunctival injection. This rash was responsive to Benadryl, but Kept coming back. I felt that she likely had contact dermatitis, and she is scheduled for patch testing next Monday, July 9. We started her on Pazeo,  prednisone, Xyzal, and Eucrisa. She felt that this was all related to foods, as she has an extensive list of adverse food reactions. However, she had no new food exposures. We did obtain an environmental allergy panel that showed sensitizations to dust mites, cat, and dog. She does have cats at home, and therefore I felt that this was the likely trigger. In addition, her spirometry was not ideal and we started her on Breo 200/25 one puff once daily as well as Singulair 10 mg daily. For her rhinitis, we started her on Nasacort 1 spray per nostril daily.  Since the last visit, She reports that she has done very well. She feels that the topical Eucrisa helped with her rash more than anything else. She has not taken the Xyzal since she is having testing today. She did start the prednisone pack as well as the eyedrops. She is overall feeling much better. She continues to think that this is related to foods, and blood testing done today. With regards to her asthma, she feels that the Virgel Bouquet is working quite well. She feels much better on this medication.  Otherwise, there have been no changes to her past medical history, surgical history, family history, or social history.    Review of Systems: a 14-point review of systems is pertinent for what is mentioned in HPI.  Otherwise, all other systems were negative. Constitutional: negative other than that listed in the HPI Eyes: negative other than that listed in the HPI Ears, nose, mouth, throat, and face: negative other than that listed in the HPI Respiratory: negative other than that listed in the HPI Cardiovascular: negative other than that listed in the HPI Gastrointestinal: negative other than that listed in the HPI Genitourinary: negative other than that listed in the HPI Integument: negative other than that listed in the HPI Hematologic: negative other than that listed in the HPI Musculoskeletal: negative other than that listed in the HPI Neurological:  negative other than that listed in the HPI Allergy/Immunologic: negative other than that listed in the HPI    Objective:   Blood pressure 112/60, pulse 86, temperature 98.4 F (36.9 C), temperature source Oral, resp. rate 18, SpO2 97 %. There is no height or weight on file to calculate BMI.   Physical Exam:  General: Alert, interactive, in no acute distress. Pleasant and appreciative.  Eyes: Some residual erythema and swelliung around both eyes, No conjunctival injection present on the right, No conjunctival injection present on the left, PERRL bilaterally, No discharge on the right, No discharge on the left and No Horner-Trantas dots present Ears: Right TM pearly gray with normal light reflex, Left TM pearly gray with normal light reflex, Right TM intact without perforation and Left TM intact without perforation.  Nose/Throat: External nose within normal limits, nasal crease present and septum midline, turbinates edematous and pale without discharge, post-pharynx mildly erythematous without cobblestoning in the posterior oropharynx. Tonsils 2+ without exudates Neck: Supple without thyromegaly. Lungs: Clear to auscultation without wheezing, rhonchi or rales. No increased work of breathing. CV: Normal S1/S2, no murmurs. Capillary refill <2 seconds.  Skin: Warm and dry, without lesions or rashes. Neuro:  Grossly intact. No focal deficits appreciated. Responsive to questions.   Diagnostic studies:   Initial spirometry today noted an FEV1 of 1.23 (69%) and an FVC of 1.83 (81%). The FEV1 to FVC ratio was 67. We did do a DuoNeb treatment today with an improvement of 19% in her FVC. This was significant per ATS criteria. Overall, her values are markedly improved compared to her first visit.  Allergy Studies:   Full Food Panel: negative to the entire panel with adequate controls. Negative to Peanut, Soy, Wheat, Corn, Milk, Egg, Casein, Shellfish Mix, Fish Mix, Cashew, Tomato, Orange, Rice,  [employer]iant EnergyPork, [place]urkey, [place]ef, [place], Chicken, [place] Potato, Banana, [place], Peach, Sweet Potato, Green Pea, Strawberry, Onion, Cabbage, Carrots, Celery, Karaya Gum, Acacia (Arabic Gum), Cottonseed, Flounder, Trout, Shrimp, [place], [place], [place]al[place], [place]erm[place], [place]ec[place], [place], [place], Wheeler AFBGrape, Cucumber, [address]Saccharomycese Cerevisiae, [place]yster, [place]ob[place], [place], [place], [place], [place]el[place], [place] nut, [place]ut, [place], [place]utmeg, Ginger, Garlic, Black Pepper, Mustard, Barley, Oat, Rye, Sesame and Pineapple       Malachi BondsJoel Logann Whitebread, MD FAAAAI [facility] [place]

## 2016-09-01 NOTE — Telephone Encounter (Signed)
Per Dr. Justus MemoryGallgher, sent Elidel to replace Eucrisa due to insurance coverage.

## 2016-09-02 DIAGNOSIS — J3089 Other allergic rhinitis: Secondary | ICD-10-CM | POA: Diagnosis not present

## 2016-09-05 ENCOUNTER — Encounter: Payer: Self-pay | Admitting: Allergy & Immunology

## 2016-09-05 ENCOUNTER — Ambulatory Visit (INDEPENDENT_AMBULATORY_CARE_PROVIDER_SITE_OTHER): Payer: Medicare Other | Admitting: Allergy & Immunology

## 2016-09-05 VITALS — BP 98/58 | HR 87 | Temp 98.4°F | Resp 14

## 2016-09-05 DIAGNOSIS — L253 Unspecified contact dermatitis due to other chemical products: Secondary | ICD-10-CM

## 2016-09-05 MED ORDER — OLOPATADINE HCL 0.1 % OP SOLN: 1.0000 [drp] | Freq: Two times a day (BID) | OPHTHALMIC | 5 refills | Status: DC

## 2016-09-05 NOTE — Progress Notes (Signed)
FOLLOW UP  Date of Service/Encounter:  09/05/16   Assessment:   Moderate persistent asthma, uncomplicated  Contact dermatitis due to cosmetics - not controlled because patient insists on wearing mascara  Perennial allergic rhinitis (dog, cat, dust mite) - starting immunotherapy   Recent beta blocker therapy (currently off Bystolic)    Plan/Recommendations:   1. Contact dermatitis - Patches were placed today. - No showering until the patches are removed on Wednesday. - Come back on Wednesday and Friday for reads.  - You can continue your antihistamine while the patches are on. - Do not put any topical steroids on it.   2. Return in about 2 days (around 09/07/2016).   Subjective:   Alexis [name] is a 71 y.o. female presenting today for follow up of  Chief Complaint  Patient presents with  . Allergies    patch testing    Alexis [name] has a history of the following: Patient Active Problem List   Diagnosis Date Noted  . History of smoking greater than 50 pack years 08/24/2016  . Moderate persistent asthma, uncomplicated 08/24/2016  . Allergic rhinitis 06/11/2014  . Asthma with acute exacerbation 06/11/2014  . Extrinsic asthma 06/11/2014  . Hyperlipemia   . HTN (hypertension)   . Anxiety   . Hypothyroidism   . GERD (gastroesophageal reflux disease)   . Vitamin D deficiency   . Peripheral vascular disease with claudication (HCC) 07/19/2012  . Carotid artery disease (HCC) 07/19/2012  . Subclavian artery stenosis, left (HCC) 07/19/2012    History obtained from: chart review and the patient and her husband.  Alexis Alexis [name] was referred by Dorena Bodoixon, Mary B, PA-C.     Alexis [name] is a 71 y.o. female presenting for a follow up visit. I last saw her last week for her first visit. At that time, she had marketed pruritic erythema around both of her eyes with some conjunctival injection. This rash was responsive to Benadryl, but Kept coming back. I felt that she likely  had contact dermatitis, and she is scheduled for patch testing next Monday, July 9. We started her on Pazeo, prednisone, Xyzal, and Eucrisa. She felt that this was all related to foods, as she has an extensive list of adverse food reactions. However, she had no new food exposures. We did obtain an environmental allergy panel that showed sensitizations to dust mites, cat, and dog. She does have cats at home, and therefore I felt that this was the likely trigger. In addition, her spirometry was not ideal and we started her on Breo 200/25 one puff once daily as well as Singulair 10 mg daily. For her rhinitis, we started her on Nasacort 1 spray per nostril daily.  Since the last visit, she has done well. She did run out of her eye drops which had been working quite well. She remains on her Xyzla 5mg  daily. She continues to use the [place]ris[place] on her eyelids. Unfortunately, she decided to use mascara this morning and her eyes flared again. Despite my recommending that she stop using makeup, she decided to keep using it because "[she] is vain", which she readily admits. Asthma is under good control with the Breo, which she continues to rave about.   Otherwise, there have been no changes to her past medical history, surgical history, family history, or social history.    Review of Systems: a 14-point review of systems is pertinent for what is mentioned in HPI.  Otherwise, all other systems were negative. Constitutional: negative other than that  listed in the HPI Eyes: negative other than that listed in the HPI Ears, nose, mouth, throat, and face: negative other than that listed in the HPI Respiratory: negative other than that listed in the HPI Cardiovascular: negative other than that listed in the HPI Gastrointestinal: negative other than that listed in the HPI Genitourinary: negative other than that listed in the HPI Integument: negative other than that listed in the HPI Hematologic: negative other than that  listed in the HPI Musculoskeletal: negative other than that listed in the HPI Neurological: negative other than that listed in the HPI Allergy/Immunologic: negative other than that listed in the HPI    Objective:   Blood pressure (!) 98/58, pulse 87, temperature 98.4 F (36.9 C), temperature source Oral, resp. rate 14, SpO2 96 %. There is no height or weight on file to calculate BMI.   Physical Exam:  General: Alert, interactive, in no acute distress. Very pleasant female. Showing me pictures of her daughter and grand-daughter.  Eyes: Erythematous periorbital rash bilaterally, Conjunctival injection on the right with limbal sparing, Conjunctival injection on the left with limbal sparing, PERRL bilaterally, No discharge on the right, No discharge on the left, No Horner-Trantas dots present and allergic shiners present bilaterally Ears: Right TM pearly gray with normal light reflex, Left TM pearly gray with normal light reflex, Right TM intact without perforation and Left TM intact without perforation.  Nose/Throat: External nose within normal limits and septum midline, turbinates markedly edematous and pale with clear discharge, post-pharynx mildly erythematous without cobblestoning in the posterior oropharynx. Tonsils 2+ without exudates Neck: Supple without thyromegaly. Lungs: Clear to auscultation without wheezing, rhonchi or rales. No increased work of breathing. CV: Normal S1/S2, no murmurs. Capillary refill <2 seconds.  Skin: Warm and dry, without lesions or rashes. Neuro:   Grossly intact. No focal deficits appreciated. Responsive to questions.   Diagnostic studies: True Test patches placed on back    Malachi Bonds, MD [facility] [place]

## 2016-09-05 NOTE — Patient Instructions (Addendum)
1. Contact dermatitis - Patches were placed today. - No showering until the patches are removed on Wednesday. - Come back on Wednesday and Friday for reads.  - You can continue your antihistamine while the patches are on. - Do not put any topical steroids on it.   2. Return in about 2 days (around 09/07/2016).   Please inform us of any Emergency Department visits, hospitalizations, or changes in symptoms. Call us before going to the ED for breathing or allergy symptoms since we might be able to fit you in for a sick visit. Feel free to contact us anytime with any questions, problems, or concerns.  It was a pleasure to see you and your family again today!   Websites that have reliable patient information: 1. American Academy of Asthma, Allergy, and Immunology: www.aaaai.org 2. Food Allergy Research and Education (FARE): foodallergy.org 3. Mothers of Asthmatics: http://www.asthmacommunitynetwork.org 4. American College of Allergy, Asthma, and Immunology: www.acaai.org

## 2016-09-07 ENCOUNTER — Encounter: Payer: Self-pay | Admitting: Allergy and Immunology

## 2016-09-07 ENCOUNTER — Ambulatory Visit: Payer: Medicare Other | Admitting: Allergy and Immunology

## 2016-09-07 VITALS — BP 118/60 | HR 89 | Temp 97.6°F | Resp 19

## 2016-09-07 DIAGNOSIS — L253 Unspecified contact dermatitis due to other chemical products: Secondary | ICD-10-CM

## 2016-09-09 ENCOUNTER — Ambulatory Visit: Payer: Medicare Other | Admitting: Allergy and Immunology

## 2016-09-09 DIAGNOSIS — L309 Dermatitis, unspecified: Secondary | ICD-10-CM | POA: Insufficient documentation

## 2016-09-09 NOTE — Assessment & Plan Note (Signed)
   Unclear etiology, possibly atopic secondary to cat exposure.  Follow up with Dr. Dellis AnesGallagher.

## 2016-09-09 NOTE — Progress Notes (Signed)
    Follow-up Note  RE: Alexis [name] MRN: [id number] DOB: [birth year] Date of Office Visit: 09/09/2016  Primary care provider: Deon Pillingixon, Mary B, PA-C Referring provider: Deon Pillingixon, Mary B, PA-C   Alexis Hashimotoatricia returns to the office today for the final patch test interpretation, given suspected history of contact dermatitis.    Diagnostics:  TRUE TEST final reading: All negative.  Assessment and Plan:   Dermatitis  Unclear etiology, possibly atopic secondary to cat exposure.  Follow up with Dr. Dellis AnesGallagher.

## 2016-09-09 NOTE — Patient Instructions (Signed)
Dermatitis  Unclear etiology, possibly atopic secondary to cat exposure.  Follow up with Dr. Dellis AnesGallagher.   Return if symptoms worsen or fail to improve.

## 2016-09-12 NOTE — Progress Notes (Signed)
Alexis Hashimotoatricia returns to this clinic to have a 48 hour true test patch test read. She did not demonstrate any hypersensitivity against any of the allergens noted on the true test patch test at 48 hours.

## 2016-09-13 ENCOUNTER — Telehealth: Payer: Self-pay

## 2016-09-13 NOTE — Telephone Encounter (Signed)
Shouldn't be on benazepril and valsartan???? Is she on both?

## 2016-09-13 NOTE — Telephone Encounter (Signed)
Patient has called regarding her medication Valsartan. The FDA has voluntarily recalled the medication  due to the possibility that it can lead to cancer.  Patient is requesting to have her medication changed. Pls advise

## 2016-09-13 NOTE — Telephone Encounter (Signed)
I spoke with patient and she confirmed she is taking both B/P medicines. I informed patient to stop valsartan and continue the benazepril and to monitor her b/p and call our office with any high readings  patient states she has a b/p cuff at home.

## 2016-09-14 ENCOUNTER — Ambulatory Visit (INDEPENDENT_AMBULATORY_CARE_PROVIDER_SITE_OTHER): Payer: Medicare Other | Admitting: Allergy & Immunology

## 2016-09-14 DIAGNOSIS — J4540 Moderate persistent asthma, uncomplicated: Secondary | ICD-10-CM

## 2016-09-14 DIAGNOSIS — L309 Dermatitis, unspecified: Secondary | ICD-10-CM | POA: Diagnosis not present

## 2016-09-14 DIAGNOSIS — J3089 Other allergic rhinitis: Secondary | ICD-10-CM | POA: Diagnosis not present

## 2016-09-14 DIAGNOSIS — J309 Allergic rhinitis, unspecified: Secondary | ICD-10-CM

## 2016-09-14 MED ORDER — TACROLIMUS 0.1 % EX OINT: TOPICAL_OINTMENT | Freq: Two times a day (BID) | CUTANEOUS | 0 refills | Status: DC

## 2016-09-14 NOTE — Patient Instructions (Addendum)
1. Contact dermatitis - with negative testing - Continue with Pazeo one drop eye daily. - Continue with Xyzal 5mg  daily to help with the itching.  - Continue with Eucrisa one application twice daily as needed for itching (safe to use on face). - We will send in Protopic instead (also a non-steroidal) to use in place of Eucrisa to see if this is cheaper.   2. Moderate persistent asthma, uncomplicated - We will not make any changes today.  - Daily controller medication(s): Breo 200/25 one puff once daily + Singulair 10mg  daily - Rescue medications: albuterol 4 puffs every 4-6 hours as needed - Asthma control goals:  * Full participation in all desired activities (may need albuterol before activity) * Albuterol use two time or less a week on average (not counting use with activity) * Cough interfering with sleep two time or less a month * Oral steroids no more than once a year * No hospitalizations  3. Perennial allergic rhinitis (cat, dog, dust mite) - Allergy shot given today. - Continue with your antihistamines as recommended.    4. Return in about 6 months (around 03/17/2017).   Please inform us of any Emergency Department visits, hospitalizations, or changes in symptoms. Call us before going to the ED for breathing or allergy symptoms since we might be able to fit you in for a sick visit. Feel free to contact us anytime with any questions, problems, or concerns.  It was a pleasure to see you again today!   Websites that have reliable patient information: 1. American Academy of Asthma, Allergy, and Immunology: www.aaaai.org 2. Food Allergy Research and Education (FARE): foodallergy.org 3. Mothers of Asthmatics: http://www.asthmacommunitynetwork.org 4. American College of Allergy, Asthma, and Immunology: www.acaai.org   Control of House Dust Mite Allergen    House dust mites play a major role in allergic asthma and rhinitis.  They occur in environments with high humidity  wherever human skin, the food for dust mites is found. High levels have been detected in dust obtained from mattresses, pillows, carpets, upholstered furniture, bed covers, clothes and soft toys.  The principal allergen of the house dust mite is found in its feces.  A gram of dust may contain 1,000 mites and 250,000 fecal particles.  Mite antigen is easily measured in the air during house cleaning activities.    1. Encase mattresses, including the box spring, and pillow, in an air tight cover.  Seal the zipper end of the encased mattresses with wide adhesive tape. 2. Wash the bedding in water of 130 degrees Farenheit weekly.  Avoid cotton comforters/quilts and flannel bedding: the most ideal bed covering is the dacron comforter. 3. Remove all upholstered furniture from the bedroom. 4. Remove carpets, carpet padding, rugs, and non-washable window drapes from the bedroom.  Wash drapes weekly or use plastic window coverings. 5. Remove all non-washable stuffed toys from the bedroom.  Wash stuffed toys weekly. 6. Have the room cleaned frequently with a vacuum cleaner and a damp dust-mop.  The patient should not be in a room which is being cleaned and should wait 1 hour after cleaning before going into the room. 7. Close and seal all heating outlets in the bedroom.  Otherwise, the room will become filled with dust-laden air.  An electric heater can be used to heat the room. 8. Reduce indoor humidity to less than 50%.  Do not use a humidifier.  Control of Dog or Cat Allergen  Avoidance is the best way to manage a dog  or cat allergy. If you have a dog or cat and are allergic to dog or cats, consider removing the dog or cat from the home. If you have a dog or cat but don't want to find it a new home, or if your family wants a pet even though someone in the household is allergic, here are some strategies that may help keep symptoms at bay:  1. Keep the pet out of your bedroom and restrict it to only a few  rooms. Be advised that keeping the dog or cat in only one room will not limit the allergens to that room. 2. Don't pet, hug or kiss the dog or cat; if you do, wash your hands with soap and water. 3. High-efficiency particulate air (HEPA) cleaners run continuously in a bedroom or living room can reduce allergen levels over time. 4. Regular use of a high-efficiency vacuum cleaner or a central vacuum can reduce allergen levels. 5. Giving your dog or cat a bath at least once a week can reduce airborne allergen.

## 2016-09-14 NOTE — Progress Notes (Signed)
Immunotherapy   Patient Details  Name: Rocco Paulsatricia Sprung MRN: [id number] Date of Birth: 10-Sep-1945  09/14/2016  Elease HashimotoPatricia Armes started injections for  Cat-Dog-Mite. Following schedule: B  Frequency:2 times per week Epi-Pen:Epi-Pen Available  Consent signed and patient instructions given. No problems after 30 minutes in the office.   Vella RedheadHeather Clark 09/14/2016, 10:10 AM

## 2016-09-14 NOTE — Progress Notes (Signed)
FOLLOW UP  Date of Service/Encounter:  09/14/16   Assessment:   Dermatitis  Perennial allergic rhinitis (cat, dog, dust mite)  Moderate persistent asthma, uncomplicated   Asthma Reportables:  Severity: moderate persistent  Risk: low Control: well controlled   Plan/Recommendations:   1. Contact dermatitis - with negative patch testing - Continue with Pazeo one drop eye daily. - Continue with Xyzal 5mg  daily to help with the itching.  - Continue with Eucrisa one application twice daily as needed for itching (safe to use on face). - We will send in Protopic instead (also a non-steroidal) to use in place of Eucrisa to see if this is cheaper.  - With the worsening flares with MIC of administration, I still think there might be a component of contact dermatitis to this. - However, she would need a more thorough patch workup at an academic institution. - This could also be a result of the makeup enhancing the antigen presentation of cat dander to her immune system by essentially blocking antigen against her skin when she applies the makeup. -In any case, her symptoms are fairly well controlled with the eyedrops and the Xyzal as well as the topical anti-inflammatory medication. - We will see how her symptoms change once her allergen immunotherapy is on board.   2. Moderate persistent asthma, uncomplicated - We will not make any changes today.  - Daily controller medication(s): Breo 200/25 one puff once daily + Singulair 10mg  daily - Rescue medications: albuterol 4 puffs every 4-6 hours as needed - Asthma control goals:  * Full participation in all desired activities (may need albuterol before activity) * Albuterol use two time or less a week on average (not counting use with activity) * Cough interfering with sleep two time or less a month * Oral steroids no more than once a year * No hospitalizations  3. Perennial allergic rhinitis (cat, dog, dust mite) - Allergy shot given  today. - Continue with your antihistamines as recommended.    4. Return in about 6 months (around 03/17/2017).  Subjective:   Alexis [name] is a 71 y.o. female presenting today for follow up of No chief complaint on file.   Alexis [name] has a history of the following: Patient Active Problem List   Diagnosis Date Noted  . Dermatitis 09/09/2016  . History of smoking greater than 50 pack years 08/24/2016  . Moderate persistent asthma, uncomplicated 08/24/2016  . Allergic rhinitis 06/11/2014  . Asthma with acute exacerbation 06/11/2014  . Extrinsic asthma 06/11/2014  . Hyperlipemia   . HTN (hypertension)   . Anxiety   . Hypothyroidism   . GERD (gastroesophageal reflux disease)   . Vitamin D deficiency   . Peripheral vascular disease with claudication (HCC) 07/19/2012  . Carotid artery disease (HCC) 07/19/2012  . Subclavian artery stenosis, left (HCC) 07/19/2012    History obtained from: chart review and patient.  Alexis [name] was referred by Dorena Bodoixon, Mary B, PA-C.     Alexis Hashimotoatricia is a 71 y.o. female presenting for a follow up visit. She has a history of marked pruritic erythema around both of her eyes with conjunctival injection. I first saw her at the end of June. We started her on has he had a 1 drop per eye daily as well as Xyzal 5 mg daily and decrease ointments as needed twice daily. This seems to have improved her symptoms. An environmental allergy panel showed sensitizations to dogs, cats, and dust mite. We felt that her cats were the most  likely relevant allergen since she has several at home. She decided to initiate allergen immunotherapy. Because of the timing with makeup administration, we also pursued patch testing which unfortunately was negative to the entire True Test panel.  Since the last visit, She reports that things are going well. She remains on her eyedrop as well as Xyzal and Eucrisa which are keeping her eye symptoms at bay. She is rather surprised that the  negative testing, but does note that she put on makeup today and had no worsening of her symptoms. She has not instituted any Avoidance measures as she is afraid that her cats would be quite angry at her. She is excited about starting allergy shots. She did take her Xyzal this morning.  Otherwise, there have been no changes to her past medical history, surgical history, family history, or social history.    Review of Systems: a 14-point review of systems is pertinent for what is mentioned in HPI.  Otherwise, all other systems were negative. Constitutional: negative other than that listed in the HPI Eyes: negative other than that listed in the HPI Ears, nose, mouth, throat, and face: negative other than that listed in the HPI Respiratory: negative other than that listed in the HPI Cardiovascular: negative other than that listed in the HPI Gastrointestinal: negative other than that listed in the HPI Genitourinary: negative other than that listed in the HPI Integument: negative other than that listed in the HPI Hematologic: negative other than that listed in the HPI Musculoskeletal: negative other than that listed in the HPI Neurological: negative other than that listed in the HPI Allergy/Immunologic: negative other than that listed in the HPI    Objective:   There were no vitals taken for this visit. There is no height or weight on file to calculate BMI.   Physical Exam:  General: Alert, interactive, in no acute distress. Pleasant, well-coiffed female.  Eyes: Conjunctival injection on the right with limbal sparing, Conjunctival injection on the left with limbal sparing, PERRL bilaterally, No discharge on the right, No discharge on the left and No Horner-Trantas dots present. Overall her eyes appear much less injected than at previous visits.  Ears: Right TM pearly gray with normal light reflex, Left TM pearly gray with normal light reflex, Right TM intact without perforation and Left TM  intact without perforation.  Nose/Throat: External nose within normal limits and septum midline, turbinates edematous and pale with clear discharge, post-pharynx mildly erythematous without cobblestoning in the posterior oropharynx. Tonsils 2+ without exudates Neck: Supple without thyromegaly. Lungs: Clear to auscultation without wheezing, rhonchi or rales. No increased work of breathing. CV: Normal S1/S2, no murmurs. Capillary refill <2 seconds.  Skin: Warm and dry, without lesions or rashes. Neuro:   Grossly intact. No focal deficits appreciated. Responsive to questions.   Diagnostic studies: I did evaluate her patch testing sites to see if there were any late reactions, however her entire back was clear.     Malachi Bonds, MD FAAAAI [facility]

## 2016-09-15 ENCOUNTER — Other Ambulatory Visit: Payer: Self-pay | Admitting: Physician Assistant

## 2016-09-15 ENCOUNTER — Ambulatory Visit: Payer: Medicare Other

## 2016-09-16 ENCOUNTER — Ambulatory Visit (INDEPENDENT_AMBULATORY_CARE_PROVIDER_SITE_OTHER): Payer: Medicare Other

## 2016-09-16 DIAGNOSIS — J309 Allergic rhinitis, unspecified: Secondary | ICD-10-CM | POA: Diagnosis not present

## 2016-09-19 ENCOUNTER — Ambulatory Visit (INDEPENDENT_AMBULATORY_CARE_PROVIDER_SITE_OTHER): Payer: Medicare Other

## 2016-09-19 DIAGNOSIS — J309 Allergic rhinitis, unspecified: Secondary | ICD-10-CM

## 2016-09-23 ENCOUNTER — Ambulatory Visit (INDEPENDENT_AMBULATORY_CARE_PROVIDER_SITE_OTHER): Payer: Medicare Other

## 2016-09-23 DIAGNOSIS — J309 Allergic rhinitis, unspecified: Secondary | ICD-10-CM | POA: Diagnosis not present

## 2016-09-26 ENCOUNTER — Ambulatory Visit (INDEPENDENT_AMBULATORY_CARE_PROVIDER_SITE_OTHER): Payer: Medicare Other

## 2016-09-26 DIAGNOSIS — J309 Allergic rhinitis, unspecified: Secondary | ICD-10-CM | POA: Diagnosis not present

## 2016-09-30 ENCOUNTER — Ambulatory Visit (INDEPENDENT_AMBULATORY_CARE_PROVIDER_SITE_OTHER): Payer: Medicare Other | Admitting: *Deleted

## 2016-09-30 DIAGNOSIS — J309 Allergic rhinitis, unspecified: Secondary | ICD-10-CM | POA: Diagnosis not present

## 2016-10-04 ENCOUNTER — Ambulatory Visit (INDEPENDENT_AMBULATORY_CARE_PROVIDER_SITE_OTHER): Payer: Medicare Other

## 2016-10-04 DIAGNOSIS — J309 Allergic rhinitis, unspecified: Secondary | ICD-10-CM | POA: Diagnosis not present

## 2016-10-11 ENCOUNTER — Ambulatory Visit (INDEPENDENT_AMBULATORY_CARE_PROVIDER_SITE_OTHER): Payer: Medicare Other | Admitting: *Deleted

## 2016-10-11 DIAGNOSIS — J309 Allergic rhinitis, unspecified: Secondary | ICD-10-CM | POA: Diagnosis not present

## 2016-10-14 ENCOUNTER — Ambulatory Visit (INDEPENDENT_AMBULATORY_CARE_PROVIDER_SITE_OTHER): Payer: Medicare Other

## 2016-10-14 DIAGNOSIS — J309 Allergic rhinitis, unspecified: Secondary | ICD-10-CM | POA: Diagnosis not present

## 2016-10-19 ENCOUNTER — Ambulatory Visit (INDEPENDENT_AMBULATORY_CARE_PROVIDER_SITE_OTHER): Payer: Medicare Other | Admitting: *Deleted

## 2016-10-19 DIAGNOSIS — J309 Allergic rhinitis, unspecified: Secondary | ICD-10-CM | POA: Diagnosis not present

## 2016-10-21 ENCOUNTER — Ambulatory Visit (INDEPENDENT_AMBULATORY_CARE_PROVIDER_SITE_OTHER): Payer: Medicare Other

## 2016-10-21 DIAGNOSIS — J309 Allergic rhinitis, unspecified: Secondary | ICD-10-CM

## 2016-10-25 ENCOUNTER — Ambulatory Visit (INDEPENDENT_AMBULATORY_CARE_PROVIDER_SITE_OTHER): Payer: Medicare Other

## 2016-10-25 DIAGNOSIS — J309 Allergic rhinitis, unspecified: Secondary | ICD-10-CM | POA: Diagnosis not present

## 2016-10-28 ENCOUNTER — Ambulatory Visit (INDEPENDENT_AMBULATORY_CARE_PROVIDER_SITE_OTHER): Payer: Medicare Other

## 2016-10-28 DIAGNOSIS — J309 Allergic rhinitis, unspecified: Secondary | ICD-10-CM | POA: Diagnosis not present

## 2016-11-02 ENCOUNTER — Ambulatory Visit (INDEPENDENT_AMBULATORY_CARE_PROVIDER_SITE_OTHER): Payer: Medicare Other | Admitting: *Deleted

## 2016-11-02 DIAGNOSIS — J309 Allergic rhinitis, unspecified: Secondary | ICD-10-CM | POA: Diagnosis not present

## 2016-11-04 ENCOUNTER — Ambulatory Visit (INDEPENDENT_AMBULATORY_CARE_PROVIDER_SITE_OTHER): Payer: Medicare Other

## 2016-11-04 DIAGNOSIS — J309 Allergic rhinitis, unspecified: Secondary | ICD-10-CM | POA: Diagnosis not present

## 2016-11-08 ENCOUNTER — Ambulatory Visit (INDEPENDENT_AMBULATORY_CARE_PROVIDER_SITE_OTHER): Payer: Medicare Other | Admitting: *Deleted

## 2016-11-08 DIAGNOSIS — J309 Allergic rhinitis, unspecified: Secondary | ICD-10-CM

## 2016-11-10 ENCOUNTER — Ambulatory Visit (INDEPENDENT_AMBULATORY_CARE_PROVIDER_SITE_OTHER): Payer: Medicare Other | Admitting: *Deleted

## 2016-11-10 DIAGNOSIS — J309 Allergic rhinitis, unspecified: Secondary | ICD-10-CM | POA: Diagnosis not present

## 2016-11-14 ENCOUNTER — Other Ambulatory Visit: Payer: Self-pay | Admitting: Physician Assistant

## 2016-11-16 ENCOUNTER — Ambulatory Visit (INDEPENDENT_AMBULATORY_CARE_PROVIDER_SITE_OTHER): Payer: Medicare Other

## 2016-11-16 DIAGNOSIS — J309 Allergic rhinitis, unspecified: Secondary | ICD-10-CM | POA: Diagnosis not present

## 2016-11-18 ENCOUNTER — Ambulatory Visit (INDEPENDENT_AMBULATORY_CARE_PROVIDER_SITE_OTHER): Payer: Medicare Other

## 2016-11-18 DIAGNOSIS — J309 Allergic rhinitis, unspecified: Secondary | ICD-10-CM | POA: Diagnosis not present

## 2016-11-22 ENCOUNTER — Ambulatory Visit (INDEPENDENT_AMBULATORY_CARE_PROVIDER_SITE_OTHER): Payer: Medicare Other | Admitting: *Deleted

## 2016-11-22 DIAGNOSIS — J309 Allergic rhinitis, unspecified: Secondary | ICD-10-CM | POA: Diagnosis not present

## 2016-11-30 ENCOUNTER — Other Ambulatory Visit: Payer: Self-pay | Admitting: Physician Assistant

## 2016-12-01 ENCOUNTER — Ambulatory Visit (INDEPENDENT_AMBULATORY_CARE_PROVIDER_SITE_OTHER): Payer: Medicare Other

## 2016-12-01 DIAGNOSIS — J309 Allergic rhinitis, unspecified: Secondary | ICD-10-CM | POA: Diagnosis not present

## 2016-12-14 ENCOUNTER — Ambulatory Visit (INDEPENDENT_AMBULATORY_CARE_PROVIDER_SITE_OTHER): Payer: Medicare Other | Admitting: *Deleted

## 2016-12-14 DIAGNOSIS — J309 Allergic rhinitis, unspecified: Secondary | ICD-10-CM

## 2016-12-19 ENCOUNTER — Other Ambulatory Visit: Payer: Self-pay | Admitting: *Deleted

## 2016-12-19 DIAGNOSIS — I739 Peripheral vascular disease, unspecified: Principal | ICD-10-CM

## 2016-12-19 DIAGNOSIS — I779 Disorder of arteries and arterioles, unspecified: Secondary | ICD-10-CM

## 2016-12-21 ENCOUNTER — Ambulatory Visit (INDEPENDENT_AMBULATORY_CARE_PROVIDER_SITE_OTHER): Payer: Medicare Other | Admitting: *Deleted

## 2016-12-21 DIAGNOSIS — J309 Allergic rhinitis, unspecified: Secondary | ICD-10-CM

## 2016-12-29 ENCOUNTER — Ambulatory Visit (INDEPENDENT_AMBULATORY_CARE_PROVIDER_SITE_OTHER): Payer: Medicare Other

## 2016-12-29 DIAGNOSIS — J309 Allergic rhinitis, unspecified: Secondary | ICD-10-CM

## 2017-01-04 ENCOUNTER — Ambulatory Visit (INDEPENDENT_AMBULATORY_CARE_PROVIDER_SITE_OTHER): Payer: Medicare Other

## 2017-01-04 DIAGNOSIS — J309 Allergic rhinitis, unspecified: Secondary | ICD-10-CM

## 2017-01-09 ENCOUNTER — Ambulatory Visit (HOSPITAL_COMMUNITY)
Admission: RE | Admit: 2017-01-09 | Discharge: 2017-01-09 | Disposition: A | Discharge status: Home / Self Care | Payer: Medicare Other | Source: Ambulatory Visit | Attending: Cardiology | Admitting: Cardiology

## 2017-01-09 DIAGNOSIS — I6523 Occlusion and stenosis of bilateral carotid arteries: Secondary | ICD-10-CM | POA: Insufficient documentation

## 2017-01-09 DIAGNOSIS — I779 Disorder of arteries and arterioles, unspecified: Secondary | ICD-10-CM

## 2017-01-09 DIAGNOSIS — I739 Peripheral vascular disease, unspecified: Secondary | ICD-10-CM

## 2017-01-13 ENCOUNTER — Ambulatory Visit (INDEPENDENT_AMBULATORY_CARE_PROVIDER_SITE_OTHER): Payer: Medicare Other

## 2017-01-13 DIAGNOSIS — J309 Allergic rhinitis, unspecified: Secondary | ICD-10-CM

## 2017-01-23 ENCOUNTER — Other Ambulatory Visit: Payer: Self-pay | Admitting: Physician Assistant

## 2017-01-23 NOTE — Telephone Encounter (Signed)
Patient is due for an office visit before anymore refills.letter mailed

## 2017-01-24 ENCOUNTER — Ambulatory Visit (INDEPENDENT_AMBULATORY_CARE_PROVIDER_SITE_OTHER): Payer: Medicare Other | Admitting: *Deleted

## 2017-01-24 DIAGNOSIS — J309 Allergic rhinitis, unspecified: Secondary | ICD-10-CM

## 2017-01-26 ENCOUNTER — Encounter: Payer: Self-pay | Admitting: *Deleted

## 2017-01-26 DIAGNOSIS — J3089 Other allergic rhinitis: Secondary | ICD-10-CM | POA: Diagnosis not present

## 2017-01-26 NOTE — Progress Notes (Signed)
MT VIAL MADE 

## 2017-02-02 ENCOUNTER — Ambulatory Visit (INDEPENDENT_AMBULATORY_CARE_PROVIDER_SITE_OTHER): Payer: Medicare Other | Admitting: *Deleted

## 2017-02-02 DIAGNOSIS — J309 Allergic rhinitis, unspecified: Secondary | ICD-10-CM | POA: Diagnosis not present

## 2017-02-13 ENCOUNTER — Ambulatory Visit (INDEPENDENT_AMBULATORY_CARE_PROVIDER_SITE_OTHER): Payer: Medicare Other | Admitting: Allergy & Immunology

## 2017-02-13 DIAGNOSIS — J309 Allergic rhinitis, unspecified: Secondary | ICD-10-CM | POA: Diagnosis not present

## 2017-02-15 ENCOUNTER — Encounter: Payer: Self-pay | Admitting: *Deleted

## 2017-02-23 ENCOUNTER — Ambulatory Visit (INDEPENDENT_AMBULATORY_CARE_PROVIDER_SITE_OTHER): Payer: Medicare Other | Admitting: *Deleted

## 2017-02-23 DIAGNOSIS — J309 Allergic rhinitis, unspecified: Secondary | ICD-10-CM

## 2017-03-01 ENCOUNTER — Other Ambulatory Visit: Payer: Self-pay | Admitting: Physician Assistant

## 2017-03-01 ENCOUNTER — Ambulatory Visit (INDEPENDENT_AMBULATORY_CARE_PROVIDER_SITE_OTHER): Payer: Medicare Other | Admitting: *Deleted

## 2017-03-01 DIAGNOSIS — J309 Allergic rhinitis, unspecified: Secondary | ICD-10-CM

## 2017-03-06 ENCOUNTER — Ambulatory Visit (INDEPENDENT_AMBULATORY_CARE_PROVIDER_SITE_OTHER): Payer: Medicare Other | Admitting: Physician Assistant

## 2017-03-06 ENCOUNTER — Other Ambulatory Visit: Payer: Self-pay

## 2017-03-06 ENCOUNTER — Encounter: Payer: Self-pay | Admitting: Physician Assistant

## 2017-03-06 ENCOUNTER — Ambulatory Visit (INDEPENDENT_AMBULATORY_CARE_PROVIDER_SITE_OTHER): Payer: Medicare Other | Admitting: *Deleted

## 2017-03-06 VITALS — BP 118/70 | HR 55 | Temp 97.8°F | Resp 14 | Wt 109.0 lb

## 2017-03-06 DIAGNOSIS — I739 Peripheral vascular disease, unspecified: Secondary | ICD-10-CM | POA: Diagnosis not present

## 2017-03-06 DIAGNOSIS — I771 Stricture of artery: Secondary | ICD-10-CM

## 2017-03-06 DIAGNOSIS — J3081 Allergic rhinitis due to animal (cat) (dog) hair and dander: Secondary | ICD-10-CM

## 2017-03-06 DIAGNOSIS — F419 Anxiety disorder, unspecified: Secondary | ICD-10-CM | POA: Diagnosis not present

## 2017-03-06 DIAGNOSIS — E785 Hyperlipidemia, unspecified: Secondary | ICD-10-CM | POA: Diagnosis not present

## 2017-03-06 DIAGNOSIS — E039 Hypothyroidism, unspecified: Secondary | ICD-10-CM | POA: Diagnosis not present

## 2017-03-06 DIAGNOSIS — E559 Vitamin D deficiency, unspecified: Secondary | ICD-10-CM | POA: Diagnosis not present

## 2017-03-06 DIAGNOSIS — I779 Disorder of arteries and arterioles, unspecified: Secondary | ICD-10-CM | POA: Diagnosis not present

## 2017-03-06 DIAGNOSIS — J4540 Moderate persistent asthma, uncomplicated: Secondary | ICD-10-CM

## 2017-03-06 DIAGNOSIS — I10 Essential (primary) hypertension: Secondary | ICD-10-CM

## 2017-03-06 DIAGNOSIS — J309 Allergic rhinitis, unspecified: Secondary | ICD-10-CM

## 2017-03-06 LAB — COMPLETE METABOLIC PANEL WITH GFR
AG Ratio: 1.7 (calc) (ref 1.0–2.5)
ALBUMIN MSPROF: 4.6 g/dL (ref 3.6–5.1)
ALKALINE PHOSPHATASE (APISO): 66 U/L (ref 33–130)
ALT: 9 U/L (ref 6–29)
AST: 17 U/L (ref 10–35)
BILIRUBIN TOTAL: 0.6 mg/dL (ref 0.2–1.2)
BUN: 10 mg/dL (ref 7–25)
CHLORIDE: 100 mmol/L (ref 98–110)
CO2: 30 mmol/L (ref 20–32)
Calcium: 10 mg/dL (ref 8.6–10.4)
Creat: 0.68 mg/dL (ref 0.60–0.93)
GFR, Est African American: 101 mL/min/{1.73_m2} (ref >=60)
GFR, Est Non African American: 87 mL/min/{1.73_m2} (ref >=60)
GLUCOSE: 82 mg/dL (ref 65–99)
Globulin: 2.7 g/dL (calc) (ref 1.9–3.7)
Potassium: 4.3 mmol/L (ref 3.5–5.3)
SODIUM: 140 mmol/L (ref 135–146)
Total Protein: 7.3 g/dL (ref 6.1–8.1)

## 2017-03-06 LAB — EXTRA LAV TOP TUBE

## 2017-03-06 LAB — LIPID PANEL
CHOL/HDL RATIO: 3.2 (calc) (ref <5.0)
Cholesterol: 221 mg/dL — ABNORMAL HIGH (ref <200)
HDL: 69 mg/dL (ref >50)
LDL CHOLESTEROL (CALC): 125 mg/dL — AB
Non-HDL Cholesterol (Calc): 152 mg/dL (calc) — ABNORMAL HIGH (ref <130)
Triglycerides: 152 mg/dL — ABNORMAL HIGH (ref <150)

## 2017-03-06 LAB — TSH: TSH: 9.67 m[IU]/L — AB (ref 0.40–4.50)

## 2017-03-06 NOTE — Progress Notes (Signed)
Patient ID: Alexis [name] MRN: [id number], DOB: 1945/07/16, 72 y.o. Date of Encounter: @DATE @  Chief Complaint:  Chief Complaint  Patient presents with  . Medication Refill    HPI: 72 y.o. year old white female  presents a routine followup office visit.   At her office visit with me 04/03/14 she had continued to followup with Dr. Allyson Sabal regarding her PVD and has been having routine ultrasound tests to follow this also.  At OV 04/30/2015--- I reviewed that her last visit with him was 11/01/2013. Today I have told her to schedule f/u with him. Discussed need to have f/u U/S , Dopplers.  She voices understanding and agrees to call and schedule f/u with him.   At OV 12/03/2015---Reviewed that she still has not had follow-up with Dr. Allyson Sabal or for her ultrasound tests.                        At this visit I am scheduling her for carotid artery Dopplers myself.                        At this visit I have discussed with her the seriousness of the need for her to follow-up with this and she voices understanding and agrees.  At OV 03/06/2017: She reports he did go for the carotid artery Dopplers 01/09/2017-- and was told that they showed no significant change.  However still has had no follow-up with Dr. Allyson Sabal.  Reports that she is noticing no symptoms related to this PAD.  GERD:  03/06/2017:She states that she does have to take her protonix every day or else she has heartburn symptoms. However with her protonix her symptoms are controlled.  Generalized anxiety disorder:   She is on Celexa 40 mg. She states this is working well and is controlling her anxiety very well. She is having no adverse effects. At OV 04/30/2015 she does report that it has recently been the anniversary of when her mother passed away.  She also reports that she is now the age that her mother was when she passed away and her daughter is the age that pt was when her mom passed away.  Says that this has been causing her some stress  but says that she is able to deal with it okay and feels that her anxiety is controlled and stable. 03/06/2017: She reports that the current dose of Celexa continues to work well for her.  Is controlling her anxiety.  Feels that her mood is stable.  Is having no adverse effects.  Vitamin D deficiency: 03/06/2017: She is taking 2000 units daily.  Thyroid: 03/06/2017: She is taking the prescription 75 mcg daily. No change in her hair or skin and no weight changes or palpitations.  Hypertension:  03/06/2017: Diovan/valsartan was discontinued secondary to recall on medication.  Is continuing other medications as directed.  States that blood pressure readings have been good when she has been to the allergist recently. No chest pressure, heaviness, tightness, squeezing even with exertion. No increased shortness of breath/dyspnea on exertion.  Multiple Allergies:  03/06/2017: Reports that they found that she is allergic to cats but states that she absolutely loves her cat and can not get rid of her.  However is glad that she found out she is not allergic to eggs so now she has been eating eggs and is excited about this. This is being managed by the asthma and allergy center.  She has no complaints or concerns today. Says that her current medications are working very well for her.  Past Medical History:  Diagnosis Date  . Anxiety   . Claudication (HCC)    stent in bil iliac 11/25/10  . GERD (gastroesophageal reflux disease)   . HTN (hypertension)   . Hyperlipemia   . Hypothyroidism   . Murmur, cardiac    echo 10/12/10- EF >55%, mod sclerotic aortic valve  . PVD (peripheral vascular disease) (HCC)    lower ext and upper ext-L subclavian stenosis by ultrasound, nl nuclear test- 10/27/10  . Tobacco abuse   . Vitamin D deficiency      Home Meds:  Outpatient Medications Prior to Visit  Medication Sig Dispense Refill  . albuterol (VENTOLIN HFA) 108 (90 Base) MCG/ACT inhaler INHALE 2 PUFFS INTO THE LUNGS  EVERY 6 (SIX) HOURS AS NEEDED FOR WHEEZING 18 g 1  . aspirin 81 MG tablet Take 81 mg by mouth daily.    Marland Kitchen. atorvastatin (LIPITOR) 10 MG tablet TAKE 1 TABLET BY MOUTH ONCE A DAY *NEED OFFICE VISIT 30 tablet 0  . benazepril (LOTENSIN) 40 MG tablet TAKE 1 TABLET BY MOUTH DAILY 90 tablet 1  . BYSTOLIC 10 MG tablet TAKE 1 TABLET BY MOUTH ONCE A DAY *NEED OFFICE VISIT 30 tablet 0  . cholecalciferol (VITAMIN D) 1000 UNITS tablet Take 2,000 Units by mouth daily.    . citalopram (CELEXA) 40 MG tablet TAKE 1 TABLET BY MOUTH DAILY 90 tablet 1  . Crisaborole (EUCRISA) 2 % OINT Apply 1 application topically 2 (two) times daily. 60 g 5  . diphenhydrAMINE (BENADRYL) 25 mg capsule Take 25 mg by mouth. 2 tabs at night    . fluticasone furoate-vilanterol (BREO ELLIPTA) 200-25 MCG/INH AEPB Inhale 1 puff into the lungs daily. 60 each 5  . levothyroxine (SYNTHROID, LEVOTHROID) 75 MCG tablet TAKE 1 TABLET BY MOUTH DAILY 90 tablet 1  . montelukast (SINGULAIR) 10 MG tablet TAKE ONE TABLET BY MOUTH EVERY NIGHT AT BEDTIME 90 tablet 1  . olopatadine (PATANOL) 0.1 % ophthalmic solution Place 1 drop into both eyes 2 (two) times daily. 5 mL 5  . pantoprazole (PROTONIX) 40 MG tablet TAKE 1 TABLET BY MOUTH DAILY 90 tablet 2  . pimecrolimus (ELIDEL) 1 % cream Apply topically 2 (two) times daily as needed. 30 g 3  . tacrolimus (PROTOPIC) 0.1 % ointment Apply topically 2 (two) times daily. 100 g 0  . amLODipine (NORVASC) 10 MG tablet TAKE 1 TABLET BY MOUTH DAILY (Patient not taking: Reported on 03/06/2017) 30 tablet 1  .      . predniSONE (DELTASONE) 20 MG tablet Take 3 daily for 2 days, then 2 daily for 2 days, then 1 daily for 2 days. 12 tablet 0   No facility-administered medications prior to visit.      Allergies:  Allergies  Allergen Reactions  . Banana   . Chicken Allergy   . Demerol [Meperidine]   . Eggs Or Egg-Derived Products   . Metoprolol     asthma  . Milk-Related Compounds   . Orange Fruit [Citrus]   .  Other     [place], pecans  . Pineapple   . Salmon [Fish Allergy]     And tuna and oysters  . Penicillins Rash    Social History   Socioeconomic History  . Marital status: Married    Spouse name: Not on file  . Number of children: Not on file  . Years of  education: Not on file  . Highest education level: Not on file  Social Needs  . Financial resource strain: Not on file  . Food insecurity - worry: Not on file  . Food insecurity - inability: Not on file  . Transportation needs - medical: Not on file  . Transportation needs - non-medical: Not on file  Occupational History  . Not on file  Tobacco Use  . Smoking status: Former Smoker    Last attempt to quit: 02/28/2002    Years since quitting: 15.0  . Smokeless tobacco: Never Used  Substance and Sexual Activity  . Alcohol use: No  . Drug use: No  . Sexual activity: Not on file  Other Topics Concern  . Not on file  Social History Narrative  . Not on file    Family History  Problem Relation Age of Onset  . Allergic rhinitis Father   . Allergic rhinitis Maternal Grandmother   . Urticaria Maternal Grandmother   . Asthma Maternal Grandfather   . Allergic rhinitis Paternal Grandmother   . Angioedema Neg Hx   . Eczema Neg Hx   . Immunodeficiency Neg Hx      Review of Systems:  See HPI for pertinent ROS. All other ROS negative.    Physical Exam: Blood pressure 118/70, pulse (!) 55, temperature 97.8 F (36.6 C), temperature source Oral, resp. rate 14, weight 49.4 kg (109 lb), SpO2 96 %., Body mass index is 21.65 kg/m. General: Very pleasant WF. Appears in no acute distress. Neck: Supple. No thyromegaly. No lymphadenopathy.  Loud, harsh carotid bruit on the right.  Soft, high pitch carotid bruit on the left. Lungs: Clear bilaterally to auscultation without wheezes, rales, or rhonchi. Breathing is unlabored. Heart: RRR with S1 S2. No murmurs, rubs, or gallops. Abdomen: Soft, non-tender, non-distended with normoactive  bowel sounds. No hepatomegaly. No rebound/guarding. No obvious abdominal masses. Musculoskeletal:  Strength and tone normal for age. Extremities/Skin: Warm and dry. No LE edema.  Neuro: Alert and oriented X 3. Moves all extremities spontaneously. Gait is normal. CNII-XII grossly in tact. Psych:  Responds to questions appropriately with a normal affect.     ASSESSMENT AND PLAN:  72 y.o. year old female with  1. Anxiety 03/06/2017:Stable, controlled with Celexa 40mg .  2. Carotid artery disease 03/06/2017: I have repeatedly encouraged her to follow-up with Dr. Allyson Sabal but she has deferred/refused.  Most recently I went ahead and ordered follow-up carotid Dopplers.   3. GERD (gastroesophageal reflux disease) 03/06/2017:Well-controlled with her protonix but she does have to take this every day in order to control symptoms  4. HTN (hypertension) 03/06/2017:Blood pressure at goal/controlled.  Continue current medications and check lab to monitor. - COMPLETE METABOLIC PANEL WITH GFR  5. Hyperlipemia 03/06/2017:She is fasting. She is on Lipitor.  - COMPLETE METABOLIC PANEL WITH GFR - Lipid panel  6. Hypothyroidism 03/06/2017:On levothyroxine 75 mcg daily - TSH  7. Peripheral vascular disease with claudication Per Dr. Allyson Sabal.  At OV 04/29/2105--- Instructed her to call and schedule follow-up with Dr. Allyson Sabal - Lipid panel---was checked 04/03/14 and was controlled with triglyceride 204 HDL 40 LDL 93. - CBC was normal 04/03/14. This was checked at that time she is on aspirin and Plavix. Can wait to recheck this. 03/06/2017: At every office visit I have encouraged her to follow-up with Dr. Allyson Sabal but she has refused.  She states that symptomatically this is all stable.  8. Subclavian artery stenosis, left Per Dr. Allyson Sabal At [facility] 04/29/2105--- Instructed her to  call and schedule follow-up with Dr. Allyson Sabal - Lipid panel---was checked 04/03/14 and was controlled with triglyceride 204 HDL 40 LDL 93. - CBC was normal 04/03/14.  This was checked at that time she is on aspirin and Plavix. Can wait to recheck this. 03/06/2017:  At every office visit I have encouraged her to follow-up with Dr. Allyson Sabal but she has refused.  She states that symptomatically this is all stable.  9. Vitamin D deficiency She is taking 2000 units daily - Vit D  25 hydroxy (rtn osteoporosis monitoring)----vitamin D level was checked 04/03/14 and was good at that time. Recheck 04/2015  10. Preventive health care: I have discussed mammogram/pelvic exam/Pap smear/Dexa/colonoscopy/immunizations with her multiple times in the past and again today. She still defers. She is aware of risk and benefits of each procedure but still refuses.  Ideally she needs follow-up office visit with Dr. Allyson Sabal and follow-up vascular Dopplers for #7 and #8 above in addition to the carotid Dopplers. 11/2015---However at this time she is deferring all of this so I have discussed that she at least needs to get the carotid Dopplers and she is agreeable to do so. She had carotid dopplers  01/09/2017- Showed no high-grade stenosis 03/06/2017--Will continue to order routine f/u carotid dopplers.  Regular office visit and labs in 6 months or sooner if needed.   Signed, [address] [place], [place], [facility] 03/06/2017 11:46 AM

## 2017-03-08 ENCOUNTER — Ambulatory Visit (INDEPENDENT_AMBULATORY_CARE_PROVIDER_SITE_OTHER): Payer: Medicare Other | Admitting: Allergy & Immunology

## 2017-03-08 ENCOUNTER — Encounter: Payer: Self-pay | Admitting: Allergy & Immunology

## 2017-03-08 ENCOUNTER — Telehealth: Payer: Self-pay

## 2017-03-08 VITALS — BP 120/66 | HR 58 | Resp 17

## 2017-03-08 DIAGNOSIS — H1013 Acute atopic conjunctivitis, bilateral: Secondary | ICD-10-CM

## 2017-03-08 DIAGNOSIS — J4541 Moderate persistent asthma with (acute) exacerbation: Secondary | ICD-10-CM

## 2017-03-08 DIAGNOSIS — J3089 Other allergic rhinitis: Secondary | ICD-10-CM

## 2017-03-08 DIAGNOSIS — J309 Allergic rhinitis, unspecified: Secondary | ICD-10-CM

## 2017-03-08 NOTE — Telephone Encounter (Signed)
-----   Message from Dorena BodoMary B Dixon, PA-C sent at 03/07/2017  6:30 AM EST ----- Thyroid lab is a little out of range and cholesterol is a little too high.  Find out if she has been out of either of these meds or has been skipping doses. Let me know.

## 2017-03-08 NOTE — Telephone Encounter (Signed)
Called placed to patient to discuss lab results. Patient states she was out of her thyroid medication for about a week and she forgets to take her cholesterol medication.  Patient was informed to take medications daily as prescribed as it is causing her numbers to be elevated when she miss  doses. Patient verbalizes understanding

## 2017-03-08 NOTE — Progress Notes (Signed)
FOLLOW UP  Date of Service/Encounter:  03/08/17   Assessment:   Perennial allergic rhinitis (cat, dog, dust mites)  Allergic rhinoconjunctivitis of both eyes  Moderate persistent asthma with acute exacerbation   Asthma Reportables:  Severity: moderate persistent  Risk: high Control: not well controlled due to non-compliance  Plan/Recommendations:   1. Perennial allergic rhinitis (cat, dog, dust mite) - Start the prednisone pack provided today to provide relief. - You can increase your Xyzal to 5mg  twice daily.  - Continue with the use of the eye drops once daily as needed.  - We will adjust her maintenance dosing of her shots, if needed. - She was recently changed to Schedule A and if she is unable to advance on this Schedule, we will change her maintenance to 0.16mL, which is the highest she has been able to reach.   2. Moderate persistent asthma - with acute exacerbation - Lung function looked worse today, but it did improve with the albuterol treatment. - Be sure to take your Breo every day even if you are feeling fine.  - Daily controller medication(s): Singulair 10mg  daily and Breo 200/30mcg one puff once daily - Prior to physical activity: ProAir 2 puffs 10-15 minutes before physical activity. - Rescue medications: ProAir 4 puffs every 4-6 hours as needed - Asthma control goals:  * Full participation in all desired activities (may need albuterol before activity) * Albuterol use two time or less a week on average (not counting use with activity) * Cough interfering with sleep two time or less a month * Oral steroids no more than once a year * No hospitalizations  3. Return in about 6 months (around 09/05/2017).  Subjective:   Alexis [name] is a 72 y.o. female presenting today for follow up of  Chief Complaint  Patient presents with  . Allergic Rhinitis     Alexis [name] has a history of the following: Patient Active Problem List   Diagnosis Date Noted  .  Dermatitis 09/09/2016  . History of smoking greater than 50 pack years 08/24/2016  . Moderate persistent asthma, uncomplicated 08/24/2016  . Allergic rhinitis 06/11/2014  . Asthma with acute exacerbation 06/11/2014  . Extrinsic asthma 06/11/2014  . Hyperlipemia   . HTN (hypertension)   . Anxiety   . Hypothyroidism   . GERD (gastroesophageal reflux disease)   . Vitamin D deficiency   . Peripheral vascular disease with claudication (HCC) 07/19/2012  . Carotid artery disease (HCC) 07/19/2012  . Subclavian artery stenosis, left (HCC) 07/19/2012    History obtained from: chart review and patient.  Alexis [name]'s Primary Care Provider is Dorena Bodo, PA-C.     Alexis is a 72 y.o. female presenting for a follow up visit. She was last seen by me in July 2018 for extensive testing secondary to bilateral periorbital pruritis and erythema. She did have testing that was positive to dust mites, cat, and dog. She has since started immunotherapy with these allergens. Because of concern for cosmetic contact dermatitis, we did do patch testing which was negative to the entire True Test panel.   Since the last visit, she has done well. However, her cats have been more cuddly lately, which has flared her eyes. She now has bilateral periorbital erythema and pruritis. Overall her allergy injections have gone well and she feels that they are providing some improvement in her symptoms. She remains on the Xyzal but is only taking one daily. She does use Benadryl at night when needed. She  continues to use her eye drops as needed.   She remains on the [place], but she is not compliant with it. She denies current symptoms, although she does not know where her rescue inhaler is at this time. [name]'s asthma has been well controlled. She has not required rescue medication, experienced nocturnal awakenings due to lower respiratory symptoms, nor have activities of daily living been limited. She has required no  Emergency Department or Urgent Care visits for her asthma. She has required zero courses of systemic steroids for asthma exacerbations since the last visit. ACT score today is 23, indicating excellent asthma symptom control.  Alexis Hashimotoatricia is on allergen immunotherapy. She receives one injection. Immunotherapy script #1 contains dust mites, cat and dog. She currently receives 0.2410mL of the RED vial (1/100). She started shots July of 2018 and reached maintenance in September of 2018. Her Red Vial dose has fluctuated recently with the increase in the Motorolaed Vial. She has gotten up as high as 0.1092mL but has never progressed beyond that.   Otherwise, there have been no changes to her past medical history, surgical history, family history, or social history.    Review of Systems: a 14-point review of systems is pertinent for what is mentioned in HPI.  Otherwise, all other systems were negative. Constitutional: negative other than that listed in the HPI Eyes: negative other than that listed in the HPI Ears, nose, mouth, throat, and face: negative other than that listed in the HPI Respiratory: negative other than that listed in the HPI Cardiovascular: negative other than that listed in the HPI Gastrointestinal: negative other than that listed in the HPI Genitourinary: negative other than that listed in the HPI Integument: negative other than that listed in the HPI Hematologic: negative other than that listed in the HPI Musculoskeletal: negative other than that listed in the HPI Neurological: negative other than that listed in the HPI Allergy/Immunologic: negative other than that listed in the HPI    Objective:   Blood pressure 120/66, pulse (!) 58, resp. rate 17, SpO2 91 %. There is no height or weight on file to calculate BMI.   Physical Exam:  General: Alert, interactive, in no acute distress. Smiling and interactive. Very pleasant female.  Eyes: Bilateral periorbital edema and erythema,  Conjunctival injection bilaterally with limbal sparing, no discharge on the right, no discharge on the left, no Horner-Trantas dots present and allergic shiners present bilaterally. PERRL bilaterally. EOMI without pain. No photophobia.  Ears: Right TM pearly gray with normal light reflex, Left TM pearly gray with normal light reflex, Right TM intact without perforation and Left TM intact without perforation.  Nose/Throat: External nose within normal limits, nasal crease present and septum midline. Turbinates edematous with clear discharge. Posterior oropharynx erythematous without cobblestoning in the posterior oropharynx. Tonsils 2+ without exudates.  Tongue without thrush. Adenopathy: no enlarged lymph nodes appreciated in the anterior cervical, occipital, axillary, epitrochlear, inguinal, or popliteal regions. Lungs: Decreased breath sounds bilaterally without wheezing, rhonchi or rales. No increased work of breathing. CV: Normal S1/S2. No murmurs. Capillary refill <2 seconds.  Skin: Warm and dry, without lesions or rashes. Neuro:   Grossly intact. No focal deficits appreciated. Responsive to questions.  Diagnostic studies:   Spirometry: results abnormal (FEV1: 0.91/51%, FVC: 1.56/68%, FEV1/FVC: 58%).    Spirometry consistent with possible restrictive disease. Albuterol/Atrovent nebulizer treatment given in clinic with improvement in FEV1 and FVC, but not significant per ATS criteria.  Allergy Studies: none     Malachi BondsJoel Saamiya Jeppsen, MD Ginette OttoFAAAAI  [facility]

## 2017-03-08 NOTE — Patient Instructions (Addendum)
1. Perennial allergic rhinitis (cat, dog, dust mite) - Start the prednisone pack provided today to provide relief. - You can increase your Xyzal to 5mg  twice daily.  - Continue with the use of the eye drops once daily as needed.   2. Moderate persistent asthma - Lung function looked worse today, but it did improve with the albuterol treatment. - Be sure to take your Breo every day even if you are feeling fine.  - Daily controller medication(s): Singulair 10mg  daily and Breo 200/4725mcg one puff once daily - Prior to physical activity: ProAir 2 puffs 10-15 minutes before physical activity. - Rescue medications: ProAir 4 puffs every 4-6 hours as needed - Asthma control goals:  * Full participation in all desired activities (may need albuterol before activity) * Albuterol use two time or less a week on average (not counting use with activity) * Cough interfering with sleep two time or less a month * Oral steroids no more than once a year * No hospitalizations  3. Return in about 6 months (around 09/05/2017).    Please inform us of any Emergency Department visits, hospitalizations, or changes in symptoms. Call us before going to the ED for breathing or allergy symptoms since we might be able to fit you in for a sick visit. Feel free to contact us anytime with any questions, problems, or concerns.  It was a pleasure to see you again today! Happy New Year! Tell your husband we said hello!   Websites that have reliable patient information: 1. American Academy of Asthma, Allergy, and Immunology: www.aaaai.org 2. Food Allergy Research and Education (FARE): foodallergy.org 3. Mothers of Asthmatics: http://www.asthmacommunitynetwork.org 4. American College of Allergy, Asthma, and Immunology: www.acaai.org

## 2017-03-17 ENCOUNTER — Ambulatory Visit (INDEPENDENT_AMBULATORY_CARE_PROVIDER_SITE_OTHER): Payer: Medicare Other

## 2017-03-17 DIAGNOSIS — J309 Allergic rhinitis, unspecified: Secondary | ICD-10-CM | POA: Diagnosis not present

## 2017-03-27 ENCOUNTER — Ambulatory Visit (INDEPENDENT_AMBULATORY_CARE_PROVIDER_SITE_OTHER): Payer: Medicare Other | Admitting: *Deleted

## 2017-03-27 DIAGNOSIS — J309 Allergic rhinitis, unspecified: Secondary | ICD-10-CM | POA: Diagnosis not present

## 2017-03-31 ENCOUNTER — Other Ambulatory Visit: Payer: Self-pay

## 2017-03-31 MED ORDER — NEBIVOLOL HCL 10 MG PO TABS: ORAL_TABLET | ORAL | 0 refills | Status: DC

## 2017-04-12 ENCOUNTER — Other Ambulatory Visit: Payer: Self-pay

## 2017-04-12 MED ORDER — ATORVASTATIN CALCIUM 10 MG PO TABS: ORAL_TABLET | ORAL | 0 refills | Status: DC

## 2017-04-14 ENCOUNTER — Ambulatory Visit (INDEPENDENT_AMBULATORY_CARE_PROVIDER_SITE_OTHER): Payer: Medicare Other | Admitting: *Deleted

## 2017-04-14 DIAGNOSIS — J309 Allergic rhinitis, unspecified: Secondary | ICD-10-CM | POA: Diagnosis not present

## 2017-04-19 ENCOUNTER — Other Ambulatory Visit: Payer: Self-pay

## 2017-04-19 DIAGNOSIS — K219 Gastro-esophageal reflux disease without esophagitis: Secondary | ICD-10-CM

## 2017-04-19 MED ORDER — NEBIVOLOL HCL 10 MG PO TABS: ORAL_TABLET | ORAL | 0 refills | Status: DC

## 2017-04-19 MED ORDER — BENAZEPRIL HCL 40 MG PO TABS: 40.0000 mg | ORAL_TABLET | Freq: Every day | ORAL | 1 refills | Status: DC

## 2017-04-19 MED ORDER — CITALOPRAM HYDROBROMIDE 40 MG PO TABS: 40.0000 mg | ORAL_TABLET | Freq: Every day | ORAL | 1 refills | Status: DC

## 2017-04-19 MED ORDER — PANTOPRAZOLE SODIUM 40 MG PO TBEC: 40.0000 mg | DELAYED_RELEASE_TABLET | Freq: Every day | ORAL | 2 refills | Status: DC

## 2017-04-25 ENCOUNTER — Ambulatory Visit (INDEPENDENT_AMBULATORY_CARE_PROVIDER_SITE_OTHER): Payer: Medicare Other | Admitting: *Deleted

## 2017-04-25 DIAGNOSIS — J309 Allergic rhinitis, unspecified: Secondary | ICD-10-CM

## 2017-05-03 ENCOUNTER — Other Ambulatory Visit: Payer: Self-pay | Admitting: Physician Assistant

## 2017-05-04 ENCOUNTER — Ambulatory Visit (INDEPENDENT_AMBULATORY_CARE_PROVIDER_SITE_OTHER): Payer: Medicare Other | Admitting: *Deleted

## 2017-05-04 DIAGNOSIS — J309 Allergic rhinitis, unspecified: Secondary | ICD-10-CM

## 2017-05-08 ENCOUNTER — Other Ambulatory Visit: Payer: Self-pay | Admitting: Physician Assistant

## 2017-05-11 ENCOUNTER — Ambulatory Visit (INDEPENDENT_AMBULATORY_CARE_PROVIDER_SITE_OTHER): Payer: Medicare Other | Admitting: *Deleted

## 2017-05-11 DIAGNOSIS — J309 Allergic rhinitis, unspecified: Secondary | ICD-10-CM | POA: Diagnosis not present

## 2017-05-18 ENCOUNTER — Encounter: Payer: Self-pay | Admitting: Allergy & Immunology

## 2017-05-18 ENCOUNTER — Ambulatory Visit (INDEPENDENT_AMBULATORY_CARE_PROVIDER_SITE_OTHER): Payer: Medicare Other | Admitting: Allergy & Immunology

## 2017-05-18 ENCOUNTER — Ambulatory Visit: Payer: Self-pay

## 2017-05-18 VITALS — BP 112/62 | HR 76 | Resp 16

## 2017-05-18 DIAGNOSIS — J309 Allergic rhinitis, unspecified: Secondary | ICD-10-CM

## 2017-05-18 DIAGNOSIS — J4540 Moderate persistent asthma, uncomplicated: Secondary | ICD-10-CM

## 2017-05-18 DIAGNOSIS — J3089 Other allergic rhinitis: Secondary | ICD-10-CM | POA: Diagnosis not present

## 2017-05-18 DIAGNOSIS — L2084 Intrinsic (allergic) eczema: Secondary | ICD-10-CM

## 2017-05-18 MED ORDER — TRIAMCINOLONE ACETONIDE 0.1 % EX OINT: 1.0000 "application " | TOPICAL_OINTMENT | Freq: Two times a day (BID) | CUTANEOUS | 0 refills | Status: DC

## 2017-05-18 MED ORDER — AZELASTINE HCL 0.05 % OP SOLN: 1.0000 [drp] | Freq: Two times a day (BID) | OPHTHALMIC | 5 refills | Status: DC

## 2017-05-18 NOTE — Patient Instructions (Addendum)
1. Perennial allergic rhinitis (cat, dog, dust mite) - We will change to azelastine eye drops due to your insurance coverage.  - You can increase your Xyzal to 5mg  twice daily.  - Continue with the use of the eye drops once daily as needed.   2. Moderate persistent asthma - Lung function looked much better today.  - It seems that taking your Breo every day has helped with your symptoms.  - Daily controller medication(s): Singulair 10mg  daily and Breo 200/7325mcg one puff once daily - Prior to physical activity: ProAir 2 puffs 10-15 minutes before physical activity. - Rescue medications: ProAir 4 puffs every 4-6 hours as needed - Asthma control goals:  * Full participation in all desired activities (may need albuterol before activity) * Albuterol use two time or less a week on average (not counting use with activity) * Cough interfering with sleep two time or less a month * Oral steroids no more than once a year    * No hospitalizations.   3. Eczema - Continue with your twice daily Xyzal to help control itching. - Continue on Eucrisa twice daily as needed. - Add on triamcinolone 0.1% ointment twice daily as needed. - We will provide information on Dupixent and Tammy will reach out to you regarding the cost of treatment. - If the Dupixent is not affordable, we will get you in touch with our research team for the new drug trial (Dupixent + another medication together).    4. Return in about 3 months (around 08/18/2017).   Please inform us of any Emergency Department visits, hospitalizations, or changes in symptoms. Call us before going to the ED for breathing or allergy symptoms since we might be able to fit you in for a sick visit. Feel free to contact us anytime with any questions, problems, or concerns.  It was a pleasure to see you again today! Tell you husband we said hello!   Websites that have reliable patient information: 1. American Academy of Asthma, Allergy, and Immunology:  www.aaaai.org 2. Food Allergy Research and Education (FARE): foodallergy.org 3. Mothers of Asthmatics: http://www.asthmacommunitynetwork.org 4. American College of Allergy, Asthma, and Immunology: www.acaai.org

## 2017-05-18 NOTE — Progress Notes (Signed)
FOLLOW UP  Date of Service/Encounter:  05/18/17   Assessment:   Moderate persistent asthma without complication  Intrinsic atopic dermatitis  Perennial allergic rhinitis (dog, cat, dust mites)   Asthma Reportables:  Severity: moderate persistent  Risk: low Control: well controlled  Plan/Recommendations:   1. Perennial allergic rhinitis (cat, dog, dust mite) - We will change to azelastine eye drops due to your insurance coverage.  - You can increase your Xyzal to 5mg  twice daily.  - Continue with the use of the eye drops once daily as needed.   2. Moderate persistent asthma - Lung function looked much better today.  - It seems that taking your Breo every day has helped with your symptoms.  - Daily controller medication(s): Singulair 10mg  daily and Breo 200/68mcg one puff once daily - Prior to physical activity: ProAir 2 puffs 10-15 minutes before physical activity. - Rescue medications: ProAir 4 puffs every 4-6 hours as needed - Asthma control goals:  * Full participation in all desired activities (may need albuterol before activity) * Albuterol use two time or less a week on average (not counting use with activity) * Cough interfering with sleep two time or less a month * Oral steroids no more than once a year    * No hospitalizations.   3. Eczema - Continue with your twice daily Xyzal to help control itching. - Continue on Eucrisa twice daily as needed. - Add on triamcinolone 0.1% ointment twice daily as needed. - We will provide information on Dupixent and Tammy will reach out to you regarding the cost of treatment. - If the Dupixent is not affordable, we will get you in touch with our research team for the new drug trial (Dupixent + another medication together).   - One way or another, I am hopeful that we can get you on Dupixent.   4. Return in about 3 months (around 08/18/2017).  Subjective:   Alexis [name] is a 72 y.o. female presenting today for follow up  of  Chief Complaint  Patient presents with  . Eczema    Alexis [name] has a history of the following: Patient Active Problem List   Diagnosis Date Noted  . Dermatitis 09/09/2016  . History of smoking greater than 50 pack years 08/24/2016  . Moderate persistent asthma, uncomplicated 08/24/2016  . Allergic rhinitis 06/11/2014  . Asthma with acute exacerbation 06/11/2014  . Extrinsic asthma 06/11/2014  . Hyperlipemia   . HTN (hypertension)   . Anxiety   . Hypothyroidism   . GERD (gastroesophageal reflux disease)   . Vitamin D deficiency   . Peripheral vascular disease with claudication (HCC) 07/19/2012  . Carotid artery disease (HCC) 07/19/2012  . Subclavian artery stenosis, left (HCC) 07/19/2012    History obtained from: chart review and patient.  Alexis [name]'s Primary Care Provider is Dorena Bodo, PA-C.     [name] is a 72 y.o. female presenting for a follow up visit. She was last seen in January 2019. At that time, her asthma was not well controlled but it was determined that she was not using her Breo on a regular basis. I recommended using her Virgel Bouquet more routinely. We started her on a small prednisone burst to help control her allergic rhinoconjunctivitis as well as her asthma. We continued her on her allergen immunotherapy with good results. We also continued Xyzal, increasing to twice daily. We froze her immunotherapy at 0.3mL since this was the highest that she was able to tolerate.   Since  the last visit, her eczema has been flaring. She has been using the cream routinely Alexis [name](Eucria). She is now paying $114 for her [place]risa. She does moisturize twice daily. Currently the worst areas are on her lower extremities, especially behind her knees. She is interested in other modalities to treat her eczema. She has been using hydrocortisone but does not recall being on triamcinolone in the past. She has been on both Protopic and Elidel without improvement. She has not needed any  antibiotics for Staphylococcal skin infections.  Allergy shots are going well. She is frozen at 0.672mL. She continues to sleep with her cats, which is likely the trigger for both her ocular symptoms as well as her skin problems.   Otherwise, there have been no changes to her past medical history, surgical history, family history, or social history.    Review of Systems: a 14-point review of systems is pertinent for what is mentioned in HPI.  Otherwise, all other systems were negative. Constitutional: negative other than that listed in the HPI Eyes: negative other than that listed in the HPI Ears, nose, mouth, throat, and face: negative other than that listed in the HPI Respiratory: negative other than that listed in the HPI Cardiovascular: negative other than that listed in the HPI Gastrointestinal: negative other than that listed in the HPI Genitourinary: negative other than that listed in the HPI Integument: negative other than that listed in the HPI Hematologic: negative other than that listed in the HPI Musculoskeletal: negative other than that listed in the HPI Neurological: negative other than that listed in the HPI Allergy/Immunologic: negative other than that listed in the HPI    Objective:   Blood pressure 112/62, pulse 76, resp. rate 16. There is no height or weight on file to calculate BMI.   Physical Exam:  General: Alert, interactive, in no acute distress. Pleasant and smiling.  Eyes: No conjunctival injection bilaterally, no discharge on the right, no discharge on the left and no Horner-Trantas dots present. PERRL bilaterally. EOMI without pain. No photophobia.  Ears: Right TM pearly gray with normal light reflex, Left TM pearly gray with normal light reflex, Right TM intact without perforation and Left TM intact without perforation.  Nose/Throat: External nose within normal limits and septum midline. Turbinates edematous and pale without discharge. Posterior  oropharynx mildly erythematous without cobblestoning in the posterior oropharynx. Tonsils 2+ without exudates.  Tongue without thrush. Lungs: Clear to auscultation without wheezing, rhonchi or rales. No increased work of breathing. CV: Normal S1/S2. No murmurs. Capillary refill <2 seconds.  Skin: Warm and dry, without lesions or rashes. Neuro:   Grossly intact. No focal deficits appreciated. Responsive to questions.  Diagnostic studies: none  Spirometry: results abnormal (FEV1: 1.10/61%, FVC: 1.54/64%, FEV1/FVC: 71%).    Spirometry consistent with possible restrictive disease. Overall her values are slightly better than those obtained at the last visit   Allergy Studies: none     Malachi BondsJoel Jayke Caul, MD [facility]

## 2017-05-23 NOTE — Progress Notes (Signed)
VIAL EXP 05-25-18 

## 2017-05-25 ENCOUNTER — Ambulatory Visit (INDEPENDENT_AMBULATORY_CARE_PROVIDER_SITE_OTHER): Payer: Medicare Other | Admitting: *Deleted

## 2017-05-25 DIAGNOSIS — J309 Allergic rhinitis, unspecified: Secondary | ICD-10-CM | POA: Diagnosis not present

## 2017-05-29 DIAGNOSIS — J3089 Other allergic rhinitis: Secondary | ICD-10-CM | POA: Diagnosis not present

## 2017-06-01 ENCOUNTER — Other Ambulatory Visit: Payer: Self-pay

## 2017-06-01 MED ORDER — LEVOTHYROXINE SODIUM 75 MCG PO TABS: 75.0000 ug | ORAL_TABLET | Freq: Every day | ORAL | 1 refills | Status: DC

## 2017-06-06 ENCOUNTER — Other Ambulatory Visit: Payer: Self-pay | Admitting: Physician Assistant

## 2017-06-07 ENCOUNTER — Ambulatory Visit (INDEPENDENT_AMBULATORY_CARE_PROVIDER_SITE_OTHER): Payer: Medicare Other | Admitting: *Deleted

## 2017-06-07 DIAGNOSIS — J309 Allergic rhinitis, unspecified: Secondary | ICD-10-CM

## 2017-06-15 ENCOUNTER — Ambulatory Visit (INDEPENDENT_AMBULATORY_CARE_PROVIDER_SITE_OTHER): Payer: Medicare Other | Admitting: *Deleted

## 2017-06-15 DIAGNOSIS — J309 Allergic rhinitis, unspecified: Secondary | ICD-10-CM | POA: Diagnosis not present

## 2017-06-28 ENCOUNTER — Ambulatory Visit (INDEPENDENT_AMBULATORY_CARE_PROVIDER_SITE_OTHER): Payer: Medicare Other

## 2017-06-28 DIAGNOSIS — J309 Allergic rhinitis, unspecified: Secondary | ICD-10-CM | POA: Diagnosis not present

## 2017-07-10 ENCOUNTER — Other Ambulatory Visit: Payer: Self-pay | Admitting: Physician Assistant

## 2017-07-11 ENCOUNTER — Ambulatory Visit (INDEPENDENT_AMBULATORY_CARE_PROVIDER_SITE_OTHER): Payer: Medicare Other | Admitting: *Deleted

## 2017-07-11 DIAGNOSIS — J309 Allergic rhinitis, unspecified: Secondary | ICD-10-CM

## 2017-07-19 ENCOUNTER — Telehealth: Payer: Self-pay

## 2017-07-19 NOTE — Telephone Encounter (Signed)
Azelastine works - one drop per eye BID PRN.  Thanks, Malachi Bonds, MD [facility]

## 2017-07-19 NOTE — Addendum Note (Signed)
Addended by: Mliss Fritz I on: 07/19/2017 01:29 PM   Modules accepted: Orders

## 2017-07-19 NOTE — Telephone Encounter (Signed)
Prescription was changed to this on 05-18-2017. Auto PA request sent from pharmacy on olopatadine. No further info needed.

## 2017-07-19 NOTE — Telephone Encounter (Signed)
Patient's insurance will not cover olopatadine 0.1%. Alternatives that patient must try/fail are cromolyn and azelastine eye drop. Please advise and thank you.

## 2017-07-25 ENCOUNTER — Ambulatory Visit (INDEPENDENT_AMBULATORY_CARE_PROVIDER_SITE_OTHER): Payer: Medicare Other | Admitting: *Deleted

## 2017-07-25 DIAGNOSIS — J309 Allergic rhinitis, unspecified: Secondary | ICD-10-CM | POA: Diagnosis not present

## 2017-07-27 NOTE — Progress Notes (Signed)
VIALS EXP 07-29-18 

## 2017-07-28 DIAGNOSIS — J3089 Other allergic rhinitis: Secondary | ICD-10-CM | POA: Diagnosis not present

## 2017-08-07 ENCOUNTER — Other Ambulatory Visit: Payer: Self-pay | Admitting: Physician Assistant

## 2017-08-07 ENCOUNTER — Ambulatory Visit (INDEPENDENT_AMBULATORY_CARE_PROVIDER_SITE_OTHER): Payer: Medicare Other | Admitting: *Deleted

## 2017-08-07 ENCOUNTER — Telehealth: Payer: Self-pay | Admitting: *Deleted

## 2017-08-07 DIAGNOSIS — J309 Allergic rhinitis, unspecified: Secondary | ICD-10-CM | POA: Diagnosis not present

## 2017-08-07 NOTE — Telephone Encounter (Signed)
I have reached out to patient on 05/30/17, 07/05/17 and 07/18/17 and left message for patient to contact me regarding Dupixent with no response back.  I do know that the research team did speak to her about 2 weeks ago and she advised she did not want to do study because she would have to D/Cher immunotherapy she is getting. If in the future she wants to contact me to discuss submission and affordability we can revisit then.

## 2017-08-18 ENCOUNTER — Ambulatory Visit: Payer: Self-pay

## 2017-08-22 ENCOUNTER — Ambulatory Visit (INDEPENDENT_AMBULATORY_CARE_PROVIDER_SITE_OTHER): Payer: Medicare Other | Admitting: *Deleted

## 2017-08-22 DIAGNOSIS — J309 Allergic rhinitis, unspecified: Secondary | ICD-10-CM

## 2017-08-29 ENCOUNTER — Other Ambulatory Visit: Payer: Self-pay | Admitting: Physician Assistant

## 2017-09-04 ENCOUNTER — Ambulatory Visit (INDEPENDENT_AMBULATORY_CARE_PROVIDER_SITE_OTHER): Payer: Medicare Other

## 2017-09-04 DIAGNOSIS — J309 Allergic rhinitis, unspecified: Secondary | ICD-10-CM | POA: Diagnosis not present

## 2017-09-07 ENCOUNTER — Other Ambulatory Visit: Payer: Self-pay | Admitting: Physician Assistant

## 2017-09-07 ENCOUNTER — Other Ambulatory Visit: Payer: Self-pay | Admitting: Allergy & Immunology

## 2017-09-12 ENCOUNTER — Ambulatory Visit (INDEPENDENT_AMBULATORY_CARE_PROVIDER_SITE_OTHER): Payer: Medicare Other | Admitting: *Deleted

## 2017-09-12 DIAGNOSIS — J309 Allergic rhinitis, unspecified: Secondary | ICD-10-CM

## 2017-09-15 ENCOUNTER — Other Ambulatory Visit: Payer: Self-pay | Admitting: Physician Assistant

## 2017-09-25 ENCOUNTER — Encounter: Payer: Self-pay | Admitting: Allergy & Immunology

## 2017-09-25 ENCOUNTER — Ambulatory Visit (INDEPENDENT_AMBULATORY_CARE_PROVIDER_SITE_OTHER): Payer: Medicare Other | Admitting: Allergy & Immunology

## 2017-09-25 VITALS — BP 140/60 | HR 68 | Resp 16

## 2017-09-25 DIAGNOSIS — J4540 Moderate persistent asthma, uncomplicated: Secondary | ICD-10-CM

## 2017-09-25 DIAGNOSIS — J3089 Other allergic rhinitis: Secondary | ICD-10-CM | POA: Diagnosis not present

## 2017-09-25 DIAGNOSIS — L2084 Intrinsic (allergic) eczema: Secondary | ICD-10-CM

## 2017-09-25 MED ORDER — PIMECROLIMUS 1 % EX CREA: TOPICAL_CREAM | Freq: Two times a day (BID) | CUTANEOUS | 3 refills | Status: DC | PRN

## 2017-09-25 MED ORDER — TACROLIMUS 0.1 % EX OINT: TOPICAL_OINTMENT | Freq: Two times a day (BID) | CUTANEOUS | 0 refills | Status: DC

## 2017-09-25 MED ORDER — CROMOLYN SODIUM 4 % OP SOLN: 1.0000 [drp] | Freq: Four times a day (QID) | OPHTHALMIC | 5 refills | Status: DC | PRN

## 2017-09-25 NOTE — Patient Instructions (Addendum)
1. Perennial allergic rhinitis (cat, dog, dust mite) - Continue with azelastine eye drops as needed.  - Add on Optichrom eye drops every 6 hours as needed.  - Continue with Xyzal (levocetirizine) 5mg  twice daily.  - Continue with allergy shots.   2. Moderate persistent asthma - Lung function looked stable today.  - Daily controller medication(s): Singulair 10mg  daily and Breo 200/3125mcg one puff once daily - Prior to physical activity: ProAir 2 puffs 10-15 minutes before physical activity. - Rescue medications: ProAir 4 puffs every 4-6 hours as needed - Asthma control goals:  * Full participation in all desired activities (may need albuterol before activity) * Albuterol use two time or less a week on average (not counting use with activity) * Cough interfering with sleep two time or less a month * Oral steroids no more than once a year    * No hospitalizations.   3. Eczema - Continue with your twice daily Xyzal to help control itching. - Continue triamcinolone 0.1% ointment twice daily as needed. - Continue with Elidel twice daily as needed.  - Please bring back the paper for Tammy so that she can start running the numbers for Dupixent. - Dupixent will help with your eczema and your asthma.   4. Return in about 4 months (around 01/26/2018).   Please inform us of any Emergency Department visits, hospitalizations, or changes in symptoms. Call us before going to the ED for breathing or allergy symptoms since we might be able to fit you in for a sick visit. Feel free to contact us anytime with any questions, problems, or concerns.  It was a pleasure to see you again today! Tell you husband we said hello!   Websites that have reliable patient information: 1. American Academy of Asthma, Allergy, and Immunology: www.aaaai.org 2. Food Allergy Research and Education (FARE): foodallergy.org 3. Mothers of Asthmatics: http://www.asthmacommunitynetwork.org 4. American College of Allergy,  Asthma, and Immunology: www.acaai.org

## 2017-09-25 NOTE — Progress Notes (Signed)
FOLLOW UP  Date of Service/Encounter:  09/25/17   Assessment:   Moderate persistent asthma without complication  Intrinsic atopic dermatitis  Perennial allergic rhinitis (dog, cat, dust mites)   Asthma Reportables:  Severity: moderate persistent  Risk: low Control: well controlled   Plan/Recommendations:   1. Perennial allergic rhinitis (cat, dog, dust mite) - Continue with azelastine eye drops as needed.  - Add on Optichrom eye drops every 6 hours as needed.  - Continue with Xyzal (levocetirizine) 5mg  twice daily.  - Continue with allergy shots.   2. Moderate persistent asthma - Lung function looked stable today.  - Daily controller medication(s): Singulair 10mg  daily and Breo 200/59mcg one puff once daily - Prior to physical activity: ProAir 2 puffs 10-15 minutes before physical activity. - Rescue medications: ProAir 4 puffs every 4-6 hours as needed - Asthma control goals:  * Full participation in all desired activities (may need albuterol before activity) * Albuterol use two time or less a week on average (not counting use with activity) * Cough interfering with sleep two time or less a month * Oral steroids no more than once a year    * No hospitalizations.   3. Eczema - Continue with your twice daily Xyzal to help control itching. - Continue triamcinolone 0.1% ointment twice daily as needed. - Continue with Elidel twice daily as needed.  - Please bring back the paper for Tammy so that she can start running the numbers for Dupixent. - Dupixent will help with your eczema and your asthma.   4. Return in about 4 months (around 01/26/2018).  Subjective:   Alexis [name] is a 72 y.o. female presenting today for follow up of  Chief Complaint  Patient presents with  . Asthma  . Itchy Eye  . sneezing    Alexis [name] has a history of the following: Patient Active Problem List   Diagnosis Date Noted  . Intrinsic atopic dermatitis 09/25/2017  .  Dermatitis 09/09/2016  . History of smoking greater than 50 pack years 08/24/2016  . Moderate persistent asthma, uncomplicated 08/24/2016  . Allergic rhinitis 06/11/2014  . Asthma with acute exacerbation 06/11/2014  . Extrinsic asthma 06/11/2014  . Hyperlipemia   . HTN (hypertension)   . Anxiety   . Hypothyroidism   . GERD (gastroesophageal reflux disease)   . Vitamin D deficiency   . Peripheral vascular disease with claudication (HCC) 07/19/2012  . Carotid artery disease (HCC) 07/19/2012  . Subclavian artery stenosis, left (HCC) 07/19/2012     History obtained from: chart review and patient.  Alexis [name]'s Primary Care Provider is Dorena Bodo, PA-C.     [name] is a 72 y.o. female presenting for a follow up visit.  [name] was last seen in March 2019.  At that time, we changed her to as a lasting eyedrops.  We continued her on her allergy shots and increase her Xyzal to 5 mg twice daily.  For her asthma, her lung function looked better.  We continued her on Breo 200/25 mcg 1 puff once daily as well as Singulair 10 mg daily.  Her eczema was fairly well controlled with Eucrisa twice daily.  We added on triamcinolone ointment to use twice daily as needed.  We did discuss the addition of Dupixent, but she has decided not to pursue this.  Since the last visit, she has done fairly well. She remains on her allergen immunotherapy, frozen at 0.2 mL of her Red Vial. She continues to sleep with her  cats, ut she tells me that they sometimes sleep in other places. She is on all of her medications including azelastine as well as Xyzal twice daily. The azelastine does sting, however, and she is open to other options for eye drops.   Her symptoms have worsened recently. She reports that it started when she ate some cake with ice cream. Evidently she has a history of allergy to cottonseed oil and oatmeal. She had a runny nose for two days. This occurred last week and she went htrough two boxes of  Kleenexes. She is feeling better, however.   Asthma is well controlled with Breo one puff once daily. [name]'s asthma has been well controlled. She has not required rescue medication, experienced nocturnal awakenings due to lower respiratory symptoms, nor have activities of daily living been limited. She has required no Emergency Department or Urgent Care visits for her asthma. She has required zero courses of systemic steroids for asthma exacerbations since the last visit. ACT score today is 21, indicating excellent asthma symptom control.   Her skin continues to be a problem. She is using Elidel as well as triamcinolone. She remains interested in Dupixent, but she reports that she did not receive any calls from [facility]. This is despite three phone calls reported in the EMR. In any case, she is willing to pursue this, but it all depends on the drug coverage.    [phone] (home) [phone] (cell is preferred)   Otherwise, there have been no changes to her past medical history, surgical history, family history, or social history.    Review of Systems: a 14-point review of systems is pertinent for what is mentioned in HPI.  Otherwise, all other systems were negative. Constitutional: negative other than that listed in the HPI Eyes: negative other than that listed in the HPI Ears, nose, mouth, throat, and face: negative other than that listed in the HPI Respiratory: negative other than that listed in the HPI Cardiovascular: negative other than that listed in the HPI Gastrointestinal: negative other than that listed in the HPI Genitourinary: negative other than that listed in the HPI Integument: negative other than that listed in the HPI Hematologic: negative other than that listed in the HPI Musculoskeletal: negative other than that listed in the HPI Neurological: negative other than that listed in the HPI Allergy/Immunologic: negative other than that listed in the HPI    Objective:    Blood pressure 140/60, pulse 68, resp. rate 16. There is no height or weight on file to calculate BMI.   Physical Exam:  General: Alert, interactive, in no acute distress. Pleasant female.  Eyes: No conjunctival injection bilaterally, no discharge on the right, no discharge on the left and no Horner-Trantas dots present. PERRL bilaterally. EOMI without pain. No photophobia. There is marked erythema around the bilateral eyes, but not as severe as it was when we first saw her.  Ears: Right TM pearly gray with normal light reflex, Left TM pearly gray with normal light reflex, Right TM intact without perforation and Left TM intact without perforation.  Nose/Throat: External nose within normal limits, nasal crease present and septum midline. Turbinates edematous with clear discharge. Posterior oropharynx erythematous without cobblestoning in the posterior oropharynx. Tonsils 2+ without exudates.  Tongue without thrush. Lungs: Clear to auscultation without wheezing, rhonchi or rales. No increased work of breathing. CV: Normal S1/S2. No murmurs. Capillary refill <2 seconds.  Skin: Warm and dry, without lesions or rashes. Neuro:   Grossly intact. No focal deficits appreciated.  Responsive to questions.  Diagnostic studies:   Spirometry: results abnormal (FEV1: 0.88/34%, FVC: 1.57/50%, FEV1/FVC: 56%).    Spirometry consistent with normal pattern.   Allergy Studies: none      Malachi BondsJoel Shayne Diguglielmo, MD  [facility]

## 2017-10-09 ENCOUNTER — Ambulatory Visit (INDEPENDENT_AMBULATORY_CARE_PROVIDER_SITE_OTHER): Payer: Medicare Other

## 2017-10-09 DIAGNOSIS — J309 Allergic rhinitis, unspecified: Secondary | ICD-10-CM | POA: Diagnosis not present

## 2017-10-17 ENCOUNTER — Other Ambulatory Visit: Payer: Self-pay | Admitting: Physician Assistant

## 2017-10-23 ENCOUNTER — Other Ambulatory Visit: Payer: Self-pay | Admitting: Allergy & Immunology

## 2017-10-23 ENCOUNTER — Ambulatory Visit (INDEPENDENT_AMBULATORY_CARE_PROVIDER_SITE_OTHER): Payer: Medicare Other | Admitting: *Deleted

## 2017-10-23 DIAGNOSIS — J309 Allergic rhinitis, unspecified: Secondary | ICD-10-CM | POA: Diagnosis not present

## 2017-11-03 ENCOUNTER — Ambulatory Visit (INDEPENDENT_AMBULATORY_CARE_PROVIDER_SITE_OTHER): Payer: Medicare Other

## 2017-11-03 DIAGNOSIS — J309 Allergic rhinitis, unspecified: Secondary | ICD-10-CM

## 2017-11-17 ENCOUNTER — Ambulatory Visit (INDEPENDENT_AMBULATORY_CARE_PROVIDER_SITE_OTHER): Payer: Medicare Other

## 2017-11-17 DIAGNOSIS — J309 Allergic rhinitis, unspecified: Secondary | ICD-10-CM | POA: Diagnosis not present

## 2017-11-21 ENCOUNTER — Other Ambulatory Visit: Payer: Self-pay | Admitting: Physician Assistant

## 2017-11-30 ENCOUNTER — Ambulatory Visit (INDEPENDENT_AMBULATORY_CARE_PROVIDER_SITE_OTHER): Payer: Medicare Other | Admitting: *Deleted

## 2017-11-30 DIAGNOSIS — J309 Allergic rhinitis, unspecified: Secondary | ICD-10-CM | POA: Diagnosis not present

## 2017-12-07 ENCOUNTER — Other Ambulatory Visit: Payer: Self-pay | Admitting: Physician Assistant

## 2017-12-12 ENCOUNTER — Ambulatory Visit (INDEPENDENT_AMBULATORY_CARE_PROVIDER_SITE_OTHER): Payer: Medicare Other | Admitting: *Deleted

## 2017-12-12 DIAGNOSIS — J309 Allergic rhinitis, unspecified: Secondary | ICD-10-CM | POA: Diagnosis not present

## 2017-12-21 ENCOUNTER — Other Ambulatory Visit: Payer: Self-pay | Admitting: Physician Assistant

## 2017-12-21 ENCOUNTER — Ambulatory Visit: Payer: Self-pay

## 2017-12-21 ENCOUNTER — Telehealth: Payer: Self-pay

## 2017-12-21 NOTE — Telephone Encounter (Signed)
Patient came in for her injection today. She is having a severe eczema flair which has actually caused loss of sleep. She is itching all over and has visible red patches all over. I denied injection because I did not want to exacerbate symptoms. She is taking all medications as prescribed. I recommended she call us Monday if still no better. Do you have any further recommendations to help with her symptoms?

## 2017-12-26 ENCOUNTER — Ambulatory Visit (INDEPENDENT_AMBULATORY_CARE_PROVIDER_SITE_OTHER): Payer: Medicare Other | Admitting: *Deleted

## 2017-12-26 ENCOUNTER — Other Ambulatory Visit: Payer: Self-pay | Admitting: *Deleted

## 2017-12-26 DIAGNOSIS — J309 Allergic rhinitis, unspecified: Secondary | ICD-10-CM

## 2017-12-26 MED ORDER — PREDNISONE 10 MG PO TABS: 10.0000 mg | ORAL_TABLET | Freq: Two times a day (BID) | ORAL | 0 refills | Status: AC

## 2017-12-26 NOTE — Telephone Encounter (Signed)
She has been doing so well. Let's call her and send in prednisone 10mg  BID for five days.  Malachi Bonds, MD [facility]

## 2017-12-26 NOTE — Telephone Encounter (Signed)
Prednisone has been sent in to the [facility] in [place]. Called and left a voicemail for the patient asking to return call to inform the patient of the medication and to see how the patient is doing.

## 2017-12-28 NOTE — Telephone Encounter (Signed)
Called and left message for patient to call office in regards to this matter. 

## 2017-12-29 NOTE — Telephone Encounter (Signed)
Patient came into office and her skin was much better. She may not need the Prednisone anymore.

## 2017-12-29 NOTE — Telephone Encounter (Signed)
That is great to hear.  She probably needs to stop loving on her cats.  Malachi Bonds, MD [facility]

## 2018-01-01 ENCOUNTER — Other Ambulatory Visit: Payer: Self-pay | Admitting: Physician Assistant

## 2018-01-08 ENCOUNTER — Ambulatory Visit (INDEPENDENT_AMBULATORY_CARE_PROVIDER_SITE_OTHER): Payer: Medicare Other | Admitting: *Deleted

## 2018-01-08 DIAGNOSIS — J309 Allergic rhinitis, unspecified: Secondary | ICD-10-CM | POA: Diagnosis not present

## 2018-01-22 ENCOUNTER — Ambulatory Visit (INDEPENDENT_AMBULATORY_CARE_PROVIDER_SITE_OTHER): Payer: Medicare Other | Admitting: *Deleted

## 2018-01-22 DIAGNOSIS — J309 Allergic rhinitis, unspecified: Secondary | ICD-10-CM

## 2018-02-02 ENCOUNTER — Other Ambulatory Visit: Payer: Self-pay | Admitting: Family Medicine

## 2018-02-07 ENCOUNTER — Telehealth: Payer: Self-pay | Admitting: *Deleted

## 2018-02-07 NOTE — Telephone Encounter (Signed)
Sure let send in prednisone 20 mg twice a day for 5 days.  I want her to get behind, so she can certainly come for a shot tomorrow or Friday.  Malachi BondsJoel Jeannine Pennisi, MD [facility]

## 2018-02-07 NOTE — Telephone Encounter (Signed)
Patient called stating she is having a severe eczema flare again. She called Monday and was told not to come in and get a shot due to her itching. She tried to put some lotion on her skin yesterday to help soothe the itch but it just set her skin on fire. Can you give her anything to help relieve the itching? Can patient come in for her allergy shot? Please advise.

## 2018-02-08 ENCOUNTER — Ambulatory Visit (INDEPENDENT_AMBULATORY_CARE_PROVIDER_SITE_OTHER): Payer: Medicare Other | Admitting: *Deleted

## 2018-02-08 DIAGNOSIS — J309 Allergic rhinitis, unspecified: Secondary | ICD-10-CM

## 2018-02-08 NOTE — Telephone Encounter (Signed)
Informed patient it was okay for her to come in for her allergy injection and we will provide a little steroid to help with the itching. Patient agreed with plan.

## 2018-02-27 ENCOUNTER — Ambulatory Visit (INDEPENDENT_AMBULATORY_CARE_PROVIDER_SITE_OTHER): Payer: Medicare Other | Admitting: *Deleted

## 2018-02-27 DIAGNOSIS — J309 Allergic rhinitis, unspecified: Secondary | ICD-10-CM | POA: Diagnosis not present

## 2018-03-09 ENCOUNTER — Ambulatory Visit (INDEPENDENT_AMBULATORY_CARE_PROVIDER_SITE_OTHER): Payer: Medicare Other | Admitting: *Deleted

## 2018-03-09 DIAGNOSIS — J309 Allergic rhinitis, unspecified: Secondary | ICD-10-CM

## 2018-03-16 ENCOUNTER — Other Ambulatory Visit: Payer: Self-pay | Admitting: Family Medicine

## 2018-03-19 ENCOUNTER — Other Ambulatory Visit: Payer: Self-pay

## 2018-03-19 MED ORDER — LEVOTHYROXINE SODIUM 75 MCG PO TABS: 75.0000 ug | ORAL_TABLET | Freq: Every day | ORAL | 1 refills | Status: DC

## 2018-04-03 ENCOUNTER — Ambulatory Visit (INDEPENDENT_AMBULATORY_CARE_PROVIDER_SITE_OTHER): Payer: Medicare Other

## 2018-04-03 DIAGNOSIS — J309 Allergic rhinitis, unspecified: Secondary | ICD-10-CM | POA: Diagnosis not present

## 2018-04-17 ENCOUNTER — Other Ambulatory Visit: Payer: Self-pay | Admitting: Family Medicine

## 2018-04-19 ENCOUNTER — Other Ambulatory Visit: Payer: Self-pay | Admitting: *Deleted

## 2018-04-19 DIAGNOSIS — K219 Gastro-esophageal reflux disease without esophagitis: Secondary | ICD-10-CM

## 2018-04-19 MED ORDER — PANTOPRAZOLE SODIUM 40 MG PO TBEC: 40.0000 mg | DELAYED_RELEASE_TABLET | Freq: Every day | ORAL | 0 refills | Status: DC

## 2018-04-30 ENCOUNTER — Ambulatory Visit (INDEPENDENT_AMBULATORY_CARE_PROVIDER_SITE_OTHER): Payer: Medicare Other

## 2018-04-30 DIAGNOSIS — J309 Allergic rhinitis, unspecified: Secondary | ICD-10-CM

## 2018-05-07 ENCOUNTER — Ambulatory Visit (INDEPENDENT_AMBULATORY_CARE_PROVIDER_SITE_OTHER): Payer: Medicare Other

## 2018-05-07 DIAGNOSIS — J309 Allergic rhinitis, unspecified: Secondary | ICD-10-CM | POA: Diagnosis not present

## 2018-05-28 ENCOUNTER — Other Ambulatory Visit: Payer: Self-pay | Admitting: Family Medicine

## 2018-07-02 ENCOUNTER — Other Ambulatory Visit: Payer: Self-pay | Admitting: Family Medicine

## 2018-08-14 ENCOUNTER — Other Ambulatory Visit: Payer: Self-pay | Admitting: Family Medicine

## 2018-08-17 ENCOUNTER — Ambulatory Visit (INDEPENDENT_AMBULATORY_CARE_PROVIDER_SITE_OTHER): Payer: Medicare Other | Admitting: Family Medicine

## 2018-08-17 ENCOUNTER — Encounter: Payer: Self-pay | Admitting: Family Medicine

## 2018-08-17 ENCOUNTER — Other Ambulatory Visit: Payer: Self-pay

## 2018-08-17 VITALS — BP 142/68 | HR 82 | Temp 98.1°F | Resp 16 | Ht 59.0 in | Wt 118.0 lb

## 2018-08-17 DIAGNOSIS — J4520 Mild intermittent asthma, uncomplicated: Secondary | ICD-10-CM

## 2018-08-17 DIAGNOSIS — I739 Peripheral vascular disease, unspecified: Secondary | ICD-10-CM | POA: Diagnosis not present

## 2018-08-17 DIAGNOSIS — I779 Disorder of arteries and arterioles, unspecified: Secondary | ICD-10-CM

## 2018-08-17 DIAGNOSIS — I10 Essential (primary) hypertension: Secondary | ICD-10-CM

## 2018-08-17 DIAGNOSIS — K219 Gastro-esophageal reflux disease without esophagitis: Secondary | ICD-10-CM | POA: Diagnosis not present

## 2018-08-17 DIAGNOSIS — E785 Hyperlipidemia, unspecified: Secondary | ICD-10-CM | POA: Diagnosis not present

## 2018-08-17 DIAGNOSIS — F419 Anxiety disorder, unspecified: Secondary | ICD-10-CM | POA: Diagnosis not present

## 2018-08-17 DIAGNOSIS — E039 Hypothyroidism, unspecified: Secondary | ICD-10-CM

## 2018-08-17 DIAGNOSIS — L2084 Intrinsic (allergic) eczema: Secondary | ICD-10-CM

## 2018-08-17 MED ORDER — BENAZEPRIL HCL 40 MG PO TABS: 40.0000 mg | ORAL_TABLET | Freq: Every day | ORAL | 1 refills | Status: DC

## 2018-08-17 MED ORDER — PREDNISONE 10 MG PO TABS: ORAL_TABLET | ORAL | 0 refills | Status: DC

## 2018-08-17 MED ORDER — LEVOTHYROXINE SODIUM 75 MCG PO TABS: 75.0000 ug | ORAL_TABLET | Freq: Every day | ORAL | 1 refills | Status: DC

## 2018-08-17 MED ORDER — PANTOPRAZOLE SODIUM 40 MG PO TBEC: 40.0000 mg | DELAYED_RELEASE_TABLET | Freq: Every day | ORAL | 0 refills | Status: DC

## 2018-08-17 MED ORDER — CITALOPRAM HYDROBROMIDE 40 MG PO TABS: 40.0000 mg | ORAL_TABLET | Freq: Every day | ORAL | 1 refills | Status: DC

## 2018-08-17 MED ORDER — MONTELUKAST SODIUM 10 MG PO TABS: 10.0000 mg | ORAL_TABLET | Freq: Every day | ORAL | 1 refills | Status: DC

## 2018-08-17 MED ORDER — ALBUTEROL SULFATE HFA 108 (90 BASE) MCG/ACT IN AERS: INHALATION_SPRAY | RESPIRATORY_TRACT | 1 refills | Status: DC

## 2018-08-17 NOTE — Assessment & Plan Note (Signed)
Currently mild intermittent, using SABA rarely, but BREO sample given for when she needs maintenance inhaler

## 2018-08-17 NOTE — Assessment & Plan Note (Signed)
Severely uncontrolled, current eczema flare requiring oral steroids, discussed management with hydration, avoiding triggers/allergens, avoid dying soaps - handout given Most of maintenance she does not want to do based off her preferences to avoid ointments, vaseline, creams

## 2018-08-17 NOTE — Assessment & Plan Note (Signed)
Out of meds, BP elevated today, restart ACEI, she wants to hold other meds to see if BP managed with one (or less) meds Monitor BP, f/up in 2 weeks  Goal < 130/80 Check labs today

## 2018-08-17 NOTE — Assessment & Plan Note (Signed)
(  pt new to me, basic "anxiety" I suspect is not an adequate dx code) Off meds for a few months, only notes some agitation Refill celexa at prior dose

## 2018-08-17 NOTE — Assessment & Plan Note (Signed)
Refill of PPI

## 2018-08-17 NOTE — Patient Instructions (Signed)
4 month follow up appt   Take steroids and hydrate your skin  Resume taking all your medications and follow up sooner than next appointment if you are having any problems with your blood pressure, moods, skin, asthma, etc.   Eczema Eczema is a broad term for a group of skin conditions that cause skin to become rough and inflamed. Each type of eczema has different triggers, symptoms, and treatments. Eczema of any type is usually itchy and symptoms range from mild to severe. Eczema and its symptoms are not spread from person to person (are not contagious). It can appear on different parts of the body at different times. Your eczema may not look the same as someone else's eczema. What are the types of eczema? Atopic dermatitis This is a long-term (chronic) skin disease that keeps coming back (recurring). Usual symptoms are dry skin and small, solid pimples that may swell and leak fluid (weep). Contact dermatitis  This happens when something irritates the skin and causes a rash. The irritation can come from substances that you are allergic to (allergens), such as poison ivy, chemicals, or medicines that were applied to your skin. Dyshidrotic eczema This is a form of eczema on the hands and feet. It shows up as very itchy, fluid-filled blisters. It can affect people of any age, but is more common before age 340. Hand eczema  This causes very itchy areas of skin on the palms and sides of the hands and fingers. This type of eczema is common in industrial jobs where you may be exposed to many different types of irritants. Lichen simplex chronicus This type of eczema occurs when a person constantly scratches one area of the body. Repeated scratching of the area leads to thickened skin (lichenification). Lichen simplex chronicus can occur along with other types of eczema. It is more common in adults, but may be seen in children as well. Nummular eczema This is a common type of eczema. It has no known  cause. It typically causes a red, circular, crusty lesion (plaque) that may be itchy. Scratching may become a habit and can cause bleeding. Nummular eczema occurs most often in people of middle-age or older. It most often affects the hands. Seborrheic dermatitis This is a common skin disease that mainly affects the scalp. It may also affect any oily areas of the body, such as the face, sides of nose, eyebrows, ears, eyelids, and chest. It is marked by small scaling and redness of the skin (erythema). This can affect people of all ages. In infants, this condition is known as Location manager"cradle cap." Stasis dermatitis This is a common skin disease that usually appears on the legs and feet. It most often occurs in people who have a condition that prevents blood from being pumped through the veins in the legs (chronic venous insufficiency). Stasis dermatitis is a chronic condition that needs long-term management. How is eczema diagnosed? Your health care provider will examine your skin and review your medical history. He or she may also give you skin patch tests. These tests involve taking patches that contain possible allergens and placing them on your back. He or she will then check in a few days to see if an allergic reaction occurred. What are the common treatments? Treatment for eczema is based on the type of eczema you have. Hydrocortisone steroid medicine can relieve itching quickly and help reduce inflammation. This medicine may be prescribed or obtained over-the-counter, depending on the strength of the medicine that is needed. Follow  these instructions at home:  Take over-the-counter and prescription medicines only as told by your health care provider.  Use creams or ointments to moisturize your skin. Do not use lotions.  Learn what triggers or irritates your symptoms. Avoid these things.  Treat symptom flare-ups quickly.  Do not itch your skin. This can make your rash worse.  Keep all follow-up  visits as told by your health care provider. This is important. Where to find more information  The American Academy of Dermatology: http://jones-macias.info/  The National Eczema Association: www.nationaleczema.org Contact a health care provider if:  You have serious itching, even with treatment.  You regularly scratch your skin until it bleeds.  Your rash looks different than usual.  Your skin is painful, swollen, or more red than usual.  You have a fever. Summary  There are eight general types of eczema. Each type has different triggers.  Eczema of any type causes itching that may range from mild to severe.  Treatment varies based on the type of eczema you have. Hydrocortisone steroid medicine can help with itching and inflammation.  Protecting your skin is the best way to prevent eczema. Use moisturizers and lotions. Avoid triggers and irritants, and treat flare-ups quickly. This information is not intended to replace advice given to you by your health care provider. Make sure you discuss any questions you have with your health care provider. Document Released: 06/30/2016 Document Revised: 06/30/2016 Document Reviewed: 06/30/2016 Elsevier Interactive Patient Education  2019 [employer].

## 2018-08-17 NOTE — Assessment & Plan Note (Signed)
Off levothyroxine, notes some weight gain and dry skin - possibly some cognitive difficulty, will restart prior dose, TSH today and would need repeat in 6 weeks to make sure same dose is appropriate

## 2018-08-17 NOTE — Progress Notes (Signed)
Patient ID: Alexis [name], female    DOB: 02-Nov-1945, 73 y.o.   MRN: [id number]  PCP: Danelle Berryapia, Joshiah Traynham, PA-C     Chief Complaint  Patient presents with  . Medication Review/ refill    celexa    Subjective:   Alexis Paulsatricia [name] is a 73 y.o. female, presents to clinic with CC of med refills, she previously saw Frazier RichardsMary Beth Dixon PA-C as her PCP, but has not come in to establish with new provider.  She comes in today due to being out of several medications for a few months.  Thyroid - on 75 mcg, out for at least 1-2 months.  She says she has gained weight, and skin is dry.  She denies any other sx to me today, but staff did reports she called a few times and was confused about the appointment she just made, she called again 15 min later to make an appointment. HTN - on bystolic and Lotensin but it is also out of his medications.  She states that she used to be on a another blood pressure medicine at the same time and when being seen at other offices sometimes her blood pressure was extremely low in the 80s systolic.  Today her blood pressure is elevated, she denies any symptoms of chest pain, palpitations, exertional angina or dyspnea, near syncopal episodes.  She does not want to resume multiple blood pressure medications if she does not have to. She is also on a low-dose statin although the chart does have medical history of peripheral artery disease and carotid artery disease.  She is fasting today  Breo - she could tell a difference when taking but it is too expensive right now.  No having wheeze or SOB right now, rarely using albuterol inhaler, not on maintenance right now, will need in other seasons.  Exertion and hot humid weather (also fall and winter season) asthma is worse.  celexa - off it as well - feels agitated without it  Skin is itching and eczema is severe- she refuses to use vaseline or creams only likes lotions and sometimes when itching is severe uses alcohol wipes to skin.  Rash  and itching is covering most of her body - extremities lower and upper are the worst, more mild to torso, none to face.  Otherwise pt refuses to address annual exam/MWV or other care gaps/health maintenance.    Patient Active Problem List   Diagnosis Date Noted  . Intrinsic atopic dermatitis 09/25/2017  . Dermatitis 09/09/2016  . History of smoking greater than 50 pack years 08/24/2016  . Moderate persistent asthma, uncomplicated 08/24/2016  . Allergic rhinitis 06/11/2014  . Asthma with acute exacerbation 06/11/2014  . Extrinsic asthma 06/11/2014  . Hyperlipemia   . HTN (hypertension)   . Anxiety   . Hypothyroidism   . GERD (gastroesophageal reflux disease)   . Vitamin D deficiency   . Peripheral vascular disease with claudication (HCC) 07/19/2012  . Carotid artery disease (HCC) 07/19/2012  . Subclavian artery stenosis, left (HCC) 07/19/2012    Current Meds  Medication Sig  . albuterol (VENTOLIN HFA) 108 (90 Base) MCG/ACT inhaler INHALE 2 PUFFS INTO THE LUNGS EVERY 6 (SIX) HOURS AS NEEDED FOR WHEEZING  . aspirin 81 MG tablet Take 81 mg by mouth daily.  Alexis Kitchen. atorvastatin (LIPITOR) 10 MG tablet TAKE 1 TABLET BY MOUTH ONCE DAILY  . azelastine (OPTIVAR) 0.05 % ophthalmic solution Place 1 drop into both eyes 2 (two) times daily.  . cholecalciferol (VITAMIN D)  1000 UNITS tablet Take 2,000 Units by mouth daily.  . citalopram (CELEXA) 40 MG tablet TAKE 1 TABLET BY MOUTH ONCE DAILY  . cromolyn (OPTICROM) 4 % ophthalmic solution Place 1 drop into both eyes 4 (four) times daily as needed.  . diphenhydrAMINE (BENADRYL) 25 mg capsule Take 25 mg by mouth. 2 tabs at night  . montelukast (SINGULAIR) 10 MG tablet TAKE 1 TABLET BY MOUTH DAILY AT BEDTIME  . [DISCONTINUED] pimecrolimus (ELIDEL) 1 % cream Apply topically 2 (two) times daily as needed.  . [DISCONTINUED] tacrolimus (PROTOPIC) 0.1 % ointment Apply topically 2 (two) times daily.  . [DISCONTINUED] triamcinolone ointment (KENALOG) 0.1 % Apply  1 application topically 2 (two) times daily.     Review of Systems  Constitutional: Negative.  Negative for activity change, appetite change, fatigue and unexpected weight change.  HENT: Negative.   Eyes: Negative.   Respiratory: Negative.  Negative for shortness of breath and wheezing.   Cardiovascular: Negative.  Negative for chest pain, palpitations and leg swelling.  Gastrointestinal: Negative.  Negative for abdominal pain and blood in stool.  Endocrine: Negative.   Genitourinary: Negative.   Musculoskeletal: Negative.  Negative for arthralgias, gait problem, joint swelling and myalgias.  Skin: Positive for rash.  Allergic/Immunologic: Negative.   Neurological: Negative.  Negative for syncope and weakness.  Hematological: Negative.   Psychiatric/Behavioral: Negative.  Negative for confusion, dysphoric mood, self-injury, sleep disturbance and suicidal ideas. The patient is not nervous/anxious.   All other systems reviewed and are negative.      Objective:    Vitals:   08/17/18 1204  BP: (!) 142/68  Pulse: 82  Resp: 16  Temp: 98.1 F (36.7 C)  TempSrc: Oral  SpO2: 98%  Weight: 118 lb (53.5 kg)  Height: 4\' 11"  (1.499 m)      Physical Exam Vitals signs and nursing note reviewed.  Constitutional:      Appearance: Normal appearance. She is well-developed. She is not ill-appearing, toxic-appearing or diaphoretic.     Interventions: Face mask in place.     Comments: Thin well appearing elderly female  HENT:     Head: Normocephalic and atraumatic.     Right Ear: External ear normal.     Left Ear: External ear normal.     Mouth/Throat:     Pharynx: Uvula midline.  Eyes:     General: Lids are normal.     Conjunctiva/sclera: Conjunctivae normal.     Pupils: Pupils are equal, round, and reactive to light.  Neck:     Musculoskeletal: Normal range of motion and neck supple.     Trachea: Phonation normal. No tracheal deviation.  Cardiovascular:     Rate and Rhythm:  Normal rate and regular rhythm.     Pulses: Normal pulses.          Radial pulses are 2+ on the right side and 2+ on the left side.       Posterior tibial pulses are 2+ on the right side and 2+ on the left side.     Heart sounds: Normal heart sounds. No murmur. No friction rub. No gallop.   Pulmonary:     Effort: Pulmonary effort is normal. No respiratory distress.     Breath sounds: Normal breath sounds. No stridor. No wheezing, rhonchi or rales.  Abdominal:     General: Bowel sounds are normal. There is no distension.     Palpations: Abdomen is soft.     Tenderness: There is no abdominal tenderness. There  is no guarding or rebound.  Musculoskeletal: Normal range of motion.        General: No deformity.  Lymphadenopathy:     Cervical: No cervical adenopathy.  Skin:    General: Skin is warm and dry.     Capillary Refill: Capillary refill takes less than 2 seconds.     Coloration: Skin is not pale.     Findings: Rash present.  Neurological:     Mental Status: She is alert and oriented to person, place, and time.     Motor: No abnormal muscle tone.     Coordination: Coordination normal.     Gait: Gait normal.  Psychiatric:        Attention and Perception: Attention normal.        Mood and Affect: Mood normal.        Speech: Speech normal.        Behavior: Behavior is cooperative.        Thought Content: Thought content normal.        Judgment: Judgment normal.           Assessment & Plan:   73 y/o white female, presents with need for multiple med refills, her PCP left practice and pt reluctantly came in today to establish care again.  Problem List Items Addressed This Visit      Cardiovascular and Mediastinum   Carotid artery disease (HCC)   Relevant Medications   benazepril (LOTENSIN) 40 MG tablet   Other Relevant Orders   Lipid panel   HTN (hypertension) - Primary    Out of meds, BP elevated today, restart ACEI, she wants to hold other meds to see if BP managed  with one (or less) meds Monitor BP, f/up in 2 weeks  Goal < 130/80 Check labs today      Relevant Medications   benazepril (LOTENSIN) 40 MG tablet   Other Relevant Orders   COMPLETE METABOLIC PANEL WITH GFR     Respiratory   Extrinsic asthma    Currently mild intermittent, using SABA rarely, but BREO sample given for when she needs maintenance inhaler      Relevant Medications   montelukast (SINGULAIR) 10 MG tablet   albuterol (VENTOLIN HFA) 108 (90 Base) MCG/ACT inhaler   predniSONE (DELTASONE) 10 MG tablet     Digestive   GERD (gastroesophageal reflux disease)    Refill of PPI      Relevant Medications   pantoprazole (PROTONIX) 40 MG tablet     Endocrine   Hypothyroidism    Off levothyroxine, notes some weight gain and dry skin - possibly some cognitive difficulty, will restart prior dose, TSH today and would need repeat in 6 weeks to make sure same dose is appropriate      Relevant Medications   levothyroxine (SYNTHROID) 75 MCG tablet   Other Relevant Orders   TSH   T4, free     Musculoskeletal and Integument   Intrinsic atopic dermatitis    Severely uncontrolled, current eczema flare requiring oral steroids, discussed management with hydration, avoiding triggers/allergens, avoid dying soaps - handout given Most of maintenance she does not want to do based off her preferences to avoid ointments, vaseline, creams      Relevant Medications   montelukast (SINGULAIR) 10 MG tablet   predniSONE (DELTASONE) 10 MG tablet     Other   Hyperlipemia    Intermittent compliance with meds, does have some "left over" CMP and FLP today Will wait to refill until labs  result - she is on low dose, but med-high intensity statin is likely required With other health dx encouraged excellent compliance to reduce risk of MI/stroke or worsening atherosclerotic disease      Relevant Medications   benazepril (LOTENSIN) 40 MG tablet   Other Relevant Orders   COMPLETE METABOLIC PANEL  WITH GFR   CBC with Differential/Platelet   Lipid panel   Anxiety    (pt new to me, basic "anxiety" I suspect is not an adequate dx code) Off meds for a few months, only notes some agitation Refill celexa at prior dose      Relevant Medications   citalopram (CELEXA) 40 MG tablet      Would like 3-4 month f/up, but pt declined to make a f/up appt    Danelle BerryLeisa Abeer Iversen, PA-C 08/17/18 12:16 PM

## 2018-08-17 NOTE — Assessment & Plan Note (Addendum)
Intermittent compliance with meds, does have some "left over" CMP and FLP today Will wait to refill until labs result - she is on low dose, but med-high intensity statin is likely required With other health dx encouraged excellent compliance to reduce risk of MI/stroke or worsening atherosclerotic disease

## 2018-08-18 LAB — CBC WITH DIFFERENTIAL/PLATELET
Absolute Monocytes: 572 cells/uL (ref 200–950)
Basophils Absolute: 50 cells/uL (ref 0–200)
Basophils Relative: 0.9 %
Eosinophils Absolute: 413 cells/uL (ref 15–500)
Eosinophils Relative: 7.5 %
HCT: 39.3 % (ref 35.0–45.0)
Hemoglobin: 12.9 g/dL (ref 11.7–15.5)
Lymphs Abs: 1430 cells/uL (ref 850–3900)
MCH: 31.1 pg (ref 27.0–33.0)
MCHC: 32.8 g/dL (ref 32.0–36.0)
MCV: 94.7 fL (ref 80.0–100.0)
MPV: 11.3 fL (ref 7.5–12.5)
Monocytes Relative: 10.4 %
Neutro Abs: 3036 cells/uL (ref 1500–7800)
Neutrophils Relative %: 55.2 %
Platelets: 273 10*3/uL (ref 140–400)
RBC: 4.15 10*6/uL (ref 3.80–5.10)
RDW: 13.8 % (ref 11.0–15.0)
Total Lymphocyte: 26 %
WBC: 5.5 10*3/uL (ref 3.8–10.8)

## 2018-08-18 LAB — COMPLETE METABOLIC PANEL WITH GFR
AG Ratio: 1.8 (calc) (ref 1.0–2.5)
ALT: 9 U/L (ref 6–29)
AST: 21 U/L (ref 10–35)
Albumin: 4.4 g/dL (ref 3.6–5.1)
Alkaline phosphatase (APISO): 74 U/L (ref 37–153)
BUN: 12 mg/dL (ref 7–25)
CO2: 27 mmol/L (ref 20–32)
Calcium: 9.6 mg/dL (ref 8.6–10.4)
Chloride: 100 mmol/L (ref 98–110)
Creat: 0.91 mg/dL (ref 0.60–0.93)
GFR, Est African American: 72 mL/min/{1.73_m2} (ref >=60)
GFR, Est Non African American: 62 mL/min/{1.73_m2} (ref >=60)
Globulin: 2.4 g/dL (calc) (ref 1.9–3.7)
Glucose, Bld: 77 mg/dL (ref 65–99)
Potassium: 4.4 mmol/L (ref 3.5–5.3)
Sodium: 141 mmol/L (ref 135–146)
Total Bilirubin: 0.5 mg/dL (ref 0.2–1.2)
Total Protein: 6.8 g/dL (ref 6.1–8.1)

## 2018-08-18 LAB — LIPID PANEL
Cholesterol: 230 mg/dL — ABNORMAL HIGH (ref <200)
HDL: 58 mg/dL (ref >=50)
LDL Cholesterol (Calc): 138 mg/dL (calc) — ABNORMAL HIGH
Non-HDL Cholesterol (Calc): 172 mg/dL (calc) — ABNORMAL HIGH (ref <130)
Total CHOL/HDL Ratio: 4 (calc) (ref <5.0)
Triglycerides: 202 mg/dL — ABNORMAL HIGH (ref <150)

## 2018-08-18 LAB — T4, FREE: Free T4: 0.6 ng/dL — ABNORMAL LOW (ref 0.8–1.8)

## 2018-08-18 LAB — TSH: TSH: 84.4 mIU/L — ABNORMAL HIGH (ref 0.40–4.50)

## 2018-08-21 ENCOUNTER — Other Ambulatory Visit: Payer: Self-pay | Admitting: Family Medicine

## 2018-08-21 MED ORDER — ATORVASTATIN CALCIUM 20 MG PO TABS: 20.0000 mg | ORAL_TABLET | Freq: Every day | ORAL | 1 refills | Status: DC

## 2018-09-19 ENCOUNTER — Other Ambulatory Visit: Payer: Self-pay | Admitting: Family Medicine

## 2018-09-28 ENCOUNTER — Other Ambulatory Visit: Payer: Self-pay

## 2019-01-23 ENCOUNTER — Other Ambulatory Visit: Payer: Self-pay | Admitting: Family Medicine

## 2019-01-23 DIAGNOSIS — E039 Hypothyroidism, unspecified: Secondary | ICD-10-CM

## 2019-01-23 DIAGNOSIS — J4520 Mild intermittent asthma, uncomplicated: Secondary | ICD-10-CM

## 2019-01-23 DIAGNOSIS — L2084 Intrinsic (allergic) eczema: Secondary | ICD-10-CM

## 2019-01-23 DIAGNOSIS — I10 Essential (primary) hypertension: Secondary | ICD-10-CM

## 2019-01-23 DIAGNOSIS — F419 Anxiety disorder, unspecified: Secondary | ICD-10-CM

## 2019-01-29 ENCOUNTER — Other Ambulatory Visit: Payer: Self-pay | Admitting: Family Medicine

## 2019-01-29 DIAGNOSIS — J4520 Mild intermittent asthma, uncomplicated: Secondary | ICD-10-CM

## 2019-01-29 DIAGNOSIS — E039 Hypothyroidism, unspecified: Secondary | ICD-10-CM

## 2019-01-29 DIAGNOSIS — L2084 Intrinsic (allergic) eczema: Secondary | ICD-10-CM

## 2019-02-12 ENCOUNTER — Other Ambulatory Visit: Payer: Self-pay | Admitting: Family Medicine

## 2019-02-12 DIAGNOSIS — I10 Essential (primary) hypertension: Secondary | ICD-10-CM

## 2019-02-12 DIAGNOSIS — F419 Anxiety disorder, unspecified: Secondary | ICD-10-CM

## 2019-03-13 ENCOUNTER — Other Ambulatory Visit: Payer: Self-pay | Admitting: Family Medicine

## 2019-03-13 DIAGNOSIS — F419 Anxiety disorder, unspecified: Secondary | ICD-10-CM

## 2019-03-15 ENCOUNTER — Other Ambulatory Visit: Payer: Self-pay | Admitting: Family Medicine

## 2019-03-15 DIAGNOSIS — I10 Essential (primary) hypertension: Secondary | ICD-10-CM

## 2019-03-15 DIAGNOSIS — F419 Anxiety disorder, unspecified: Secondary | ICD-10-CM

## 2019-04-24 ENCOUNTER — Other Ambulatory Visit: Payer: Self-pay | Admitting: Family Medicine

## 2019-04-24 DIAGNOSIS — F419 Anxiety disorder, unspecified: Secondary | ICD-10-CM

## 2019-04-24 DIAGNOSIS — I10 Essential (primary) hypertension: Secondary | ICD-10-CM

## 2019-04-30 ENCOUNTER — Ambulatory Visit (INDEPENDENT_AMBULATORY_CARE_PROVIDER_SITE_OTHER): Payer: Medicare Other | Admitting: Nurse Practitioner

## 2019-04-30 ENCOUNTER — Other Ambulatory Visit: Payer: Self-pay

## 2019-04-30 VITALS — BP 150/80 | HR 97 | Temp 98.5°F | Resp 18 | Ht 59.0 in | Wt 102.8 lb

## 2019-04-30 DIAGNOSIS — I10 Essential (primary) hypertension: Secondary | ICD-10-CM

## 2019-04-30 DIAGNOSIS — I779 Disorder of arteries and arterioles, unspecified: Secondary | ICD-10-CM

## 2019-04-30 DIAGNOSIS — J45901 Unspecified asthma with (acute) exacerbation: Secondary | ICD-10-CM

## 2019-04-30 DIAGNOSIS — E785 Hyperlipidemia, unspecified: Secondary | ICD-10-CM

## 2019-04-30 DIAGNOSIS — F419 Anxiety disorder, unspecified: Secondary | ICD-10-CM

## 2019-04-30 DIAGNOSIS — Z79899 Other long term (current) drug therapy: Secondary | ICD-10-CM | POA: Diagnosis not present

## 2019-04-30 DIAGNOSIS — I499 Cardiac arrhythmia, unspecified: Secondary | ICD-10-CM | POA: Diagnosis not present

## 2019-04-30 DIAGNOSIS — R42 Dizziness and giddiness: Secondary | ICD-10-CM

## 2019-04-30 DIAGNOSIS — E039 Hypothyroidism, unspecified: Secondary | ICD-10-CM | POA: Diagnosis not present

## 2019-04-30 DIAGNOSIS — L2084 Intrinsic (allergic) eczema: Secondary | ICD-10-CM

## 2019-04-30 DIAGNOSIS — J4520 Mild intermittent asthma, uncomplicated: Secondary | ICD-10-CM

## 2019-04-30 DIAGNOSIS — R0989 Other specified symptoms and signs involving the circulatory and respiratory systems: Secondary | ICD-10-CM

## 2019-04-30 MED ORDER — MONTELUKAST SODIUM 10 MG PO TABS: 10.0000 mg | ORAL_TABLET | Freq: Every day | ORAL | 7 refills | Status: DC

## 2019-04-30 MED ORDER — ALBUTEROL SULFATE HFA 108 (90 BASE) MCG/ACT IN AERS: INHALATION_SPRAY | RESPIRATORY_TRACT | 11 refills | Status: DC

## 2019-04-30 MED ORDER — BENAZEPRIL HCL 40 MG PO TABS: ORAL_TABLET | ORAL | 7 refills | Status: DC

## 2019-04-30 MED ORDER — CITALOPRAM HYDROBROMIDE 40 MG PO TABS: ORAL_TABLET | ORAL | 7 refills | Status: DC

## 2019-04-30 NOTE — Progress Notes (Signed)
Established Patient Office Visit  Subjective:  Patient ID: Sherryl Valido, female    DOB: Sep 11, 1945  Age: 74 y.o. MRN: [id number]  CC: I need medications refilled  HPI Chivon Lepage is a 74 y.o. caucasian female who is presenting to the clinic for refills. Last office visit 07/2018. She is new to this provider.  Time spent discussing disease prevention screen risk vs benefit. The pt declines. She is considering the Covid Vaccine but waiting until her spouse agrees to get his.  No cp/ct, gu/gi sxs, pain, edema, sob or recent falls. She does admit to occasional vertigo that resolves with sitting down.    Past Medical History:  Diagnosis Date  . Anxiety   . Claudication (HCC)    stent in bil iliac 11/25/10  . GERD (gastroesophageal reflux disease)   . HTN (hypertension)   . Hyperlipemia   . Hypothyroidism   . Murmur, cardiac    echo 10/12/10- EF >55%, mod sclerotic aortic valve  . PVD (peripheral vascular disease) (HCC)    lower ext and upper ext-L subclavian stenosis by ultrasound, nl nuclear test- 10/27/10  . Tobacco abuse   . Vitamin D deficiency     Past Surgical History:  Procedure Laterality Date  . peripheral angio  11/25/10   right iliac-6x27 Express balloon expandable stent; Left iliac 6x37    Family History  Problem Relation Age of Onset  . Allergic rhinitis Father   . Allergic rhinitis Maternal Grandmother   . Urticaria Maternal Grandmother   . Asthma Maternal Grandfather   . Allergic rhinitis Paternal Grandmother   . Angioedema Neg Hx   . Eczema Neg Hx   . Immunodeficiency Neg Hx     Social History   Socioeconomic History  . Marital status: Married    Spouse name: Not on file  . Number of children: Not on file  . Years of education: Not on file  . Highest education level: Not on file  Occupational History  . Not on file  Tobacco Use  . Smoking status: Former Smoker    Quit date: 02/28/2002    Years since quitting: 17.1  . Smokeless tobacco: Never  Used  Substance and Sexual Activity  . Alcohol use: No  . Drug use: No  . Sexual activity: Not on file  Other Topics Concern  . Not on file  Social History Narrative  . Not on file   Social Determinants of Health   Financial Resource Strain:   . Difficulty of Paying Living Expenses: Not on file  Food Insecurity:   . Worried About Programme researcher, broadcasting/film/video in the Last Year: Not on file  . Ran Out of Food in the Last Year: Not on file  Transportation Needs:   . Lack of Transportation (Medical): Not on file  . Lack of Transportation (Non-Medical): Not on file  Physical Activity:   . Days of Exercise per Week: Not on file  . Minutes of Exercise per Session: Not on file  Stress:   . Feeling of Stress : Not on file  Social Connections:   . Frequency of Communication with Friends and Family: Not on file  . Frequency of Social Gatherings with Friends and Family: Not on file  . Attends Religious Services: Not on file  . Active Member of Clubs or Organizations: Not on file  . Attends Banker Meetings: Not on file  . Marital Status: Not on file  Intimate Partner Violence:   . Fear of Current  or Ex-Partner: Not on file  . Emotionally Abused: Not on file  . Physically Abused: Not on file  . Sexually Abused: Not on file    Outpatient Medications Prior to Visit  Medication Sig Dispense Refill  . aspirin 81 MG tablet Take 81 mg by mouth daily.    Marland Kitchen azelastine (OPTIVAR) 0.05 % ophthalmic solution Place 1 drop into both eyes 2 (two) times daily. 6 mL 5  . cholecalciferol (VITAMIN D) 1000 UNITS tablet Take 2,000 Units by mouth daily.    . cromolyn (OPTICROM) 4 % ophthalmic solution Place 1 drop into both eyes 4 (four) times daily as needed. 10 mL 5  . levothyroxine (SYNTHROID) 75 MCG tablet TAKE 1 TABLET BY MOUTH ONCE A DAY 90 tablet 1  . pantoprazole (PROTONIX) 40 MG tablet Take 1 tablet (40 mg total) by mouth daily. 30 tablet 0  . albuterol (VENTOLIN HFA) 108 (90 Base) MCG/ACT  inhaler INHALE 2 PUFFS INTO THE LUNGS EVERY 6 (SIX) HOURS AS NEEDED FOR WHEEZING 18 g 1  . benazepril (LOTENSIN) 40 MG tablet TAKE 1 TABLET BY MOUTH ONCE A DAY *NEED APPT FOR MORE REFILLS* 30 tablet 0  . citalopram (CELEXA) 40 MG tablet TAKE 1 TABLET BY MOUTH ONCE A DAY *NEED APPT FOR MORE REFILLS* 30 tablet 0  . diphenhydrAMINE (BENADRYL) 25 mg capsule Take 25 mg by mouth. 2 tabs at night    . montelukast (SINGULAIR) 10 MG tablet TAKE 1 TABLET BY MOUTH EVERY NIGHT AT BEDTIME 90 tablet 1  . predniSONE (DELTASONE) 10 MG tablet 6 tabs poqd 1-2, 5 poqd 3-4.Marland Kitchen1 tab poqd 11-12 42 tablet 0  . atorvastatin (LIPITOR) 20 MG tablet Take 1 tablet (20 mg total) by mouth at bedtime. (Patient taking differently: Take 20 mg by mouth at bedtime. ) 90 tablet 1  . atorvastatin (LIPITOR) 10 MG tablet TAKE 1 TABLET BY MOUTH ONCE DAILY 30 tablet 0   No facility-administered medications prior to visit.    Allergies  Allergen Reactions  . Banana   . Chicken Allergy   . Demerol [Meperidine]   . Eggs Or Egg-Derived Products   . Metoprolol     asthma  . Milk-Related Compounds   . Orange Fruit [Citrus]   . Other     [place], pecans  . Pineapple   . Salmon [Fish Allergy]     And tuna and oysters  . Penicillins Rash    ROS Review of Systems  All other systems reviewed and are negative.     Objective:    Physical Exam  Constitutional: She is oriented to person, place, and time. She appears well-developed and well-nourished.  HENT:  Head: Normocephalic.  Eyes: Pupils are equal, round, and reactive to light. Conjunctivae and EOM are normal.  Neck: No JVD present. Carotid bruit is present. No thyromegaly present.    Left carotid bruit audible  Cardiovascular: Normal rate and normal heart sounds.  Abnormal rythym  Pulmonary/Chest: Effort normal and breath sounds normal.  Abdominal: Soft. Bowel sounds are normal.  Musculoskeletal:        General: Normal range of motion.     Cervical back: Normal  range of motion and neck supple.  Neurological: She is alert and oriented to person, place, and time.  Skin: Skin is warm and dry.  Psychiatric: She has a normal mood and affect. Her behavior is normal. Judgment and thought content normal.  Nursing note and vitals reviewed.   BP (!) 150/80 (BP Location: Right Arm, Patient  Position: Sitting, Cuff Size: Normal)   Pulse 97   Temp 98.5 F (36.9 C) (Oral)   Resp 18   Ht 4\' 11"  (1.499 m)   Wt 102 lb 12.8 oz (46.6 kg)   SpO2 92%   BMI 20.76 kg/m  Wt Readings from Last 3 Encounters:  04/30/19 102 lb 12.8 oz (46.6 kg)  08/17/18 118 lb (53.5 kg)  03/06/17 109 lb (49.4 kg)     Health Maintenance Due  Topic Date Due  . Hepatitis C Screening  1945/03/26  . TETANUS/TDAP  03/17/1963  . COLONOSCOPY  03/16/1994  . DEXA SCAN  03/16/2009  . PNA vac Low Risk Adult (1 of 2 - PCV13) 03/16/2009  . INFLUENZA VACCINE  09/29/2018    There are no preventive care reminders to display for this patient.  Lab Results  Component Value Date   TSH 84.40 (H) 08/17/2018   Lab Results  Component Value Date   WBC 5.5 08/17/2018   HGB 12.9 08/17/2018   HCT 39.3 08/17/2018   MCV 94.7 08/17/2018   PLT 273 08/17/2018   Lab Results  Component Value Date   NA 141 08/17/2018   K 4.4 08/17/2018   CO2 27 08/17/2018   GLUCOSE 77 08/17/2018   BUN 12 08/17/2018   CREATININE 0.91 08/17/2018   BILITOT 0.5 08/17/2018   ALKPHOS 76 12/03/2015   AST 21 08/17/2018   ALT 9 08/17/2018   PROT 6.8 08/17/2018   ALBUMIN 4.3 12/03/2015   CALCIUM 9.6 08/17/2018   Lab Results  Component Value Date   CHOL 230 (H) 08/17/2018   Lab Results  Component Value Date   HDL 58 08/17/2018   Lab Results  Component Value Date   LDLCALC 138 (H) 08/17/2018   Lab Results  Component Value Date   TRIG 202 (H) 08/17/2018   Lab Results  Component Value Date   CHOLHDL 4.0 08/17/2018   No results found for: HGBA1C    Assessment & Plan:  You have a carotid bruit to  left neck and abnormal heart rhythm . I have referred to cardiology and you should complete an appointment asap.  If you begin to have sxs such as shortness of breath,chest palpitations or discomfort, chest tightness, increased vertigo call [phone] go to ER.   Plan to have lab draw today I will call you tomorrow with results and RX refill thyroid and cholesterol medications as indicated by results I have refilled other chronic regular medications.   Consider disease prevention screening completion and vaccinations as discussed and you declined today.  Problem List Items Addressed This Visit      Cardiovascular and Mediastinum   Carotid artery disease (HCC)   Relevant Medications   benazepril (LOTENSIN) 40 MG tablet   Other Relevant Orders   COMPLETE METABOLIC PANEL WITH GFR   CBC with Differential/Platelet   Lipid panel   08/19/2018 Carotid Bilateral   HTN (hypertension)   Relevant Medications   benazepril (LOTENSIN) 40 MG tablet   Other Relevant Orders   COMPLETE METABOLIC PANEL WITH GFR   CBC with Differential/Platelet   Lipid panel   US Carotid Bilateral     Respiratory   Asthma with acute exacerbation   Relevant Medications   albuterol (VENTOLIN HFA) 108 (90 Base) MCG/ACT inhaler   montelukast (SINGULAIR) 10 MG tablet   Extrinsic asthma   Relevant Medications   albuterol (VENTOLIN HFA) 108 (90 Base) MCG/ACT inhaler   montelukast (SINGULAIR) 10 MG tablet     Endocrine  Hypothyroidism   Relevant Orders   TSH   T4, free     Musculoskeletal and Integument   Intrinsic atopic dermatitis   Relevant Medications   montelukast (SINGULAIR) 10 MG tablet     Other   Hyperlipemia   Relevant Medications   benazepril (LOTENSIN) 40 MG tablet   Other Relevant Orders   Lipid panel   Anxiety   Relevant Medications   citalopram (CELEXA) 40 MG tablet    Other Visit Diagnoses    Encounter for medication management    -  Primary   Cardiac arrhythmia, unspecified cardiac arrhythmia type        Relevant Medications   benazepril (LOTENSIN) 40 MG tablet   Other Relevant Orders   EKG 12-Lead (Completed)   Ambulatory referral to Cardiology   Bruit of left carotid artery       Relevant Orders   Ambulatory referral to Cardiology   US Carotid Bilateral   Vertigo       Relevant Orders   US Carotid Bilateral       Follow-up:  Every 6 months   Annie Main, FNP

## 2019-05-01 ENCOUNTER — Other Ambulatory Visit: Payer: Self-pay | Admitting: Nurse Practitioner

## 2019-05-01 DIAGNOSIS — E785 Hyperlipidemia, unspecified: Secondary | ICD-10-CM

## 2019-05-01 DIAGNOSIS — E039 Hypothyroidism, unspecified: Secondary | ICD-10-CM

## 2019-05-01 DIAGNOSIS — R7309 Other abnormal glucose: Secondary | ICD-10-CM

## 2019-05-01 DIAGNOSIS — I10 Essential (primary) hypertension: Secondary | ICD-10-CM

## 2019-05-01 LAB — CBC WITH DIFFERENTIAL/PLATELET
Absolute Monocytes: 685 cells/uL (ref 200–950)
Basophils Absolute: 58 cells/uL (ref 0–200)
Basophils Relative: 0.9 %
Eosinophils Absolute: 294 cells/uL (ref 15–500)
Eosinophils Relative: 4.6 %
HCT: 40 % (ref 35.0–45.0)
Hemoglobin: 13.1 g/dL (ref 11.7–15.5)
Lymphs Abs: 1466 cells/uL (ref 850–3900)
MCH: 31 pg (ref 27.0–33.0)
MCHC: 32.8 g/dL (ref 32.0–36.0)
MCV: 94.6 fL (ref 80.0–100.0)
MPV: 11 fL (ref 7.5–12.5)
Monocytes Relative: 10.7 %
Neutro Abs: 3898 cells/uL (ref 1500–7800)
Neutrophils Relative %: 60.9 %
Platelets: 319 10*3/uL (ref 140–400)
RBC: 4.23 10*6/uL (ref 3.80–5.10)
RDW: 13.8 % (ref 11.0–15.0)
Total Lymphocyte: 22.9 %
WBC: 6.4 10*3/uL (ref 3.8–10.8)

## 2019-05-01 LAB — COMPLETE METABOLIC PANEL WITH GFR
AG Ratio: 1.6 (calc) (ref 1.0–2.5)
ALT: 13 U/L (ref 6–29)
AST: 18 U/L (ref 10–35)
Albumin: 4.2 g/dL (ref 3.6–5.1)
Alkaline phosphatase (APISO): 79 U/L (ref 37–153)
BUN: 12 mg/dL (ref 7–25)
CO2: 28 mmol/L (ref 20–32)
Calcium: 9.9 mg/dL (ref 8.6–10.4)
Chloride: 102 mmol/L (ref 98–110)
Creat: 0.79 mg/dL (ref 0.60–0.93)
GFR, Est African American: 85 mL/min/{1.73_m2} (ref >=60)
GFR, Est Non African American: 73 mL/min/{1.73_m2} (ref >=60)
Globulin: 2.7 g/dL (calc) (ref 1.9–3.7)
Glucose, Bld: 113 mg/dL — ABNORMAL HIGH (ref 65–99)
Potassium: 3.9 mmol/L (ref 3.5–5.3)
Sodium: 143 mmol/L (ref 135–146)
Total Bilirubin: 0.4 mg/dL (ref 0.2–1.2)
Total Protein: 6.9 g/dL (ref 6.1–8.1)

## 2019-05-01 LAB — LIPID PANEL
Cholesterol: 228 mg/dL — ABNORMAL HIGH (ref <200)
HDL: 67 mg/dL (ref >=50)
LDL Cholesterol (Calc): 131 mg/dL (calc) — ABNORMAL HIGH
Non-HDL Cholesterol (Calc): 161 mg/dL (calc) — ABNORMAL HIGH (ref <130)
Total CHOL/HDL Ratio: 3.4 (calc) (ref <5.0)
Triglycerides: 161 mg/dL — ABNORMAL HIGH (ref <150)

## 2019-05-01 LAB — TSH: TSH: 0.04 mIU/L — ABNORMAL LOW (ref 0.40–4.50)

## 2019-05-01 LAB — T4, FREE: Free T4: 1.4 ng/dL (ref 0.8–1.8)

## 2019-05-01 MED ORDER — LEVOTHYROXINE SODIUM 50 MCG PO TABS: 50.0000 ug | ORAL_TABLET | Freq: Every day | ORAL | 2 refills | Status: DC

## 2019-05-01 MED ORDER — ATORVASTATIN CALCIUM 40 MG PO TABS: 40.0000 mg | ORAL_TABLET | Freq: Every day | ORAL | 2 refills | Status: DC

## 2019-05-01 NOTE — Progress Notes (Signed)
A few changes based on labs:  1. I have reduced the dose of thyroid med from 75 to 50. I sent refill for 6 months  2. I have increase atorvastatin dose from 20 to 40. I sent refill for 6 months. Share cholesterol lowering diet and exercise.  3. I need you to ask lab if they can add A1C to her blood from yesterday if so order is in if not pt needs to come in for lab draw. Her glucose was elevated.  4. She will need apt to come in 7 weeks from today. She will need thyroid labs one week prior to that apt no sooner.   Thanks

## 2019-05-08 ENCOUNTER — Telehealth: Payer: Self-pay | Admitting: Cardiology

## 2019-05-08 NOTE — Telephone Encounter (Signed)
Patient states she is requesting for her husband, Dorma Russell, to accompany her during her appointment scheduled for 05/09/19 at 10:20 AM with Dr. Cristal Deer due to the patient having bad anxiety. Patient states a voice message may be left if she is not available. Please advise.

## 2019-05-08 NOTE — Telephone Encounter (Signed)
Let a message advising pt of visitor's policy.

## 2019-05-09 ENCOUNTER — Other Ambulatory Visit: Payer: Self-pay

## 2019-05-09 ENCOUNTER — Ambulatory Visit: Payer: Medicare Other | Admitting: Cardiology

## 2019-05-09 ENCOUNTER — Encounter: Payer: Self-pay | Admitting: Cardiology

## 2019-05-09 VITALS — BP 138/72 | HR 81 | Ht 59.0 in | Wt 102.2 lb

## 2019-05-09 DIAGNOSIS — I10 Essential (primary) hypertension: Secondary | ICD-10-CM | POA: Diagnosis not present

## 2019-05-09 DIAGNOSIS — I739 Peripheral vascular disease, unspecified: Secondary | ICD-10-CM

## 2019-05-09 DIAGNOSIS — E785 Hyperlipidemia, unspecified: Secondary | ICD-10-CM

## 2019-05-09 DIAGNOSIS — I6523 Occlusion and stenosis of bilateral carotid arteries: Secondary | ICD-10-CM

## 2019-05-09 DIAGNOSIS — R0989 Other specified symptoms and signs involving the circulatory and respiratory systems: Secondary | ICD-10-CM

## 2019-05-09 DIAGNOSIS — I771 Stricture of artery: Secondary | ICD-10-CM

## 2019-05-09 DIAGNOSIS — Z7189 Other specified counseling: Secondary | ICD-10-CM

## 2019-05-09 NOTE — Progress Notes (Signed)
Cardiology Office Note:    Date:  05/09/2019   ID:  Alexis [name], DOB [birth year], MRN [id number]  PCP:  Alexis Brooks, MD  Cardiologist:  Alexis Red, MD  Referring MD: Alexis Guise, FNP   CC: new patient evaluation for carotid bruit  History of Present Illness:    Alexis [name] is a 74 y.o. female with a hx of PAD (carotid artery disease, subclavian artery stenosis, claudication), hypertension, asthma, hyperlipidemia who is seen as a new consult at the request of Alexis Guise, FNP for the evaluation and management of carotid bruit. She was last seen by Dr. Allyson [name] in 2015.  Note from 04/30/19 with Alexis Fiscal, FNP reviewed. Noted to have left carotid bruit at that appt. Carotid ultrasounds ordered but not yet completed. Prior carotid ultrasounds from 01/09/17 reviewed, bilateral 40-59% stenosis noted at that time.  Cardiovascular risk factors: Prior clinical ASCVD: no MI or CVA. Does have prior history of PAD, no further claudication Comorbid conditions: endorses hypertension, hyperlipidemia. Denies diabetes, chronic kidney disease:  Metabolic syndrome/Obesity: BMI 20 Chronic inflammatory conditions: none, though has had intermittent steroids for eczema Tobacco use history: quit ~2004-2005, quit cold [place]. Was 2 ppd smoker for >40 years. Family history: father had MI, had angioplasty, no damage to his heart. Mother without heart issues. Brother is in his late 95s, had massive MI (silent) and developed heart failure. Had 5V CABG, also found to have AAA. Multiple distant family members on her mother's side with heart issues.  Prior cardiac testing and/or incidental findings on other testing (ie coronary calcium): echo 2012, vascular studies reviewed. No stress test, no heart cath. Prior stenting of iliac arteries in 2012. Exercise level: no intentional activity. Able to walk, climb stairs. Does house work without issue. Current diet: likes chicken,  fruits/vegetables. Tries to avoid fried foods.  Denies chest pain, shortness of breath at rest or with normal exertion. No PND, orthopnea, LE edema or unexpected weight gain. No syncope or palpitations. No focal neuro deficits, no amaurosis.  We reviewed her recent labs. She was not taking the atorvastatin at the time of her lipids. She has since tried to restart taking it (for about 2 days). We discussed if it is easier to take in the morning, this is fine. Last LDL was 131, discussed that goal is <70. She was not fasting for labs.  Has blood pressure cuff at home, but not sure where it is. Not checking routinely.  No palpitations, but she was told she might have afib at her recent visit. ECG today is sinus with PACs. ECG from 3/2 is sinus tachycardia.  Past Medical History:  Diagnosis Date  . Anxiety   . Claudication (HCC)    stent in bil iliac 11/25/10  . GERD (gastroesophageal reflux disease)   . HTN (hypertension)   . Hyperlipemia   . Hypothyroidism   . Murmur, cardiac    echo 10/12/10- EF >55%, mod sclerotic aortic valve  . PVD (peripheral vascular disease) (HCC)    lower ext and upper ext-L subclavian stenosis by ultrasound, nl nuclear test- 10/27/10  . Tobacco abuse   . Vitamin D deficiency     Past Surgical History:  Procedure Laterality Date  . peripheral angio  11/25/10   right iliac-6x27 Express balloon expandable stent; Left iliac 6x37    Current Medications: Current Outpatient Medications on File Prior to Visit  Medication Sig  . albuterol (VENTOLIN HFA) 108 (90 Base) MCG/ACT inhaler INHALE 2 PUFFS INTO THE LUNGS  EVERY 6 (SIX) HOURS AS NEEDED FOR WHEEZING  . aspirin 81 MG tablet Take 81 mg by mouth daily.  Marland Kitchen atorvastatin (LIPITOR) 40 MG tablet Take 1 tablet (40 mg total) by mouth daily.  Marland Kitchen azelastine (OPTIVAR) 0.05 % ophthalmic solution Place 1 drop into both eyes 2 (two) times daily.  . benazepril (LOTENSIN) 40 MG tablet TAKE 1 TABLET BY MOUTH ONCE A DAY  .  cholecalciferol (VITAMIN D) 1000 UNITS tablet Take 2,000 Units by mouth daily.  . citalopram (CELEXA) 40 MG tablet TAKE 1 TABLET BY MOUTH ONCE A DAY *NEED APPT FOR MORE REFILLS*  . levothyroxine (SYNTHROID) 50 MCG tablet Take 1 tablet (50 mcg total) by mouth daily.  . montelukast (SINGULAIR) 10 MG tablet Take 1 tablet (10 mg total) by mouth at bedtime.  . pantoprazole (PROTONIX) 40 MG tablet Take 1 tablet (40 mg total) by mouth daily.   No current facility-administered medications on file prior to visit.     Allergies:   Banana, Chicken allergy, Demerol [meperidine], Eggs or egg-derived products, Metoprolol, Milk-related compounds, Orange fruit [citrus], Other, Pineapple, Salmon [fish allergy], and Penicillins   Social History   Tobacco Use  . Smoking status: Former Smoker    Quit date: 02/28/2002    Years since quitting: 17.2  . Smokeless tobacco: Never Used  Substance Use Topics  . Alcohol use: No  . Drug use: No    Family History: family history includes Allergic rhinitis in her father, maternal grandmother, and paternal grandmother; Asthma in her maternal grandfather; Urticaria in her maternal grandmother. There is no history of Angioedema, Eczema, or Immunodeficiency.  ROS:   Please see the history of present illness.  Additional pertinent ROS: Constitutional: Negative for chills, fever, night sweats, unintentional weight loss  HENT: Negative for ear pain and hearing loss.   Eyes: Negative for loss of vision and eye pain.  Respiratory: Negative for cough, sputum, wheezing.   Cardiovascular: See HPI. Gastrointestinal: Negative for abdominal pain, melena, and hematochezia.  Genitourinary: Negative for dysuria and hematuria.  Musculoskeletal: Negative for falls and myalgias.  Skin: Negative for itching and rash.  Neurological: Negative for focal weakness, focal sensory changes and loss of consciousness.  Endo/Heme/Allergies: Does not bruise/bleed easily.     EKGs/Labs/Other  Studies Reviewed:    The following studies were reviewed today: Echo report from 2012 reviewed  EKG:  EKG is personally reviewed.  The ekg ordered today demonstrates sinus rhythm with PAC  Recent Labs: 04/30/2019: ALT 13; BUN 12; Creat 0.79; Hemoglobin 13.1; Platelets 319; Potassium 3.9; Sodium 143; TSH 0.04  Recent Lipid Panel    Component Value Date/Time   CHOL 228 (H) 04/30/2019 1513   TRIG 161 (H) 04/30/2019 1513   HDL 67 04/30/2019 1513   CHOLHDL 3.4 04/30/2019 1513   VLDL 23 12/03/2015 1212   LDLCALC 131 (H) 04/30/2019 1513    Physical Exam:    VS:  BP 138/72   Pulse 81   Ht 4\' 11"  (1.499 m)   Wt 102 lb 3.2 oz (46.4 kg)   SpO2 100%   BMI 20.64 kg/m     Wt Readings from Last 3 Encounters:  05/09/19 102 lb 3.2 oz (46.4 kg)  04/30/19 102 lb 12.8 oz (46.6 kg)  08/17/18 118 lb (53.5 kg)    GEN: Well nourished, well developed in no acute distress HEENT: Normal, moist mucous membranes NECK: No JVD CARDIAC: regular rhythm, normal S1 and S2, no rubs or gallops. No murmurs. VASCULAR: Radial 2+ and  DP pulses 1+ bilaterally. Soft bilateral carotid bruits RESPIRATORY:  Clear to auscultation without rales, wheezing or rhonchi  ABDOMEN: Soft, non-tender, non-distended MUSCULOSKELETAL:  Ambulates independently SKIN: Warm and dry, no edema NEUROLOGIC:  Alert and oriented x 3. No focal neuro deficits noted. PSYCHIATRIC:  Normal affect    ASSESSMENT:    1. PAD (peripheral artery disease) ([place])   2. Essential hypertension   3. Hyperlipidemia, unspecified hyperlipidemia type   4. Bilateral carotid artery stenosis   5. Subclavian artery stenosis, left (HCC)   6. Bruit   7. Cardiac risk counseling   8. Counseling on health promotion and disease prevention    PLAN:    PAD: carotid disease, subclavian stenosis, history of claudication -soft bilateral carotid bruits. -will reorder studies as vascular studies for full evaluation -continue aspirin, statin -counseled on [name]  flag warning signs that need immediate medical attention -discussed risk factor management  Hypertension: goal <130/80, just above systolic goal today -counseled on how to take home BP -continue benazepril 40 mg -she will call if BP numbers at home are consistently elevated  Hyperlipidemia: LDL goal <70 given PAD -recent lipids from 04/30/19 reviewed. LDL 131, above goal. On atorvastatin 40 mg, but she was not taking regularly at the time of her labs. She will work on taking this daily. Recommend recheck lipids 8-12 weeks after on consistent regimen  Cardiac risk counseling and prevention recommendations: -recommend heart healthy/Mediterranean diet, with whole grains, fruits, vegetable, fish, lean meats, nuts, and olive oil. Limit salt. -recommend moderate walking, 3-5 times/week for 30-50 minutes each session. Aim for at least 150 minutes.week. Goal should be pace of 3 miles/hours, or walking 1.5 miles in 30 minutes -recommend avoidance of tobacco products. Avoid excess alcohol.  Plan for follow up: 1 year or sooner as needed  Buford Dresser, MD, PhD [place]  [facility]    Medication Adjustments/Labs and Tests Ordered: Current medicines are reviewed at length with the patient today.  Concerns regarding medicines are outlined above.  Orders Placed This Encounter  Procedures  . EKG 12-Lead  . VAS US CAROTID   No orders of the defined types were placed in this encounter.   Patient Instructions  Medication Instructions:  Your Physician recommend you continue on your current medication as directed.    *If you need a refill on your cardiac medications before your next appointment, please call your pharmacy*   Lab Work: None  Testing/Procedures: Your physician has requested that you have a carotid duplex. This test is an ultrasound of the carotid arteries in your neck. It looks at blood flow through these arteries that supply the brain with blood. Allow one hour for  this exam. There are no restrictions or special instructions. [place]   Follow-Up: At [facility], you and your health needs are our priority.  As part of our continuing mission to provide you with exceptional heart care, we have created designated Provider Care Teams.  These Care Teams include your primary Cardiologist (physician) and Advanced Practice Providers (APPs -  Physician Assistants and Nurse Practitioners) who all work together to provide you with the care you need, when you need it.  We recommend signing up for the patient portal called "MyChart".  Sign up information is provided on this After Visit Summary.  MyChart is used to connect with patients for Virtual Visits (Telemedicine).  Patients are able to view lab/test results, encounter notes, upcoming appointments, etc.  Non-urgent messages can be sent to  your provider as well.   To learn more about what you can do with MyChart, go to [email or web address].    Your next appointment:   1 year(s)  The format for your next appointment:   In Person  Provider:   Jodelle Red, MD   Other Instructions -counseled on how to check blood pressure:  -sit comfortably in a chair, feet uncrossed and flat on floor, for 5-10 minutes  -arm ideally should rest at the level of the heart. However, arm should be relaxed and not tense (for example, do not hold the arm up unsupported)  -avoid exercise, caffeine, and tobacco for at least 30 minutes prior to BP reading  -don't take BP cuff reading over clothes (always place on skin directly)  -I prefer to know how well the medication is working, so I would like you to take your readings 1-2 hours after taking your blood pressure medication if possible   Signed, Alexis Red, MD PhD 05/09/2019 4:46 PM    [facility]

## 2019-05-09 NOTE — Patient Instructions (Addendum)
Medication Instructions:  Your Physician recommend you continue on your current medication as directed.    *If you need a refill on your cardiac medications before your next appointment, please call your pharmacy*   Lab Work: None  Testing/Procedures: Your physician has requested that you have a carotid duplex. This test is an ultrasound of the carotid arteries in your neck. It looks at blood flow through these arteries that supply the brain with blood. Allow one hour for this exam. There are no restrictions or special instructions. [address]   Follow-Up: At [facility], you and your health needs are our priority.  As part of our continuing mission to provide you with exceptional heart care, we have created designated Provider Care Teams.  These Care Teams include your primary Cardiologist (physician) and Advanced Practice Providers (APPs -  Physician Assistants and Nurse Practitioners) who all work together to provide you with the care you need, when you need it.  We recommend signing up for the patient portal called "MyChart".  Sign up information is provided on this After Visit Summary.  MyChart is used to connect with patients for Virtual Visits (Telemedicine).  Patients are able to view lab/test results, encounter notes, upcoming appointments, etc.  Non-urgent messages can be sent to your provider as well.   To learn more about what you can do with MyChart, go to [email or web address].    Your next appointment:   1 year(s)  The format for your next appointment:   In Person  Provider:   Jodelle Red, MD   Other Instructions -counseled on how to check blood pressure:  -sit comfortably in a chair, feet uncrossed and flat on floor, for 5-10 minutes  -arm ideally should rest at the level of the heart. However, arm should be relaxed and not tense (for example, do not hold the arm up unsupported)  -avoid exercise, caffeine, and tobacco for at least 30  minutes prior to BP reading  -don't take BP cuff reading over clothes (always place on skin directly)  -I prefer to know how well the medication is working, so I would like you to take your readings 1-2 hours after taking your blood pressure medication if possible

## 2019-05-15 ENCOUNTER — Ambulatory Visit (HOSPITAL_COMMUNITY)
Admission: RE | Admit: 2019-05-15 | Discharge: 2019-05-15 | Disposition: A | Discharge status: Home / Self Care | Payer: Medicare Other | Source: Ambulatory Visit | Attending: Cardiovascular Disease | Admitting: Cardiovascular Disease

## 2019-05-15 ENCOUNTER — Other Ambulatory Visit: Payer: Self-pay

## 2019-05-15 ENCOUNTER — Other Ambulatory Visit (HOSPITAL_COMMUNITY): Payer: Self-pay | Admitting: Cardiology

## 2019-05-15 DIAGNOSIS — I6523 Occlusion and stenosis of bilateral carotid arteries: Secondary | ICD-10-CM

## 2019-05-15 DIAGNOSIS — R0989 Other specified symptoms and signs involving the circulatory and respiratory systems: Secondary | ICD-10-CM

## 2019-06-10 ENCOUNTER — Other Ambulatory Visit: Payer: Self-pay

## 2019-06-10 DIAGNOSIS — I6523 Occlusion and stenosis of bilateral carotid arteries: Secondary | ICD-10-CM

## 2019-06-27 ENCOUNTER — Encounter: Payer: Self-pay | Admitting: Cardiology

## 2019-10-23 ENCOUNTER — Other Ambulatory Visit: Payer: Self-pay | Admitting: Family Medicine

## 2019-11-07 ENCOUNTER — Other Ambulatory Visit: Payer: Self-pay | Admitting: Family Medicine

## 2019-11-21 ENCOUNTER — Ambulatory Visit: Payer: Medicare Other | Admitting: Family Medicine

## 2019-11-26 ENCOUNTER — Ambulatory Visit (INDEPENDENT_AMBULATORY_CARE_PROVIDER_SITE_OTHER): Payer: Medicare Other | Admitting: Family Medicine

## 2019-11-26 ENCOUNTER — Encounter: Payer: Self-pay | Admitting: Family Medicine

## 2019-11-26 ENCOUNTER — Other Ambulatory Visit: Payer: Self-pay

## 2019-11-26 VITALS — BP 130/64 | HR 72 | Temp 97.4°F | Ht 59.0 in | Wt 96.8 lb

## 2019-11-26 DIAGNOSIS — R7301 Impaired fasting glucose: Secondary | ICD-10-CM

## 2019-11-26 DIAGNOSIS — J4520 Mild intermittent asthma, uncomplicated: Secondary | ICD-10-CM

## 2019-11-26 DIAGNOSIS — L309 Dermatitis, unspecified: Secondary | ICD-10-CM

## 2019-11-26 DIAGNOSIS — E785 Hyperlipidemia, unspecified: Secondary | ICD-10-CM | POA: Diagnosis not present

## 2019-11-26 DIAGNOSIS — I739 Peripheral vascular disease, unspecified: Secondary | ICD-10-CM

## 2019-11-26 DIAGNOSIS — E039 Hypothyroidism, unspecified: Secondary | ICD-10-CM

## 2019-11-26 DIAGNOSIS — F419 Anxiety disorder, unspecified: Secondary | ICD-10-CM

## 2019-11-26 DIAGNOSIS — I10 Essential (primary) hypertension: Secondary | ICD-10-CM

## 2019-11-26 MED ORDER — TRIAMCINOLONE ACETONIDE 0.1 % EX CREA: 1.0000 "application " | TOPICAL_CREAM | Freq: Two times a day (BID) | CUTANEOUS | 3 refills | Status: DC

## 2019-11-26 MED ORDER — ALBUTEROL SULFATE HFA 108 (90 BASE) MCG/ACT IN AERS: INHALATION_SPRAY | RESPIRATORY_TRACT | 6 refills | Status: DC

## 2019-11-26 NOTE — Assessment & Plan Note (Signed)
Given TAC cream to use

## 2019-11-26 NOTE — Assessment & Plan Note (Signed)
Prn albuterol She stopped singulair felt no improvement Declines immunizations

## 2019-11-26 NOTE — Patient Instructions (Signed)
F/U 6 months for wellness visit  

## 2019-11-26 NOTE — Assessment & Plan Note (Signed)
Recheck TFT  Continue replacement  

## 2019-11-26 NOTE — Progress Notes (Addendum)
Subjective:    Patient ID: Alexis [name], female    DOB: January 06, 1946, 74 y.o.   MRN: [id number]  Patient presents for Eczema and medication maintenance  Pt here to f/u chronic medical problem , previous pt our or NP who has left the practice, this is my first time meeting her   Medication reviewed   Hypothyroidims currently taking levothyroxine , for respeat TSH, no history of thyrid cancer  Hyperlipidemia-taking lipitor 40mg  this was increased in March    Elevated fasting glucose due for A1C   Allergic rhinitis previously  followed by Dr. April was previously on allergy, takes benadryl or xyzal    No longer taking singulair  Asthma uses albuterol as needed  GERD/hiatal hernia- taking nexium 20mg  once a day    HTN- taking benezapril without any side affects, ASA  81mg    she has history of heart disease in her family   ANXIETY - taking celexa has been on this for many years and it helps a lot   Cardiology - Dr. Sharyn Lull E, followed for PVD, Carotid attery   ezcema using cortisone 10 , doesn't like ointments, has on her legs, arms, chest, this is chronic wanted a new cream to try   She has been told by cardiology HR is irregular but not a fib, she is not symptomatic   Review Of Systems:  GEN- denies fatigue, fever, weight loss,weakness, recent illness HEENT- denies eye drainage, change in vision, nasal discharge, CVS- denies chest pain, palpitations RESP- denies SOB, cough, wheeze ABD- denies N/V, change in stools, abd pain GU- denies dysuria, hematuria, dribbling, incontinence MSK- denies joint pain, muscle aches, injury Neuro- denies headache, dizziness, syncope, seizure activity       Objective:    BP 130/64   Pulse 72   Temp (!) 97.4 F (36.3 C)   Ht 4\' 11"  (1.499 m)   Wt 96 lb 12.8 oz (43.9 kg)   SpO2 99%   BMI 19.55 kg/m  GEN- NAD, alert and oriented x3 HEENT- PERRL, EOMI, non injected sclera, pink conjunctiva, MMM, oropharynx clear Neck-  Supple, no thyromegaly CVS- irregular rhythem ? PVC, no murmur RESP-CTAB ABD-NABS,soft,NT,ND EXT- No edema Pulses- Radial, DP- 2+        Assessment & Plan:      Problem List Items Addressed This Visit      Unprioritized   Anxiety    Symptoms controlled on celexa       Eczema    Given TAC cream to use       Extrinsic asthma    Prn albuterol She stopped singulair felt no improvement Declines immunizations       Relevant Medications   albuterol (VENTOLIN HFA) 108 (90 Base) MCG/ACT inhaler   HTN (hypertension) - Primary   Relevant Orders   CBC with Differential/Platelet   Comprehensive metabolic panel   Hyperlipemia   Relevant Orders   Lipid panel   Hypothyroidism    Recheck TFT  Continue replacement       Relevant Orders   TSH   T3, free   Peripheral vascular disease with claudication (HCC)    Followed by cardiology, no changes to meds Check lipids Continue ASA BP controlled        Other Visit Diagnoses    Elevated fasting glucose       Relevant Orders   Hemoglobin A1c      Note: This dictation was prepared with Dragon dictation along with smaller phrase technology. Any transcriptional errors  that result from this process are unintentional.

## 2019-11-26 NOTE — Assessment & Plan Note (Signed)
Symptoms controlled on celexa

## 2019-11-26 NOTE — Assessment & Plan Note (Signed)
Followed by cardiology, no changes to meds Check lipids Continue ASA BP controlled

## 2019-11-27 LAB — TSH: TSH: 0.81 mIU/L (ref 0.40–4.50)

## 2019-11-27 LAB — COMPREHENSIVE METABOLIC PANEL
AG Ratio: 1.7 (calc) (ref 1.0–2.5)
ALT: 10 U/L (ref 6–29)
AST: 18 U/L (ref 10–35)
Albumin: 4.5 g/dL (ref 3.6–5.1)
Alkaline phosphatase (APISO): 75 U/L (ref 37–153)
BUN: 13 mg/dL (ref 7–25)
CO2: 31 mmol/L (ref 20–32)
Calcium: 9.8 mg/dL (ref 8.6–10.4)
Chloride: 103 mmol/L (ref 98–110)
Creat: 0.77 mg/dL (ref 0.60–0.93)
Globulin: 2.7 g/dL (calc) (ref 1.9–3.7)
Glucose, Bld: 81 mg/dL (ref 65–99)
Potassium: 4.6 mmol/L (ref 3.5–5.3)
Sodium: 142 mmol/L (ref 135–146)
Total Bilirubin: 0.5 mg/dL (ref 0.2–1.2)
Total Protein: 7.2 g/dL (ref 6.1–8.1)

## 2019-11-27 LAB — CBC WITH DIFFERENTIAL/PLATELET
Absolute Monocytes: 676 cells/uL (ref 200–950)
Basophils Absolute: 37 cells/uL (ref 0–200)
Basophils Relative: 0.6 %
Eosinophils Absolute: 341 cells/uL (ref 15–500)
Eosinophils Relative: 5.5 %
HCT: 36.6 % (ref 35.0–45.0)
Hemoglobin: 11.4 g/dL — ABNORMAL LOW (ref 11.7–15.5)
Lymphs Abs: 1147 cells/uL (ref 850–3900)
MCH: 28.4 pg (ref 27.0–33.0)
MCHC: 31.1 g/dL — ABNORMAL LOW (ref 32.0–36.0)
MCV: 91.3 fL (ref 80.0–100.0)
MPV: 11.6 fL (ref 7.5–12.5)
Monocytes Relative: 10.9 %
Neutro Abs: 3999 cells/uL (ref 1500–7800)
Neutrophils Relative %: 64.5 %
Platelets: 334 10*3/uL (ref 140–400)
RBC: 4.01 10*6/uL (ref 3.80–5.10)
RDW: 13.7 % (ref 11.0–15.0)
Total Lymphocyte: 18.5 %
WBC: 6.2 10*3/uL (ref 3.8–10.8)

## 2019-11-27 LAB — LIPID PANEL
Cholesterol: 154 mg/dL (ref <200)
HDL: 55 mg/dL (ref >=50)
LDL Cholesterol (Calc): 79 mg/dL (calc)
Non-HDL Cholesterol (Calc): 99 mg/dL (calc) (ref <130)
Total CHOL/HDL Ratio: 2.8 (calc) (ref <5.0)
Triglycerides: 121 mg/dL (ref <150)

## 2019-11-27 LAB — HEMOGLOBIN A1C
Hgb A1c MFr Bld: 5.5 % of total Hgb (ref <5.7)
Mean Plasma Glucose: 111 (calc)
eAG (mmol/L): 6.2 (calc)

## 2019-11-27 LAB — T3, FREE: T3, Free: 2.9 pg/mL (ref 2.3–4.2)

## 2019-11-28 ENCOUNTER — Encounter: Payer: Self-pay | Admitting: *Deleted

## 2020-01-21 ENCOUNTER — Other Ambulatory Visit: Payer: Self-pay

## 2020-01-21 DIAGNOSIS — E785 Hyperlipidemia, unspecified: Secondary | ICD-10-CM

## 2020-01-21 DIAGNOSIS — F419 Anxiety disorder, unspecified: Secondary | ICD-10-CM

## 2020-01-21 MED ORDER — ATORVASTATIN CALCIUM 40 MG PO TABS: 40.0000 mg | ORAL_TABLET | Freq: Every day | ORAL | 2 refills | Status: DC

## 2020-01-21 MED ORDER — CITALOPRAM HYDROBROMIDE 40 MG PO TABS: ORAL_TABLET | ORAL | 7 refills | Status: DC

## 2020-02-10 ENCOUNTER — Other Ambulatory Visit: Payer: Self-pay | Admitting: Family Medicine

## 2020-04-15 ENCOUNTER — Other Ambulatory Visit: Payer: Self-pay | Admitting: Family Medicine

## 2020-04-15 DIAGNOSIS — I10 Essential (primary) hypertension: Secondary | ICD-10-CM

## 2020-04-30 ENCOUNTER — Telehealth: Payer: Self-pay | Admitting: Cardiology

## 2020-04-30 NOTE — Telephone Encounter (Signed)
3.3.22 called home phone [phone] to schd 1 yr fu w/Dr. Cristal Deer. Call dropped. Left message on cell phone to sch fu. lp

## 2020-05-11 ENCOUNTER — Other Ambulatory Visit: Payer: Self-pay | Admitting: Family Medicine

## 2020-05-14 ENCOUNTER — Other Ambulatory Visit: Payer: Self-pay

## 2020-05-14 ENCOUNTER — Ambulatory Visit (HOSPITAL_COMMUNITY)
Admission: RE | Admit: 2020-05-14 | Discharge: 2020-05-14 | Disposition: A | Discharge status: Home / Self Care | Payer: Medicare Other | Source: Ambulatory Visit | Attending: Internal Medicine | Admitting: Internal Medicine

## 2020-05-14 DIAGNOSIS — I6523 Occlusion and stenosis of bilateral carotid arteries: Secondary | ICD-10-CM | POA: Diagnosis not present

## 2020-05-15 ENCOUNTER — Ambulatory Visit: Payer: Medicare Other | Admitting: Cardiology

## 2020-05-15 ENCOUNTER — Encounter: Payer: Self-pay | Admitting: Cardiology

## 2020-05-15 VITALS — BP 121/59 | HR 78 | Ht 59.0 in | Wt 97.0 lb

## 2020-05-15 DIAGNOSIS — R0602 Shortness of breath: Secondary | ICD-10-CM | POA: Diagnosis not present

## 2020-05-15 DIAGNOSIS — E785 Hyperlipidemia, unspecified: Secondary | ICD-10-CM

## 2020-05-15 DIAGNOSIS — R079 Chest pain, unspecified: Secondary | ICD-10-CM | POA: Diagnosis not present

## 2020-05-15 DIAGNOSIS — I6523 Occlusion and stenosis of bilateral carotid arteries: Secondary | ICD-10-CM | POA: Diagnosis not present

## 2020-05-15 DIAGNOSIS — I739 Peripheral vascular disease, unspecified: Secondary | ICD-10-CM

## 2020-05-15 DIAGNOSIS — Z01812 Encounter for preprocedural laboratory examination: Secondary | ICD-10-CM

## 2020-05-15 DIAGNOSIS — R0989 Other specified symptoms and signs involving the circulatory and respiratory systems: Secondary | ICD-10-CM

## 2020-05-15 DIAGNOSIS — I771 Stricture of artery: Secondary | ICD-10-CM | POA: Diagnosis not present

## 2020-05-15 DIAGNOSIS — I10 Essential (primary) hypertension: Secondary | ICD-10-CM

## 2020-05-15 NOTE — Patient Instructions (Addendum)
Medication Instructions:  Your Physician recommend you continue on your current medication as directed.    *If you need a refill on your cardiac medications before your next appointment, please call your pharmacy*   Lab Work: Your physician recommends lab work today (CBC, BMP, Lipid).  If you have labs (blood work) drawn today and your tests are completely normal, you will receive your results only by: Marland Kitchen MyChart Message (if you have MyChart) OR . A paper copy in the mail If you have any lab test that is abnormal or we need to change your treatment, we will call you to review the results.   Testing/Procedures: Your physician has requested that you have an echocardiogram. Echocardiography is a painless test that uses sound waves to create images of your heart. It provides your doctor with information about the size and shape of your heart and how well your heart's chambers and valves are working. This procedure takes approximately one hour. There are no restrictions for this procedure. [address]  Your physician has requested that you have a cardiac catheterization. Cardiac catheterization is used to diagnose and/or treat various heart conditions. Doctors may recommend this procedure for a number of different reasons. The most common reason is to evaluate chest pain. Chest pain can be a symptom of coronary artery disease (CAD), and cardiac catheterization can show whether plaque is narrowing or blocking your heart's arteries. This procedure is also used to evaluate the valves, as well as measure the blood flow and oxygen levels in different parts of your heart. For further information please visit [email or web address] Please follow instruction sheet, as given. [facility]  You will need to have the coronavirus test completed prior to your procedure. An appointment has been made at 11:25 on Monday 05/18/20. This is a Drive Up Visit at [address],  [place], [place] [address]. Please tell them that you are there for procedure testing. Stay in your car and someone will be with you shortly. Please make sure to have all other labs completed before this test because you will need to stay quarantined until your procedure.     Follow-Up: At [facility], you and your health needs are our priority.  As part of our continuing mission to provide you with exceptional heart care, we have created designated Provider Care Teams.  These Care Teams include your primary Cardiologist (physician) and Advanced Practice Providers (APPs -  Physician Assistants and Nurse Practitioners) who all work together to provide you with the care you need, when you need it.  We recommend signing up for the patient portal called "MyChart".  Sign up information is provided on this After Visit Summary.  MyChart is used to connect with patients for Virtual Visits (Telemedicine).  Patients are able to view lab/test results, encounter notes, upcoming appointments, etc.  Non-urgent messages can be sent to your provider as well.   To learn more about what you can do with MyChart, go to [email or web address].    Your next appointment:   4-6 week(s)  The format for your next appointment:   In Person  Provider:   Jodelle Red, MD      [facility] CARDIOVASCULAR DIVISION [facility] [address] [place] [address] Dept: [phone] Loc: [phone]  Kameria Canizares  05/15/2020  You are scheduled for a Cardiac Catheterization on Thursday, March 24 with Dr. [name].  1. Please arrive at the [facility] (Main Entrance  A) at [facility]: [address] [place], [place] [address] a 7:30 am (This time is two hours before your procedure to ensure your preparation). Free valet parking service is available.   Special note: Every effort is made to have your procedure done on time. Please understand that  emergencies sometimes delay scheduled procedures.  2. Diet: Do not eat solid foods after midnight.  The patient may have clear liquids until 5am upon the day of the procedure.  3. Labs: You will need to have blood drawn today (CBC, BMP, Lipid)  4. Medication instructions in preparation for your procedure:   Contrast Allergy: No   On the morning of your procedure, take your Aspirin and any morning medicines NOT listed above.  You may use sips of water.  5. Plan for one night stay--bring personal belongings. 6. Bring a current list of your medications and current insurance cards. 7. You MUST have a responsible person to drive you home. 8. Someone MUST be with you the first 24 hours after you arrive home or your discharge will be delayed. 9. Please wear clothes that are easy to get on and off and wear slip-on shoes.  Thank you for allowing [place] to care for you!   -- [facility]

## 2020-05-15 NOTE — Progress Notes (Signed)
Cardiology Office Note:    Date:  05/15/2020   ID:  Alexis [name], DOB 1945-09-09, MRN [id number]  PCP:  Alycia Rossetti, MD  Cardiologist:  Buford Dresser, MD  Referring MD: Alycia Rossetti, MD   CC: follow up  History of Present Illness:    Alexis [name] is a 76 y.o. female with a hx of PAD (carotid artery disease, subclavian artery stenosis, claudication), hypertension, asthma, hyperlipidemia who is seen for follow up today. I initially met her 05/09/19 as a new consult at the request of [place], Modena Nunnery, MD for the evaluation and management of carotid bruit. She was last seen by Dr. Gwenlyn Found in 2015.  Cardiovascular risk factors: Prior clinical ASCVD: no MI or CVA. Does have prior history of PAD Comorbid conditions: endorses hypertension, hyperlipidemia. Denies diabetes, chronic kidney disease:  Metabolic syndrome/Obesity: BMI 20 Chronic inflammatory conditions: none, though has had intermittent steroids for eczema Tobacco use history: quit ~2004-2005, quit cold [place]. Was 2 ppd smoker for >40 years. Family history: father had MI, had angioplasty, no damage to his heart. Mother without heart issues. Brother is in his late 36s, had massive MI (silent) and developed heart failure. Had 5V CABG, also found to have AAA. Multiple distant family members on her mother's side with heart issues.  Prior cardiac testing and/or incidental findings on other testing (ie coronary calcium): echo 2012, vascular studies reviewed. No stress test, no heart cath. Prior stenting of iliac arteries in 2012. Exercise level: no intentional activity. Able to walk, climb stairs. Does house work without issue. Current diet: likes chicken, fruits/vegetables. Tries to avoid fried foods.  Today: Has been getting more short of breath with exertion. Notices she is short of breath even after bathing. Hasn't been out much due to Covid, but notes that she is short of breath bending over or vacuuming. Gets mild  chest tightness with exertion, like an ache. Has been more often recently. About once a week. Not always exertional, sometimes when she is at rest. Very brief, self resolves. No other associated symptoms.   We discussed lexiscan myoview (had in the past, felt terrible, and now with carotid disease not recommended). She does not think she can treadmill, so stress echo not ideal. We discussed cath today. Was able to get to iliacs via the right wrist in the past in 2012 with Dr. Gwenlyn Found.  Had a lot of dizziness six months ago, having inner ear problems. Improved.  Here urgently due to vascular studies done yesterday. She denies any neurologic symptoms. Reviewed results of her carotid studies and red flag warning signs that need immediate medical attention.  Denies PND, orthopnea, LE edema or unexpected weight gain. No syncope or palpitations.  Past Medical History:  Diagnosis Date  . Anxiety   . Claudication ([place])    stent in bil iliac 11/25/10  . GERD (gastroesophageal reflux disease)   . HTN (hypertension)   . Hyperlipemia   . Hypothyroidism   . Murmur, cardiac    echo 10/12/10- EF >55%, mod sclerotic aortic valve  . PVD (peripheral vascular disease) ([place])    lower ext and upper ext-L subclavian stenosis by ultrasound, nl nuclear test- 10/27/10  . Tobacco abuse   . Vitamin D deficiency     Past Surgical History:  Procedure Laterality Date  . peripheral angio  11/25/10   right iliac-6x27 Express balloon expandable stent; Left iliac 6x37    Current Medications: Current Outpatient Medications on File Prior to Visit  Medication Sig  .  albuterol (VENTOLIN HFA) 108 (90 Base) MCG/ACT inhaler INHALE 2 PUFFS INTO THE LUNGS EVERY 6 (SIX) HOURS AS NEEDED FOR WHEEZING  . aspirin 81 MG tablet Take 81 mg by mouth daily.  . atorvastatin (LIPITOR) 40 MG tablet Take 1 tablet (40 mg total) by mouth daily.  . benazepril (LOTENSIN) 40 MG tablet TAKE 1 TABLET BY MOUTH ONCE A DAY  . cholecalciferol  (VITAMIN D) 1000 UNITS tablet Take 2,000 Units by mouth daily.  . citalopram (CELEXA) 40 MG tablet TAKE 1 TABLET BY MOUTH ONCE A DAY *NEED APPT FOR MORE REFILLS*  . esomeprazole (NEXIUM) 20 MG capsule Take 20 mg by mouth daily at 12 noon.  . levothyroxine (SYNTHROID) 50 MCG tablet Take 1 tablet (50 mcg total) by mouth daily.  . levothyroxine (SYNTHROID) 75 MCG tablet TAKE 1 TABLET BY MOUTH ONCE A DAY  . triamcinolone cream (KENALOG) 0.1 % Apply 1 application topically 2 (two) times daily.   No current facility-administered medications on file prior to visit.     Allergies:   Banana, Chicken allergy, Demerol [meperidine], Eggs or egg-derived products, Metoprolol, Milk-related compounds, Orange fruit [citrus], Other, Pineapple, Salmon [fish allergy], and Penicillins   Social History   Tobacco Use  . Smoking status: Former Smoker    Quit date: 02/28/2002    Years since quitting: 18.2  . Smokeless tobacco: Never Used  Vaping Use  . Vaping Use: Never used  Substance Use Topics  . Alcohol use: No  . Drug use: No    Family History: family history includes Allergic rhinitis in her father, maternal grandmother, and paternal grandmother; Asthma in her maternal grandfather; Urticaria in her maternal grandmother. There is no history of Angioedema, Eczema, or Immunodeficiency.  ROS:   Please see the history of present illness.  Additional pertinent ROS otherwise unremarkable.  EKGs/Labs/Other Studies Reviewed:    The following studies were reviewed today: Carotids 05/14/20  Summary:  Right Carotid: Velocities in the right ICA are consistent with a 60-79% stenosis, these have increased since prior exam. The ECA appears        near occlusion.   Left Carotid: Velocities in the left ICA are consistent with a 40-59% stenosis. ICA velocities may be underestimated due to more proximal disease.        Hemodynamically significant plaque >50% visualized in the  CCA. The ECA appears  >50% stenosed.   Vertebrals: Right vertebral artery demonstrates antegrade flow. Left  vertebral artery demonstrates bidirectional flow.  Subclavians: Right subclavian artery was stenotic and disturbed. Left  subclavian has atypical, monophasic flow. Abnormal left vertebral flow  and a left 60 mmHg lower brachial pressure is indicative of left subclavian steal syndrome.   Peripheral vascular angiography and intervention 11/04/2010 and 11/05/2010 ANGIOGRAPHIC RESULT: 1. Abdominal aorta;     a.     Renal arteries - 40% left renal artery stenosis. 2. Left lower extremity;     a.     60% segmental ostial size proximal left common iliac artery      stenosis.     b.     95-99% stenosis of the mid left common iliac artery.     c.     50% stenosis of the left common femoral artery.     d.     50% segmental mid left SFA stenosis with two-vessel runoff. 3. The posterior tibial was occluded with both sides. 4. Right lower extremity;     a.     95% proximal right common iliac   artery stenosis.     b.     Two-vessel runoff an occluded posterior tibial.  IMPRESSION:  Ms. Wohlers has high-grade bilateral iliac disease responsible for decreased ABIs and claudication.  I believe she is a candidate for PTA and stenting.  DESCRIPTION OF PROCEDURE:  The patient brought to the second floor [facility] in the post absorptive state.  She was premedicated p.o. Valium, IV Versed, and IV fentanyl.  Her right left groins were prepped and shaved in usual sterile fashion.  1% Xylocaine was used for local anesthesia.  6-French bright tip long sheath were inserted into left femoral arteries using standard Seldinger technique under fluoroscopic control.  The patient received a total 55 liters of heparin intravenously with an ACT of 226.  Total 71 mL of contrast was administered during the case.  Pre-dilatation was performed over _______ wires bilaterally with a 4 x 2 balloon.  Stenting was  performed on the right with a 6 x 27 Express stainless steel balloon expandable stent and on the left with a 6 x 37 nominal pressures.  Postdilatation performed using "kissing balloon technique with a 6 x 2 balloon at the origin resulting reduction of 90% bilateral iliac stenosis to 0% residual with excellent result.  The patient tolerated the procedure well.  ACT was measured.  The sheath will be removed and pressure will be held regarding the sheath hemostasis.  The patient received 300 mg of clopidogrel.  She will be hydrated overnight, discharged on the morning. She has followup Dopplers and ABIs.  I will see back in followup.  EKG:  EKG is personally reviewed.  The ekg ordered today demonstrates sinus rhythm, iRBBB at 78 bpm  Recent Labs: 11/26/2019: ALT 10; BUN 13; Creat 0.77; Hemoglobin 11.4; Platelets 334; Potassium 4.6; Sodium 142; TSH 0.81  Recent Lipid Panel    Component Value Date/Time   CHOL 154 11/26/2019 1153   TRIG 121 11/26/2019 1153   HDL 55 11/26/2019 1153   CHOLHDL 2.8 11/26/2019 1153   VLDL 23 12/03/2015 1212   LDLCALC 79 11/26/2019 1153    Physical Exam:    VS:  BP (!) 121/59   Pulse 78   Ht 4' 11" (1.499 m)   Wt 97 lb (44 kg)   SpO2 100%   BMI 19.59 kg/m     Wt Readings from Last 3 Encounters:  05/15/20 97 lb (44 kg)  11/26/19 96 lb 12.8 oz (43.9 kg)  05/09/19 102 lb 3.2 oz (46.4 kg)    GEN: Well nourished, well developed in no acute distress HEENT: Normal, moist mucous membranes NECK: No JVD CARDIAC: regular rhythm, normal S1 and S2, no rubs or gallops. 2/6 systolic murmur. VASCULAR: Radial and DP pulses trace to 1+ bilaterally. bilateral carotid bruits RESPIRATORY:  Clear to auscultation without rales, wheezing or rhonchi  ABDOMEN: Soft, non-tender, non-distended MUSCULOSKELETAL:  Ambulates independently SKIN: Warm and dry, no edema NEUROLOGIC:  Alert and oriented x 3. No focal neuro deficits noted. PSYCHIATRIC:  Normal affect   ASSESSMENT:     1. Chest pain of uncertain etiology   2. Bilateral carotid artery stenosis   3. Bruit   4. PAD (peripheral artery disease) (HCC)   5. Subclavian artery stenosis, left (HCC)   6. Essential hypertension   7. Hyperlipidemia, unspecified hyperlipidemia type   8. Pre-procedure lab exam   9. SOB (shortness of breath)    PLAN:    Chest pain, dyspnea on exertion: highly concerning for angina, with   high probability given fisk factors and known ASCVD: -we discussed options for stress testing vs. Cath, see HPI -given contraindications to lexiscan (high grade carotid disease) and inability to treadmill (treadmill myoview or stress echo) and high probability, we discussed proceeding with cardiac catheterization.  Risks and benefits of cardiac catheterization have been discussed with the patient.  These include bleeding, infection, kidney damage, stroke, heart attack, death.  The patient understands these risks and is willing to proceed. -given her extensive vascular disease, I feel she would benefit from having cath performed by Dr. Gwenlyn Found, who has done her prior interventions. We will schedule this.  -pre cath labs ordered -echo ordered  PAD: carotid disease, subclavian stenosis, history of claudication s/p bilateral iliac stenting 2012 -denies any neurologic symptoms, but has progressive carotid stenosis based on imaging studies yesterday -bilateral carotid bruits -will refer to vascular surgery for evaluation -continue aspirin, statin -counseled on red flag warning signs that need immediate medical attention -discussed risk factor management  Hypertension: goal <130/80, at goal today -has subclavian disease, confounds BP measurements -continue benazepril 40 mg -she will call if BP numbers at home are consistently elevated  Hyperlipidemia: LDL goal <70 given PAD -recent lipids from 11/06/19 reviewed. LDL 79, improved but above goal. On atorvastatin 40 mg, recheck lipids today  Cardiac risk  counseling and prevention recommendations: -recommend heart healthy/Mediterranean diet, with whole grains, fruits, vegetable, fish, lean meats, nuts, and olive oil. Limit salt. -recommend moderate walking, 3-5 times/week for 30-50 minutes each session. Aim for at least 150 minutes.week. Goal should be pace of 3 miles/hours, or walking 1.5 miles in 30 minutes -recommend avoidance of tobacco products. Avoid excess alcohol.  Plan for follow up: 4-6 weeks, cath next week  High complexity care, needed to arrange for cath and referrals given high risk symptoms and risk factors.  Buford Dresser, MD, PhD, [facility]    Medication Adjustments/Labs and Tests Ordered: Current medicines are reviewed at length with the patient today.  Concerns regarding medicines are outlined above.  Orders Placed This Encounter  Procedures  . CBC  . Lipid panel  . Basic metabolic panel  . Ambulatory referral to Vascular Surgery  . EKG 12-Lead  . ECHOCARDIOGRAM COMPLETE   No orders of the defined types were placed in this encounter.   Patient Instructions  Medication Instructions:  Your Physician recommend you continue on your current medication as directed.    *If you need a refill on your cardiac medications before your next appointment, please call your pharmacy*   Lab Work: Your physician recommends lab work today (CBC, BMP, Lipid).  If you have labs (blood work) drawn today and your tests are completely normal, you will receive your results only by: Marland Kitchen MyChart Message (if you have MyChart) OR . A paper copy in the mail If you have any lab test that is abnormal or we need to change your treatment, we will call you to review the results.   Testing/Procedures: Your physician has requested that you have an echocardiogram. Echocardiography is a painless test that uses sound waves to create images of your heart. It provides your doctor with information about the size and  shape of your heart and how well your heart's chambers and valves are working. This procedure takes approximately one hour. There are no restrictions for this procedure. [facility] has requested that you have a cardiac catheterization. Cardiac catheterization is used to diagnose and/or treat  various heart conditions. Doctors may recommend this procedure for a number of different reasons. The most common reason is to evaluate chest pain. Chest pain can be a symptom of coronary artery disease (CAD), and cardiac catheterization can show whether plaque is narrowing or blocking your heart's arteries. This procedure is also used to evaluate the valves, as well as measure the blood flow and oxygen levels in different parts of your heart. For further information please visit [email or web address]. Please follow instruction sheet, as given. [facility]  You will need to have the coronavirus test completed prior to your procedure. An appointment has been made at 11:25 on Monday 05/18/20. This is a Drive Up Visit at [address], [place], [place] [address]. Please tell them that you are there for procedure testing. Stay in your car and someone will be with you shortly. Please make sure to have all other labs completed before this test because you will need to stay quarantined until your procedure.     Follow-Up: At [facility], you and your health needs are our priority.  As part of our continuing mission to provide you with exceptional heart care, we have created designated Provider Care Teams.  These Care Teams include your primary Cardiologist (physician) and Advanced Practice Providers (APPs -  Physician Assistants and Nurse Practitioners) who all work together to provide you with the care you need, when you need it.  We recommend signing up for the patient portal called "MyChart".  Sign up information is provided on this After Visit Summary.  MyChart is used  to connect with patients for Virtual Visits (Telemedicine).  Patients are able to view lab/test results, encounter notes, upcoming appointments, etc.  Non-urgent messages can be sent to your provider as well.   To learn more about what you can do with MyChart, go to [email or web address].    Your next appointment:   4-6 week(s)  The format for your next appointment:   In Person  Provider:   Buford Dresser, MD      [address] [place] [address] Dept: [phone] Loc: [place]  05/15/2020  You are scheduled for a Cardiac Catheterization on Thursday, March 24 with Dr. Quay Burow.  1. Please arrive at the [facility] (Main Entrance A) at [facility]: [address] [place], [place] [address] a 7:30 am (This time is two hours before your procedure to ensure your preparation). Free valet parking service is available.   Special note: Every effort is made to have your procedure done on time. Please understand that emergencies sometimes delay scheduled procedures.  2. Diet: Do not eat solid foods after midnight.  The patient may have clear liquids until 5am upon the day of the procedure.  3. Labs: You will need to have blood drawn today (CBC, BMP, Lipid)  4. Medication instructions in preparation for your procedure:   Contrast Allergy: No   On the morning of your procedure, take your Aspirin and any morning medicines NOT listed above.  You may use sips of water.  5. Plan for one night stay--bring personal belongings. 6. Bring a current list of your medications and current insurance cards. 7. You MUST have a responsible person to drive you home. 8. Someone MUST be with you the first 24 hours after you arrive home or your discharge will be delayed. 9. Please wear clothes that are easy to get on and  off and wear slip-on shoes.  Thank you  for allowing [place] to care for you!   -- [facility]     Signed, Buford Dresser, MD PhD 05/15/2020   [place]

## 2020-05-15 NOTE — H&P (View-Only) (Signed)
Cardiology Office Note:    Date:  05/15/2020   ID:  Alexis [name], DOB [birth year], MRN [id number]  PCP:  Alycia Rossetti, MD  Cardiologist:  Buford Dresser, MD  Referring MD: Alycia Rossetti, MD   CC: follow up  History of Present Illness:    Alexis [name] is a 75 y.o. female with a hx of PAD (carotid artery disease, subclavian artery stenosis, claudication), hypertension, asthma, hyperlipidemia who is seen for follow up today. I initially met her 05/09/19 as a new consult at the request of [place], Modena Nunnery, MD for the evaluation and management of carotid bruit. She was last seen by Dr. Gwenlyn Found in 2015.  Cardiovascular risk factors: Prior clinical ASCVD: no MI or CVA. Does have prior history of PAD Comorbid conditions: endorses hypertension, hyperlipidemia. Denies diabetes, chronic kidney disease:  Metabolic syndrome/Obesity: BMI 20 Chronic inflammatory conditions: none, though has had intermittent steroids for eczema Tobacco use history: quit ~2004-2005, quit cold [place]. Was 2 ppd smoker for >40 years. Family history: father had MI, had angioplasty, no damage to his heart. Mother without heart issues. Brother is in his late 54s, had massive MI (silent) and developed heart failure. Had 5V CABG, also found to have AAA. Multiple distant family members on her mother's side with heart issues.  Prior cardiac testing and/or incidental findings on other testing (ie coronary calcium): echo 2012, vascular studies reviewed. No stress test, no heart cath. Prior stenting of iliac arteries in 2012. Exercise level: no intentional activity. Able to walk, climb stairs. Does house work without issue. Current diet: likes chicken, fruits/vegetables. Tries to avoid fried foods.  Today: Has been getting more short of breath with exertion. Notices she is short of breath even after bathing. Hasn't been out much due to Covid, but notes that she is short of breath bending over or vacuuming. Gets mild  chest tightness with exertion, like an ache. Has been more often recently. About once a week. Not always exertional, sometimes when she is at rest. Very brief, self resolves. No other associated symptoms.   We discussed lexiscan myoview (had in the past, felt terrible, and now with carotid disease not recommended). She does not think she can treadmill, so stress echo not ideal. We discussed cath today. Was able to get to iliacs via the right wrist in the past in 2012 with Dr. Gwenlyn Found.  Had a lot of dizziness six months ago, having inner ear problems. Improved.  Here urgently due to vascular studies done yesterday. She denies any neurologic symptoms. Reviewed results of her carotid studies and red flag warning signs that need immediate medical attention.  Denies PND, orthopnea, LE edema or unexpected weight gain. No syncope or palpitations.  Past Medical History:  Diagnosis Date  . Anxiety   . Claudication ([place])    stent in bil iliac 11/25/10  . GERD (gastroesophageal reflux disease)   . HTN (hypertension)   . Hyperlipemia   . Hypothyroidism   . Murmur, cardiac    echo 10/12/10- EF >55%, mod sclerotic aortic valve  . PVD (peripheral vascular disease) ([place])    lower ext and upper ext-L subclavian stenosis by ultrasound, nl nuclear test- 10/27/10  . Tobacco abuse   . Vitamin D deficiency     Past Surgical History:  Procedure Laterality Date  . peripheral angio  11/25/10   right iliac-6x27 Express balloon expandable stent; Left iliac 6x37    Current Medications: Current Outpatient Medications on File Prior to Visit  Medication Sig  .  albuterol (VENTOLIN HFA) 108 (90 Base) MCG/ACT inhaler INHALE 2 PUFFS INTO THE LUNGS EVERY 6 (SIX) HOURS AS NEEDED FOR WHEEZING  . aspirin 81 MG tablet Take 81 mg by mouth daily.  Marland Kitchen atorvastatin (LIPITOR) 40 MG tablet Take 1 tablet (40 mg total) by mouth daily.  . benazepril (LOTENSIN) 40 MG tablet TAKE 1 TABLET BY MOUTH ONCE A DAY  . cholecalciferol  (VITAMIN D) 1000 UNITS tablet Take 2,000 Units by mouth daily.  . citalopram (CELEXA) 40 MG tablet TAKE 1 TABLET BY MOUTH ONCE A DAY *NEED APPT FOR MORE REFILLS*  . esomeprazole (NEXIUM) 20 MG capsule Take 20 mg by mouth daily at 12 noon.  Marland Kitchen levothyroxine (SYNTHROID) 50 MCG tablet Take 1 tablet (50 mcg total) by mouth daily.  Marland Kitchen levothyroxine (SYNTHROID) 75 MCG tablet TAKE 1 TABLET BY MOUTH ONCE A DAY  . triamcinolone cream (KENALOG) 0.1 % Apply 1 application topically 2 (two) times daily.   No current facility-administered medications on file prior to visit.     Allergies:   Banana, Chicken allergy, Demerol [meperidine], Eggs or egg-derived products, Metoprolol, Milk-related compounds, Orange fruit [citrus], Other, Pineapple, Salmon [fish allergy], and Penicillins   Social History   Tobacco Use  . Smoking status: Former Smoker    Quit date: 02/28/2002    Years since quitting: 18.2  . Smokeless tobacco: Never Used  Vaping Use  . Vaping Use: Never used  Substance Use Topics  . Alcohol use: No  . Drug use: No    Family History: family history includes Allergic rhinitis in her father, maternal grandmother, and paternal grandmother; Asthma in her maternal grandfather; Urticaria in her maternal grandmother. There is no history of Angioedema, Eczema, or Immunodeficiency.  ROS:   Please see the history of present illness.  Additional pertinent ROS otherwise unremarkable.  EKGs/Labs/Other Studies Reviewed:    The following studies were reviewed today: Carotids 05/14/20  Summary:  Right Carotid: Velocities in the right ICA are consistent with a 60-79% stenosis, these have increased since prior exam. The ECA appears        near occlusion.   Left Carotid: Velocities in the left ICA are consistent with a 40-59% stenosis. ICA velocities may be underestimated due to more proximal disease.        Hemodynamically significant plaque >50% visualized in the  CCA. The ECA appears  >50% stenosed.   Vertebrals: Right vertebral artery demonstrates antegrade flow. Left  vertebral artery demonstrates bidirectional flow.  Subclavians: Right subclavian artery was stenotic and disturbed. Left  subclavian has atypical, monophasic flow. Abnormal left vertebral flow  and a left 60 mmHg lower brachial pressure is indicative of left subclavian steal syndrome.   Peripheral vascular angiography and intervention 11/04/2010 and 11/05/2010 ANGIOGRAPHIC RESULT: 1. Abdominal aorta;     a.     Renal arteries - 40% left renal artery stenosis. 2. Left lower extremity;     a.     60% segmental ostial size proximal left common iliac artery      stenosis.     b.     95-99% stenosis of the mid left common iliac artery.     c.     50% stenosis of the left common femoral artery.     d.     50% segmental mid left SFA stenosis with two-vessel runoff. 3. The posterior tibial was occluded with both sides. 4. Right lower extremity;     a.     95% proximal right common iliac  artery stenosis.     b.     Two-vessel runoff an occluded posterior tibial.  IMPRESSION:  Ms. Tolosa has high-grade bilateral iliac disease responsible for decreased ABIs and claudication.  I believe she is a candidate for PTA and stenting.  DESCRIPTION OF PROCEDURE:  The patient brought to the second floor [facility] in the post absorptive state.  She was premedicated p.o. Valium, IV Versed, and IV fentanyl.  Her right left groins were prepped and shaved in usual sterile fashion.  1% Xylocaine was used for local anesthesia.  6-French bright tip long sheath were inserted into left femoral arteries using standard Seldinger technique under fluoroscopic control.  The patient received a total 55 liters of heparin intravenously with an ACT of 226.  Total 71 mL of contrast was administered during the case.  Pre-dilatation was performed over _______ wires bilaterally with a 4 x 2 balloon.  Stenting was  performed on the right with a 6 x 27 Express stainless steel balloon expandable stent and on the left with a 6 x 37 nominal pressures.  Postdilatation performed using "kissing balloon technique with a 6 x 2 balloon at the origin resulting reduction of 90% bilateral iliac stenosis to 0% residual with excellent result.  The patient tolerated the procedure well.  ACT was measured.  The sheath will be removed and pressure will be held regarding the sheath hemostasis.  The patient received 300 mg of clopidogrel.  She will be hydrated overnight, discharged on the morning. She has followup Dopplers and ABIs.  I will see back in followup.  EKG:  EKG is personally reviewed.  The ekg ordered today demonstrates sinus rhythm, iRBBB at 78 bpm  Recent Labs: 11/26/2019: ALT 10; BUN 13; Creat 0.77; Hemoglobin 11.4; Platelets 334; Potassium 4.6; Sodium 142; TSH 0.81  Recent Lipid Panel    Component Value Date/Time   CHOL 154 11/26/2019 1153   TRIG 121 11/26/2019 1153   HDL 55 11/26/2019 1153   CHOLHDL 2.8 11/26/2019 1153   VLDL 23 12/03/2015 1212   LDLCALC 79 11/26/2019 1153    Physical Exam:    VS:  BP (!) 121/59   Pulse 78   Ht 4' 11" (1.499 m)   Wt 97 lb (44 kg)   SpO2 100%   BMI 19.59 kg/m     Wt Readings from Last 3 Encounters:  05/15/20 97 lb (44 kg)  11/26/19 96 lb 12.8 oz (43.9 kg)  05/09/19 102 lb 3.2 oz (46.4 kg)    GEN: Well nourished, well developed in no acute distress HEENT: Normal, moist mucous membranes NECK: No JVD CARDIAC: regular rhythm, normal S1 and S2, no rubs or gallops. 2/6 systolic murmur. VASCULAR: Radial and DP pulses trace to 1+ bilaterally. bilateral carotid bruits RESPIRATORY:  Clear to auscultation without rales, wheezing or rhonchi  ABDOMEN: Soft, non-tender, non-distended MUSCULOSKELETAL:  Ambulates independently SKIN: Warm and dry, no edema NEUROLOGIC:  Alert and oriented x 3. No focal neuro deficits noted. PSYCHIATRIC:  Normal affect   ASSESSMENT:     1. Chest pain of uncertain etiology   2. Bilateral carotid artery stenosis   3. Bruit   4. PAD (peripheral artery disease) ([place])   5. Subclavian artery stenosis, left (HCC)   6. Essential hypertension   7. Hyperlipidemia, unspecified hyperlipidemia type   8. Pre-procedure lab exam   9. SOB (shortness of breath)    PLAN:    Chest pain, dyspnea on exertion: highly concerning for angina, with  high probability given fisk factors and known ASCVD: -we discussed options for stress testing vs. Cath, see HPI -given contraindications to lexiscan (high grade carotid disease) and inability to treadmill (treadmill myoview or stress echo) and high probability, we discussed proceeding with cardiac catheterization.  Risks and benefits of cardiac catheterization have been discussed with the patient.  These include bleeding, infection, kidney damage, stroke, heart attack, death.  The patient understands these risks and is willing to proceed. -given her extensive vascular disease, I feel she would benefit from having cath performed by Dr. Gwenlyn Found, who has done her prior interventions. We will schedule this.  -pre cath labs ordered -echo ordered  PAD: carotid disease, subclavian stenosis, history of claudication s/p bilateral iliac stenting 2012 -denies any neurologic symptoms, but has progressive carotid stenosis based on imaging studies yesterday -bilateral carotid bruits -will refer to vascular surgery for evaluation -continue aspirin, statin -counseled on red flag warning signs that need immediate medical attention -discussed risk factor management  Hypertension: goal <130/80, at goal today -has subclavian disease, confounds BP measurements -continue benazepril 40 mg -she will call if BP numbers at home are consistently elevated  Hyperlipidemia: LDL goal <70 given PAD -recent lipids from 11/06/19 reviewed. LDL 79, improved but above goal. On atorvastatin 40 mg, recheck lipids today  Cardiac risk  counseling and prevention recommendations: -recommend heart healthy/Mediterranean diet, with whole grains, fruits, vegetable, fish, lean meats, nuts, and olive oil. Limit salt. -recommend moderate walking, 3-5 times/week for 30-50 minutes each session. Aim for at least 150 minutes.week. Goal should be pace of 3 miles/hours, or walking 1.5 miles in 30 minutes -recommend avoidance of tobacco products. Avoid excess alcohol.  Plan for follow up: 4-6 weeks, cath next week  High complexity care, needed to arrange for cath and referrals given high risk symptoms and risk factors.  Buford Dresser, MD, PhD, [facility]    Medication Adjustments/Labs and Tests Ordered: Current medicines are reviewed at length with the patient today.  Concerns regarding medicines are outlined above.  Orders Placed This Encounter  Procedures  . CBC  . Lipid panel  . Basic metabolic panel  . Ambulatory referral to Vascular Surgery  . EKG 12-Lead  . ECHOCARDIOGRAM COMPLETE   No orders of the defined types were placed in this encounter.   Patient Instructions  Medication Instructions:  Your Physician recommend you continue on your current medication as directed.    *If you need a refill on your cardiac medications before your next appointment, please call your pharmacy*   Lab Work: Your physician recommends lab work today (CBC, BMP, Lipid).  If you have labs (blood work) drawn today and your tests are completely normal, you will receive your results only by: Marland Kitchen MyChart Message (if you have MyChart) OR . A paper copy in the mail If you have any lab test that is abnormal or we need to change your treatment, we will call you to review the results.   Testing/Procedures: Your physician has requested that you have an echocardiogram. Echocardiography is a painless test that uses sound waves to create images of your heart. It provides your doctor with information about the size and  shape of your heart and how well your heart's chambers and valves are working. This procedure takes approximately one hour. There are no restrictions for this procedure. [facility] has requested that you have a cardiac catheterization. Cardiac catheterization is used to diagnose and/or treat  various heart conditions. Doctors may recommend this procedure for a number of different reasons. The most common reason is to evaluate chest pain. Chest pain can be a symptom of coronary artery disease (CAD), and cardiac catheterization can show whether plaque is narrowing or blocking your heart's arteries. This procedure is also used to evaluate the valves, as well as measure the blood flow and oxygen levels in different parts of your heart. For further information please visit [email or web address]. Please follow instruction sheet, as given. [facility]  You will need to have the coronavirus test completed prior to your procedure. An appointment has been made at 11:25 on Monday 05/18/20. This is a Drive Up Visit at [address], [place], [place] [address]. Please tell them that you are there for procedure testing. Stay in your car and someone will be with you shortly. Please make sure to have all other labs completed before this test because you will need to stay quarantined until your procedure.     Follow-Up: At [facility], you and your health needs are our priority.  As part of our continuing mission to provide you with exceptional heart care, we have created designated Provider Care Teams.  These Care Teams include your primary Cardiologist (physician) and Advanced Practice Providers (APPs -  Physician Assistants and Nurse Practitioners) who all work together to provide you with the care you need, when you need it.  We recommend signing up for the patient portal called "MyChart".  Sign up information is provided on this After Visit Summary.  MyChart is used  to connect with patients for Virtual Visits (Telemedicine).  Patients are able to view lab/test results, encounter notes, upcoming appointments, etc.  Non-urgent messages can be sent to your provider as well.   To learn more about what you can do with MyChart, go to [email or web address].    Your next appointment:   4-6 week(s)  The format for your next appointment:   In Person  Provider:   Buford Dresser, MD      [address] [place] [address] Dept: [phone] Loc: [place]  05/15/2020  You are scheduled for a Cardiac Catheterization on Thursday, March 24 with Dr. Quay Burow.  1. Please arrive at the [facility] (Main Entrance A) at [facility]: [address] [place], [place] [address] a 7:30 am (This time is two hours before your procedure to ensure your preparation). Free valet parking service is available.   Special note: Every effort is made to have your procedure done on time. Please understand that emergencies sometimes delay scheduled procedures.  2. Diet: Do not eat solid foods after midnight.  The patient may have clear liquids until 5am upon the day of the procedure.  3. Labs: You will need to have blood drawn today (CBC, BMP, Lipid)  4. Medication instructions in preparation for your procedure:   Contrast Allergy: No   On the morning of your procedure, take your Aspirin and any morning medicines NOT listed above.  You may use sips of water.  5. Plan for one night stay--bring personal belongings. 6. Bring a current list of your medications and current insurance cards. 7. You MUST have a responsible person to drive you home. 8. Someone MUST be with you the first 24 hours after you arrive home or your discharge will be delayed. 9. Please wear clothes that are easy to get on and  off and wear slip-on shoes.  Thank you  for allowing [place] to care for you!   -- [facility]     Signed, Buford Dresser, MD PhD 05/15/2020   [place]

## 2020-05-16 LAB — BASIC METABOLIC PANEL
BUN/Creatinine Ratio: 21 (ref 12–28)
BUN: 16 mg/dL (ref 8–27)
CO2: 26 mmol/L (ref 20–29)
Calcium: 9.5 mg/dL (ref 8.7–10.3)
Chloride: 104 mmol/L (ref 96–106)
Creatinine, Ser: 0.76 mg/dL (ref 0.57–1.00)
Glucose: 76 mg/dL (ref 65–99)
Potassium: 4.5 mmol/L (ref 3.5–5.2)
Sodium: 145 mmol/L — ABNORMAL HIGH (ref 134–144)
eGFR: 81 mL/min/{1.73_m2} (ref >59)

## 2020-05-16 LAB — LIPID PANEL
Chol/HDL Ratio: 2.5 ratio (ref 0.0–4.4)
Cholesterol, Total: 135 mg/dL (ref 100–199)
HDL: 53 mg/dL (ref >39)
LDL Chol Calc (NIH): 65 mg/dL (ref 0–99)
Triglycerides: 86 mg/dL (ref 0–149)
VLDL Cholesterol Cal: 17 mg/dL (ref 5–40)

## 2020-05-16 LAB — CBC
Hematocrit: 31.9 % — ABNORMAL LOW (ref 34.0–46.6)
Hemoglobin: 9.9 g/dL — ABNORMAL LOW (ref 11.1–15.9)
MCH: 27.3 pg (ref 26.6–33.0)
MCHC: 31 g/dL — ABNORMAL LOW (ref 31.5–35.7)
MCV: 88 fL (ref 79–97)
Platelets: 331 10*3/uL (ref 150–450)
RBC: 3.63 x10E6/uL — ABNORMAL LOW (ref 3.77–5.28)
RDW: 14.2 % (ref 11.7–15.4)
WBC: 6.4 10*3/uL (ref 3.4–10.8)

## 2020-05-18 ENCOUNTER — Other Ambulatory Visit (HOSPITAL_COMMUNITY)
Admission: RE | Admit: 2020-05-18 | Discharge: 2020-05-18 | Disposition: A | Discharge status: Home / Self Care | Payer: Medicare Other | Source: Ambulatory Visit | Attending: Cardiovascular Disease | Admitting: Cardiovascular Disease

## 2020-05-18 DIAGNOSIS — K219 Gastro-esophageal reflux disease without esophagitis: Secondary | ICD-10-CM | POA: Diagnosis not present

## 2020-05-18 DIAGNOSIS — E8881 Metabolic syndrome: Secondary | ICD-10-CM | POA: Diagnosis present

## 2020-05-18 DIAGNOSIS — Z20822 Contact with and (suspected) exposure to covid-19: Secondary | ICD-10-CM | POA: Insufficient documentation

## 2020-05-18 DIAGNOSIS — Z01812 Encounter for preprocedural laboratory examination: Secondary | ICD-10-CM | POA: Insufficient documentation

## 2020-05-18 DIAGNOSIS — E785 Hyperlipidemia, unspecified: Secondary | ICD-10-CM | POA: Diagnosis not present

## 2020-05-18 DIAGNOSIS — Z888 Allergy status to other drugs, medicaments and biological substances status: Secondary | ICD-10-CM | POA: Diagnosis not present

## 2020-05-18 DIAGNOSIS — Z79899 Other long term (current) drug therapy: Secondary | ICD-10-CM | POA: Diagnosis not present

## 2020-05-18 DIAGNOSIS — Z880 Allergy status to penicillin: Secondary | ICD-10-CM | POA: Diagnosis not present

## 2020-05-18 DIAGNOSIS — Z87891 Personal history of nicotine dependence: Secondary | ICD-10-CM | POA: Diagnosis not present

## 2020-05-18 DIAGNOSIS — F419 Anxiety disorder, unspecified: Secondary | ICD-10-CM | POA: Diagnosis present

## 2020-05-18 DIAGNOSIS — Z91011 Allergy to milk products: Secondary | ICD-10-CM | POA: Diagnosis not present

## 2020-05-18 DIAGNOSIS — I10 Essential (primary) hypertension: Secondary | ICD-10-CM | POA: Diagnosis not present

## 2020-05-18 DIAGNOSIS — I25118 Atherosclerotic heart disease of native coronary artery with other forms of angina pectoris: Secondary | ICD-10-CM | POA: Diagnosis not present

## 2020-05-18 DIAGNOSIS — D649 Anemia, unspecified: Secondary | ICD-10-CM | POA: Diagnosis not present

## 2020-05-18 DIAGNOSIS — E559 Vitamin D deficiency, unspecified: Secondary | ICD-10-CM | POA: Diagnosis not present

## 2020-05-18 DIAGNOSIS — Z7982 Long term (current) use of aspirin: Secondary | ICD-10-CM | POA: Diagnosis not present

## 2020-05-18 DIAGNOSIS — Z8249 Family history of ischemic heart disease and other diseases of the circulatory system: Secondary | ICD-10-CM | POA: Diagnosis not present

## 2020-05-18 DIAGNOSIS — E039 Hypothyroidism, unspecified: Secondary | ICD-10-CM | POA: Diagnosis not present

## 2020-05-18 DIAGNOSIS — I739 Peripheral vascular disease, unspecified: Secondary | ICD-10-CM | POA: Diagnosis not present

## 2020-05-18 DIAGNOSIS — L309 Dermatitis, unspecified: Secondary | ICD-10-CM | POA: Diagnosis not present

## 2020-05-18 DIAGNOSIS — I6523 Occlusion and stenosis of bilateral carotid arteries: Secondary | ICD-10-CM | POA: Diagnosis not present

## 2020-05-18 DIAGNOSIS — Z79890 Hormone replacement therapy (postmenopausal): Secondary | ICD-10-CM | POA: Diagnosis not present

## 2020-05-19 ENCOUNTER — Telehealth: Payer: Self-pay

## 2020-05-19 LAB — SARS CORONAVIRUS 2 (TAT 6-24 HRS): SARS Coronavirus 2: NEGATIVE

## 2020-05-19 NOTE — Telephone Encounter (Signed)
Opened in error

## 2020-05-20 ENCOUNTER — Other Ambulatory Visit: Payer: Self-pay | Admitting: *Deleted

## 2020-05-20 ENCOUNTER — Encounter: Payer: Self-pay | Admitting: Cardiology

## 2020-05-20 ENCOUNTER — Telehealth: Payer: Self-pay | Admitting: *Deleted

## 2020-05-20 DIAGNOSIS — D582 Other hemoglobinopathies: Secondary | ICD-10-CM

## 2020-05-20 DIAGNOSIS — R079 Chest pain, unspecified: Secondary | ICD-10-CM

## 2020-05-20 NOTE — Telephone Encounter (Signed)
Pt contacted pre-catheterization scheduled at [facility] for: Thursday May 21, 2020 9:30 AM Verified arrival time and place: [facility] Main Entrance A [facility]) at: 7:30 AM   No solid food after midnight prior to cath, clear liquids until 5 AM day of procedure.  AM meds can be  taken pre-cath with sips of water including: ASA 81 mg   Confirmed patient has responsible adult to drive home post procedure and be with patient first 24 hours after arriving home: yes  You are allowed ONE visitor in the waiting room during the time you are at the hospital for your procedure. Both you and your visitor must wear a mask once you enter the hospital.   Reviewed procedure/mask/visitor instructions with patient.  *see 05/15/20 CBC results: -patient denies any signs/symptoms of active bleeding, including hematuria, black, tarry stools.  -patient instructed to go to [employer] 05/28/20  for repeat CBC.

## 2020-05-21 ENCOUNTER — Other Ambulatory Visit: Payer: Self-pay

## 2020-05-21 ENCOUNTER — Inpatient Hospital Stay (HOSPITAL_COMMUNITY)
Admission: AD | Disposition: A | Discharge status: Home / Self Care | Payer: Self-pay | Source: Ambulatory Visit | Attending: Cardiovascular Disease

## 2020-05-21 ENCOUNTER — Encounter (HOSPITAL_COMMUNITY): Payer: Self-pay | Admitting: Cardiovascular Disease

## 2020-05-21 ENCOUNTER — Inpatient Hospital Stay (HOSPITAL_COMMUNITY)
Admission: AD | Admit: 2020-05-21 | Discharge: 2020-05-22 | DRG: 287 | Disposition: A | Discharge status: Home / Self Care | Payer: Medicare Other | Attending: Cardiology | Admitting: Cardiology

## 2020-05-21 DIAGNOSIS — I739 Peripheral vascular disease, unspecified: Secondary | ICD-10-CM | POA: Diagnosis not present

## 2020-05-21 DIAGNOSIS — Z79890 Hormone replacement therapy (postmenopausal): Secondary | ICD-10-CM | POA: Diagnosis not present

## 2020-05-21 DIAGNOSIS — K219 Gastro-esophageal reflux disease without esophagitis: Secondary | ICD-10-CM | POA: Diagnosis present

## 2020-05-21 DIAGNOSIS — Z79899 Other long term (current) drug therapy: Secondary | ICD-10-CM | POA: Diagnosis not present

## 2020-05-21 DIAGNOSIS — Z888 Allergy status to other drugs, medicaments and biological substances status: Secondary | ICD-10-CM | POA: Diagnosis not present

## 2020-05-21 DIAGNOSIS — E8881 Metabolic syndrome: Secondary | ICD-10-CM | POA: Diagnosis present

## 2020-05-21 DIAGNOSIS — Z7982 Long term (current) use of aspirin: Secondary | ICD-10-CM | POA: Diagnosis not present

## 2020-05-21 DIAGNOSIS — F419 Anxiety disorder, unspecified: Secondary | ICD-10-CM | POA: Diagnosis present

## 2020-05-21 DIAGNOSIS — Z20822 Contact with and (suspected) exposure to covid-19: Secondary | ICD-10-CM | POA: Diagnosis present

## 2020-05-21 DIAGNOSIS — I25118 Atherosclerotic heart disease of native coronary artery with other forms of angina pectoris: Secondary | ICD-10-CM | POA: Diagnosis present

## 2020-05-21 DIAGNOSIS — E039 Hypothyroidism, unspecified: Secondary | ICD-10-CM | POA: Diagnosis not present

## 2020-05-21 DIAGNOSIS — D649 Anemia, unspecified: Secondary | ICD-10-CM | POA: Diagnosis present

## 2020-05-21 DIAGNOSIS — I779 Disorder of arteries and arterioles, unspecified: Secondary | ICD-10-CM | POA: Diagnosis present

## 2020-05-21 DIAGNOSIS — L309 Dermatitis, unspecified: Secondary | ICD-10-CM | POA: Diagnosis not present

## 2020-05-21 DIAGNOSIS — I25110 Atherosclerotic heart disease of native coronary artery with unstable angina pectoris: Secondary | ICD-10-CM | POA: Diagnosis present

## 2020-05-21 DIAGNOSIS — Z880 Allergy status to penicillin: Secondary | ICD-10-CM

## 2020-05-21 DIAGNOSIS — I10 Essential (primary) hypertension: Secondary | ICD-10-CM | POA: Diagnosis not present

## 2020-05-21 DIAGNOSIS — E785 Hyperlipidemia, unspecified: Secondary | ICD-10-CM | POA: Diagnosis not present

## 2020-05-21 DIAGNOSIS — Z91011 Allergy to milk products: Secondary | ICD-10-CM

## 2020-05-21 DIAGNOSIS — Z8249 Family history of ischemic heart disease and other diseases of the circulatory system: Secondary | ICD-10-CM | POA: Diagnosis not present

## 2020-05-21 DIAGNOSIS — Z87891 Personal history of nicotine dependence: Secondary | ICD-10-CM | POA: Diagnosis not present

## 2020-05-21 DIAGNOSIS — E559 Vitamin D deficiency, unspecified: Secondary | ICD-10-CM | POA: Diagnosis present

## 2020-05-21 DIAGNOSIS — I6523 Occlusion and stenosis of bilateral carotid arteries: Secondary | ICD-10-CM | POA: Diagnosis not present

## 2020-05-21 DIAGNOSIS — I2510 Atherosclerotic heart disease of native coronary artery without angina pectoris: Secondary | ICD-10-CM | POA: Diagnosis present

## 2020-05-21 HISTORY — PX: LEFT HEART CATH AND CORONARY ANGIOGRAPHY: CATH118249

## 2020-05-21 HISTORY — DX: Atherosclerotic heart disease of native coronary artery without angina pectoris: I25.10

## 2020-05-21 HISTORY — PX: INTRAVASCULAR PRESSURE WIRE/FFR STUDY: CATH118243

## 2020-05-21 LAB — POCT ACTIVATED CLOTTING TIME: Activated Clotting Time: 220 seconds

## 2020-05-21 SURGERY — LEFT HEART CATH AND CORONARY ANGIOGRAPHY
Anesthesia: LOCAL

## 2020-05-21 MED ORDER — ASPIRIN EC 81 MG PO TBEC: 81.0000 mg | DELAYED_RELEASE_TABLET | Freq: Every morning | ORAL | Status: DC

## 2020-05-21 MED ORDER — LABETALOL HCL 5 MG/ML IV SOLN: 10.0000 mg | INTRAVENOUS | Status: AC | PRN

## 2020-05-21 MED ORDER — SODIUM CHLORIDE 0.9% FLUSH: 3.0000 mL | INTRAVENOUS | Status: DC | PRN

## 2020-05-21 MED ORDER — HEPARIN SODIUM (PORCINE) 1000 UNIT/ML IJ SOLN
INTRAMUSCULAR | Status: DC | PRN
  Administered 2020-05-21 (×3): 2500 [IU] via INTRAVENOUS

## 2020-05-21 MED ORDER — CLOPIDOGREL BISULFATE 75 MG PO TABS
75.0000 mg | ORAL_TABLET | Freq: Every day | ORAL | Status: DC
  Administered 2020-05-22: 75 mg via ORAL
  Filled 2020-05-21: qty 1

## 2020-05-21 MED ORDER — HEPARIN (PORCINE) IN NACL 1000-0.9 UT/500ML-% IV SOLN
INTRAVENOUS | Status: AC
  Filled 2020-05-21: qty 1000

## 2020-05-21 MED ORDER — SODIUM CHLORIDE 0.9% FLUSH
3.0000 mL | Freq: Two times a day (BID) | INTRAVENOUS | Status: DC
  Administered 2020-05-22: 3 mL via INTRAVENOUS

## 2020-05-21 MED ORDER — ALBUTEROL SULFATE HFA 108 (90 BASE) MCG/ACT IN AERS
2.0000 | INHALATION_SPRAY | Freq: Four times a day (QID) | RESPIRATORY_TRACT | Status: DC | PRN
  Filled 2020-05-21: qty 6.7

## 2020-05-21 MED ORDER — SODIUM CHLORIDE 0.9 % WEIGHT BASED INFUSION: 1.0000 mL/kg/h | INTRAVENOUS | Status: DC

## 2020-05-21 MED ORDER — SODIUM CHLORIDE 0.9 % IV SOLN: 250.0000 mL | INTRAVENOUS | Status: DC | PRN

## 2020-05-21 MED ORDER — ASPIRIN 81 MG PO CHEW
81.0000 mg | CHEWABLE_TABLET | Freq: Every day | ORAL | Status: DC
  Administered 2020-05-22: 81 mg via ORAL
  Filled 2020-05-21: qty 1

## 2020-05-21 MED ORDER — HEPARIN (PORCINE) IN NACL 1000-0.9 UT/500ML-% IV SOLN
INTRAVENOUS | Status: DC | PRN
  Administered 2020-05-21: 500 mL

## 2020-05-21 MED ORDER — LIDOCAINE HCL (PF) 1 % IJ SOLN
INTRAMUSCULAR | Status: AC
  Filled 2020-05-21: qty 30

## 2020-05-21 MED ORDER — ASPIRIN 81 MG PO CHEW: 81.0000 mg | CHEWABLE_TABLET | ORAL | Status: DC

## 2020-05-21 MED ORDER — SODIUM CHLORIDE 0.9 % IV SOLN: INTRAVENOUS | Status: AC

## 2020-05-21 MED ORDER — ASPIRIN 81 MG PO CHEW
81.0000 mg | CHEWABLE_TABLET | ORAL | Status: DC
  Filled 2020-05-21: qty 1

## 2020-05-21 MED ORDER — NITROGLYCERIN 1 MG/10 ML FOR IR/CATH LAB
INTRA_ARTERIAL | Status: AC
  Filled 2020-05-21: qty 10

## 2020-05-21 MED ORDER — ATORVASTATIN CALCIUM 80 MG PO TABS
80.0000 mg | ORAL_TABLET | Freq: Every day | ORAL | Status: DC
  Administered 2020-05-22: 80 mg via ORAL
  Filled 2020-05-21: qty 1

## 2020-05-21 MED ORDER — PANTOPRAZOLE SODIUM 40 MG PO TBEC
40.0000 mg | DELAYED_RELEASE_TABLET | Freq: Every day | ORAL | Status: DC
  Administered 2020-05-22: 40 mg via ORAL
  Filled 2020-05-21: qty 1

## 2020-05-21 MED ORDER — ONDANSETRON HCL 4 MG/2ML IJ SOLN: 4.0000 mg | Freq: Four times a day (QID) | INTRAMUSCULAR | Status: DC | PRN

## 2020-05-21 MED ORDER — VERAPAMIL HCL 2.5 MG/ML IV SOLN
INTRAVENOUS | Status: AC
  Filled 2020-05-21: qty 2

## 2020-05-21 MED ORDER — SODIUM CHLORIDE 0.9% FLUSH: 3.0000 mL | Freq: Two times a day (BID) | INTRAVENOUS | Status: DC

## 2020-05-21 MED ORDER — FENTANYL CITRATE (PF) 100 MCG/2ML IJ SOLN
INTRAMUSCULAR | Status: AC
  Filled 2020-05-21: qty 2

## 2020-05-21 MED ORDER — SODIUM CHLORIDE 0.9 % WEIGHT BASED INFUSION
3.0000 mL/kg/h | INTRAVENOUS | Status: DC
  Administered 2020-05-21: 3 mL/kg/h via INTRAVENOUS

## 2020-05-21 MED ORDER — MIDAZOLAM HCL 2 MG/2ML IJ SOLN
INTRAMUSCULAR | Status: DC | PRN
  Administered 2020-05-21: 1 mg via INTRAVENOUS

## 2020-05-21 MED ORDER — ATORVASTATIN CALCIUM 40 MG PO TABS: 40.0000 mg | ORAL_TABLET | Freq: Every day | ORAL | Status: DC

## 2020-05-21 MED ORDER — MIDAZOLAM HCL 2 MG/2ML IJ SOLN
INTRAMUSCULAR | Status: AC
  Filled 2020-05-21: qty 2

## 2020-05-21 MED ORDER — HYDRALAZINE HCL 20 MG/ML IJ SOLN: 10.0000 mg | INTRAMUSCULAR | Status: AC | PRN

## 2020-05-21 MED ORDER — ACETAMINOPHEN 325 MG PO TABS
650.0000 mg | ORAL_TABLET | ORAL | Status: DC | PRN
  Administered 2020-05-21 – 2020-05-22 (×3): 650 mg via ORAL
  Filled 2020-05-21 (×3): qty 2

## 2020-05-21 MED ORDER — LIDOCAINE HCL (PF) 1 % IJ SOLN
INTRAMUSCULAR | Status: DC | PRN
  Administered 2020-05-21: 3 mL

## 2020-05-21 MED ORDER — BENAZEPRIL HCL 40 MG PO TABS
40.0000 mg | ORAL_TABLET | Freq: Every morning | ORAL | Status: DC
  Administered 2020-05-22: 40 mg via ORAL
  Filled 2020-05-21 (×2): qty 1

## 2020-05-21 MED ORDER — LEVOTHYROXINE SODIUM 75 MCG PO TABS
75.0000 ug | ORAL_TABLET | Freq: Every day | ORAL | Status: DC
  Administered 2020-05-22: 75 ug via ORAL
  Filled 2020-05-21: qty 1

## 2020-05-21 MED ORDER — SODIUM CHLORIDE 0.9 % WEIGHT BASED INFUSION
3.0000 mL/kg/h | INTRAVENOUS | Status: AC
  Administered 2020-05-22: 3 mL/kg/h via INTRAVENOUS

## 2020-05-21 MED ORDER — FENTANYL CITRATE (PF) 100 MCG/2ML IJ SOLN
INTRAMUSCULAR | Status: DC | PRN
  Administered 2020-05-21 (×2): 25 ug via INTRAVENOUS

## 2020-05-21 MED ORDER — MORPHINE SULFATE (PF) 2 MG/ML IV SOLN: 2.0000 mg | INTRAVENOUS | Status: DC | PRN

## 2020-05-21 MED ORDER — CLOPIDOGREL BISULFATE 300 MG PO TABS
ORAL_TABLET | ORAL | Status: DC | PRN
  Administered 2020-05-21: 600 mg via ORAL

## 2020-05-21 MED ORDER — FAMOTIDINE IN NACL 20-0.9 MG/50ML-% IV SOLN
INTRAVENOUS | Status: AC | PRN
  Administered 2020-05-21: 20 mg via INTRAVENOUS

## 2020-05-21 MED ORDER — CLOPIDOGREL BISULFATE 300 MG PO TABS
ORAL_TABLET | ORAL | Status: AC
  Filled 2020-05-21: qty 2

## 2020-05-21 MED ORDER — HEPARIN SODIUM (PORCINE) 1000 UNIT/ML IJ SOLN
INTRAMUSCULAR | Status: AC
  Filled 2020-05-21: qty 1

## 2020-05-21 MED ORDER — IOHEXOL 350 MG/ML SOLN
INTRAVENOUS | Status: DC | PRN
  Administered 2020-05-21: 175 mL

## 2020-05-21 MED ORDER — CITALOPRAM HYDROBROMIDE 20 MG PO TABS
40.0000 mg | ORAL_TABLET | Freq: Every morning | ORAL | Status: DC
Start: 2020-05-22 — End: 2020-05-22
  Administered 2020-05-22: 40 mg via ORAL
  Filled 2020-05-21: qty 2

## 2020-05-21 SURGICAL SUPPLY — 17 items
CATH INFINITI 5 FR AR1 MOD (CATHETERS) ×2 IMPLANT
CATH INFINITI JR4 5F (CATHETERS) ×2 IMPLANT
CATH OPTITORQUE TIG 4.0 5F (CATHETERS) ×2 IMPLANT
CATH VISTA GUIDE 6FR XBLAD3.0 (CATHETERS) ×2 IMPLANT
DEVICE RAD COMP TR BAND LRG (VASCULAR PRODUCTS) ×2 IMPLANT
GLIDESHEATH SLEND A-KIT 6F 22G (SHEATH) ×2 IMPLANT
GUIDEWIRE INQWIRE 1.5J.035X260 (WIRE) ×1 IMPLANT
GUIDEWIRE PRESSURE X 175 (WIRE) ×2 IMPLANT
INQWIRE 1.5J .035X260CM (WIRE) ×2
KIT ESSENTIALS PG (KITS) ×2 IMPLANT
KIT HEART LEFT (KITS) ×2 IMPLANT
PACK CARDIAC CATHETERIZATION (CUSTOM PROCEDURE TRAY) ×2 IMPLANT
STOPCOCK MORSE 400PSI 3WAY (MISCELLANEOUS) ×2 IMPLANT
SYR MEDRAD MARK 7 150ML (SYRINGE) ×2 IMPLANT
TRANSDUCER W/STOPCOCK (MISCELLANEOUS) ×2 IMPLANT
TUBING CIL FLEX 10 FLL-RA (TUBING) ×2 IMPLANT
WIRE HI TORQ VERSACORE-J 145CM (WIRE) ×2 IMPLANT

## 2020-05-21 NOTE — Interval H&P Note (Signed)
Cath Lab Visit (complete for each Cath Lab visit)  Clinical Evaluation Leading to the Procedure:   ACS: No.  Non-ACS:    Anginal Classification: CCS II  Anti-ischemic medical therapy: No Therapy  Non-Invasive Test Results: No non-invasive testing performed  Prior CABG: No previous CABG      History and Physical Interval Note:  05/21/2020 10:38 AM  Alexis [name] A Birr  has presented today for surgery, with the diagnosis of chest pain.  The various methods of treatment have been discussed with the patient and family. After consideration of risks, benefits and other options for treatment, the patient has consented to  Procedure(s): LEFT HEART CATH AND CORONARY ANGIOGRAPHY (N/A) as a surgical intervention.  The patient's history has been reviewed, patient examined, no change in status, stable for surgery.  I have reviewed the patient's chart and labs.  Questions were answered to the patient's satisfaction.     [name]

## 2020-05-22 ENCOUNTER — Encounter (HOSPITAL_COMMUNITY): Payer: Self-pay | Admitting: Cardiovascular Disease

## 2020-05-22 ENCOUNTER — Other Ambulatory Visit: Payer: Self-pay | Admitting: Cardiology

## 2020-05-22 DIAGNOSIS — D649 Anemia, unspecified: Secondary | ICD-10-CM

## 2020-05-22 DIAGNOSIS — Z79899 Other long term (current) drug therapy: Secondary | ICD-10-CM

## 2020-05-22 DIAGNOSIS — I6523 Occlusion and stenosis of bilateral carotid arteries: Secondary | ICD-10-CM

## 2020-05-22 LAB — BASIC METABOLIC PANEL
Anion gap: 7 (ref 5–15)
BUN: 6 mg/dL — ABNORMAL LOW (ref 8–23)
CO2: 25 mmol/L (ref 22–32)
Calcium: 8.7 mg/dL — ABNORMAL LOW (ref 8.9–10.3)
Chloride: 107 mmol/L (ref 98–111)
Creatinine, Ser: 0.67 mg/dL (ref 0.44–1.00)
GFR, Estimated: >60 mL/min (ref >60)
Glucose, Bld: 100 mg/dL — ABNORMAL HIGH (ref 70–99)
Potassium: 3.7 mmol/L (ref 3.5–5.1)
Sodium: 139 mmol/L (ref 135–145)

## 2020-05-22 LAB — CBC
HCT: 24.5 % — ABNORMAL LOW (ref 36.0–46.0)
HCT: 23.4 % — ABNORMAL LOW (ref 36.0–46.0)
Hemoglobin: 7.3 g/dL — ABNORMAL LOW (ref 12.0–15.0)
Hemoglobin: 7.1 g/dL — ABNORMAL LOW (ref 12.0–15.0)
MCH: 27.1 pg (ref 26.0–34.0)
MCH: 27.5 pg (ref 26.0–34.0)
MCHC: 29.8 g/dL — ABNORMAL LOW (ref 30.0–36.0)
MCHC: 30.3 g/dL (ref 30.0–36.0)
MCV: 91.1 fL (ref 80.0–100.0)
MCV: 90.7 fL (ref 80.0–100.0)
Platelets: 215 10*3/uL (ref 150–400)
Platelets: 211 10*3/uL (ref 150–400)
RBC: 2.69 MIL/uL — ABNORMAL LOW (ref 3.87–5.11)
RBC: 2.58 MIL/uL — ABNORMAL LOW (ref 3.87–5.11)
RDW: 15.9 % — ABNORMAL HIGH (ref 11.5–15.5)
RDW: 15.9 % — ABNORMAL HIGH (ref 11.5–15.5)
WBC: 6.8 10*3/uL (ref 4.0–10.5)
WBC: 5.8 10*3/uL (ref 4.0–10.5)
nRBC: 0 % (ref 0.0–0.2)
nRBC: 0 % (ref 0.0–0.2)

## 2020-05-22 LAB — ABO/RH: ABO/RH(D): O POS

## 2020-05-22 LAB — TYPE AND SCREEN
ABO/RH(D): O POS
Antibody Screen: NEGATIVE

## 2020-05-22 SURGERY — CORONARY STENT INTERVENTION
Anesthesia: LOCAL

## 2020-05-22 MED ORDER — CLOPIDOGREL BISULFATE 75 MG PO TABS: 75.0000 mg | ORAL_TABLET | Freq: Every day | ORAL | 3 refills | Status: DC

## 2020-05-22 MED ORDER — ATORVASTATIN CALCIUM 80 MG PO TABS
80.0000 mg | ORAL_TABLET | Freq: Every day | ORAL | 2 refills | Status: DC
Start: 2020-05-23 — End: 2021-04-02

## 2020-05-22 MED ORDER — PANTOPRAZOLE SODIUM 40 MG PO TBEC: 40.0000 mg | DELAYED_RELEASE_TABLET | Freq: Every day | ORAL | 3 refills | Status: DC

## 2020-05-22 MED FILL — Verapamil HCl IV Soln 2.5 MG/ML: INTRAVENOUS | Qty: 2 | Status: AC

## 2020-05-22 MED FILL — Nitroglycerin IV Soln 100 MCG/ML in D5W: INTRA_ARTERIAL | Qty: 10 | Status: AC

## 2020-05-22 NOTE — Progress Notes (Signed)
Update: I reviewed the films with Dr. Kirke Corin. He feels this should be amenable to PCI. I discussed with the patient options including staying for PCI on 3/28 vs returning for outpatient PCI.   After discussion, she would like to go home and return for PCI. She has not had persistent symptoms but was instructed that if she has angina that does not resolve with rest, she needs to call 911 immediately.  Meds: Aspirin 81 mg daily Clopidogrel 75 mg daily Atorvastatin 80 mg daily Benazepril 40 mg daily Sublingual NG  Recheck CBC, BMET next week at [facility] office  PCI to ostial RCA with Dr. Kirke Corin 4/6 (will need repeat Covid swab)  We will work to discharge her today.  Jodelle Red, MD, PhD, [facility]  [facility]  [address], [address] [place], [place] [address] [phone]

## 2020-05-22 NOTE — Progress Notes (Signed)
Progress Note  Patient Name: Alexis [name] Date of Encounter: 05/22/2020  Primary Cardiologist: Dr. Harrell Gave, MD   Subjective   Feels great with no recurrent chest pain. Looks great this morning however Hb dropped overnight from 9.3 to 7.1 therefore stent intervention canceled for today.   Inpatient Medications    Scheduled Meds: . aspirin  81 mg Oral Daily  . aspirin  81 mg Oral Pre-Cath  . atorvastatin  80 mg Oral Daily  . benazepril  40 mg Oral q AM  . citalopram  40 mg Oral q AM  . clopidogrel  75 mg Oral Q breakfast  . levothyroxine  75 mcg Oral QAC breakfast  . pantoprazole  40 mg Oral Daily  . sodium chloride flush  3 mL Intravenous Q12H  . sodium chloride flush  3 mL Intravenous Q12H  . sodium chloride flush  3 mL Intravenous Q12H   Continuous Infusions: . sodium chloride    . sodium chloride    . sodium chloride 1 mL/kg/hr (05/22/20 7353)   PRN Meds: sodium chloride, sodium chloride, acetaminophen, albuterol, morphine injection, ondansetron (ZOFRAN) IV, sodium chloride flush, sodium chloride flush   Vital Signs    Vitals:   05/21/20 1928 05/21/20 2347 05/22/20 0410 05/22/20 0617  BP: 120/72 (!) 158/78 100/64 131/86  Pulse: 94 93 67   Resp: _0 Temp: 98 F (36.7 C) 98.2 F (36.8 C) 98.2 F (36.8 C)   TempSrc: Oral Oral Oral   SpO2: 93% 98% 98%   Weight:      Height:        Intake/Output Summary (Last 24 hours) at 05/22/2020 0735 Last data filed at 05/22/2020 0307 Gross per 24 hour  Intake 1450.8 ml  Output --  Net 1450.8 ml   Filed Weights   05/21/20 0723 05/21/20 1444  Weight: 43.5 kg 44.7 kg    Physical Exam   General: Well developed, well nourished, NAD Neck: Negative for carotid bruits. No JVD Lungs:Clear to ausculation bilaterally. No wheezes, rales, or rhonchi. Breathing is unlabored. Cardiovascular: RRR with S1 S2. No murmurs Abdomen: Soft, non-tender, non-distended. No obvious abdominal masses. Extremities: No  edema. Radial site stable Neuro: Alert and oriented. No focal deficits. No facial asymmetry. MAE spontaneously. Psych: Responds to questions appropriately with normal affect.    Labs    Chemistry Recent Labs  Lab 05/15/20 1239 05/22/20 0256  NA 145* 139  K 4.5 3.7  CL 104 107  CO2 26 25  GLUCOSE 76 100*  BUN 16 6*  CREATININE 0.76 0.67  CALCIUM 9.5 8.7*  GFRNONAA  --  >60  ANIONGAP  --  7     Hematology Recent Labs  Lab 05/15/20 1239 05/22/20 0256 05/22/20 0517  WBC 6.4 6.8 5.8  RBC 3.63* 2.69* 2.58*  HGB 9.9* 7.3* 7.1*  HCT 31.9* 24.5* 23.4*  MCV 88 91.1 90.7  MCH 27.3 27.1 27.5  MCHC 31.0* 29.8* 30.3  RDW 14.2 15.9* 15.9*  PLT 331 215 211    Cardiac EnzymesNo results for input(s): TROPONINI in the last 168 hours. No results for input(s): TROPIPOC in the last 168 hours.   BNPNo results for input(s): BNP, PROBNP in the last 168 hours.   DDimer No results for input(s): DDIMER in the last 168 hours.   Radiology    CARDIAC CATHETERIZATION  Result Date: 05/21/2020  Ost RCA to Prox RCA lesion is 99% stenosed.  Mid RCA lesion is 75% stenosed.  Colon Flattery  LM to Mid LM lesion is 40% stenosed.  Ost Cx to Prox Cx lesion is 60% stenosed.  The left ventricular systolic function is normal.  LV end diastolic pressure is normal.  The left ventricular ejection fraction is 50-55% by visual estimate.  Alexis [name] is a 75 y.o. female  [id number] LOCATION:  FACILITY: [facility] PHYSICIAN: Quay Burow, M.D. Apr 20, 1945 DATE OF PROCEDURE:  05/21/2020 DATE OF DISCHARGE: CARDIAC CATHETERIZATION History obtained from chart review.  Thin and frail appearing married Caucasian female with history of hypertension, hyperlipidemia and peripheral vascular disease.  I stented her iliac arteries in 2015 using "kissing stent technique.  She has also has known left subclavian artery stenosis with a 50 mm upper extremity gradient.  She saw Dr. Al Pimple with complaints of dyspnea on exertion and  chest pain.  She was referred to me for diagnostic coronary angiography to define her anatomy. PROCEDURE DESCRIPTION: The patient was brought to the second floor  Cardiac cath lab in the postabsorptive state.  She was premedicated with IV Versed and fentanyl.  Her right wristwas prepped and shaved in usual sterile fashion. Xylocaine 1% was used for local anesthesia. A 6  French sheath was inserted into the right radial  artery using standard Seldinger technique. The patient received 2500 units  of heparin intravenously.  A 5 French TIG catheter and AR-1 catheters were used for selective coronary angiography and left ventriculography respectively.  Isovue dye was used for the entirety of the case.  Retrograde aortic and left ventricular and pullback pressures were recorded.  Radial cocktail was administered via the SideArm sheath. Patient received a total of 75 point units of heparin.  Using a 6 [place] XB LAD 3.0 cm guide catheter along with a DFR wire across the left main once I had a therapeutic ACT demonstrating a RFR of 0.97 suggesting that the left main was not physiologically significant.   Ms. Lamarre has a 99% calcified ostial dominant RCA with a downgoing origin and difficult to engage with a catheter making percutaneous intervention somewhat challenging.  In addition she has a 75% stenosis in the mid vessel.  She is a 40% distal left main stenosis that did not appear to be physiologically significant by RFR analysis and normal LV function.  I used 270 cc of contrast for the during the procedure.  She will need consideration for RCA intervention tomorrow.  I did load her with 600 mg of p.o. Plavix in anticipation for percutaneous intervention tomorrow. Quay Burow. MD, [facility] 05/21/2020 12:01 PM   Telemetry    05/22/20 NSR Personally Reviewed  ECG    No new tracing as of 05/22/20- Personally Reviewed  Cardiac Studies   LHC 05/21/20:   Ost RCA to Prox RCA lesion is 99% stenosed.  Mid  RCA lesion is 75% stenosed.  Ost LM to Mid LM lesion is 40% stenosed.  Ost Cx to Prox Cx lesion is 60% stenosed.  The left ventricular systolic function is normal.  LV end diastolic pressure is normal.  The left ventricular ejection fraction is 50-55% by visual estimate.    IMPRESSION: Ms. Blixt has a 99% calcified ostial dominant RCA with a downgoing origin and difficult to engage with a catheter making percutaneous intervention somewhat challenging.  In addition she has a 75% stenosis in the mid vessel.  She is a 40% distal left main stenosis that did not appear to be physiologically significant by RFR analysis and normal LV function.  I used 270 cc  of contrast for the during the procedure.  She will need consideration for RCA intervention tomorrow.  I did load her with 600 mg of p.o. Plavix in anticipation for percutaneous intervention tomorrow.   Diagnostic Dominance: Right    Intervention  Patient Profile     75 y.o. female with a hx of PAD (carotid artery disease, subclavian artery stenosis, claudication), hypertension, asthma, hyperlipidemia who is seen for follow up today. I initially met her 05/09/19 as a new consult at the request of [place], Modena Nunnery, MD for the evaluation and management of carotid bruit. She was last seen by Dr. Gwenlyn Found in 2015.  Assessment & Plan    1. Chest pain: -Pt seen by Dr. Harrell Gave 05/15/20 with chest pain and SOB with plan for OP LHC with Dr. Gwenlyn Found. This was performed 05/21/20 which showed Ost RCA to pRCA lesion is 99% stenosed, mRCA lesion is 75% stenosed, Ost LM to Mid LM lesion is 40% stenosed, Ost Cx to Prox Cx lesion is 60% stenosed. 40% distal left main stenosis that did not appear to be physiologically significant by RFR analysis -Plan was for PCA intervention today however hb overnight went from 9.3>>>7.1 this AM. Case cancelled until Hb more stable.  -Denies recurrent symptoms>>cath site stable with no s/s of hematoma or active bleeding.  She has no c/o of abdominal or chest pain. Unclear etiology. -She was loaded with 660m Plavix yesterday   2. Anemia: -Hb dropped from 9.9>>>7.1 this AM -Baseline from 2015/2016 appears to be 12.0 -No acute s/s of bleeding, no chest pain, abdominal pain -Unclear etiology>>dilution?    3. PAD: -Has known carotid disease, subclavian and hx of claudication s/p bilateral iliac stenting in 2012 -Continue ASA, statin therapies -No c/o neurological changes, no LE pain  -Was referred to [facility] for potential surgical evaluation   4. HTN: -Stable, 131/86>>100/64>>158/78 -Continue benazepril 437m  4. HLD: -Last LDL, 65 from 05/15/20 -Continue statin therapy   Signed, JiKathyrn DrownP-C [facility] Pager: [phone]/25/2022, 7:35 AM     For questions or updates, please contact   Please consult [email or web address] for contact info under Cardiology/STEMI.

## 2020-05-22 NOTE — H&P (View-Only) (Signed)
Progress Note  Patient Name: Alexis [name] Date of Encounter: 05/22/2020  Primary Cardiologist: Dr. Harrell Gave, MD   Subjective   Feels great with no recurrent chest pain. Looks great this morning however Hb dropped overnight from 9.3 to 7.1 therefore stent intervention canceled for today.   Inpatient Medications    Scheduled Meds: . aspirin  81 mg Oral Daily  . aspirin  81 mg Oral Pre-Cath  . atorvastatin  80 mg Oral Daily  . benazepril  40 mg Oral q AM  . citalopram  40 mg Oral q AM  . clopidogrel  75 mg Oral Q breakfast  . levothyroxine  75 mcg Oral QAC breakfast  . pantoprazole  40 mg Oral Daily  . sodium chloride flush  3 mL Intravenous Q12H  . sodium chloride flush  3 mL Intravenous Q12H  . sodium chloride flush  3 mL Intravenous Q12H   Continuous Infusions: . sodium chloride    . sodium chloride    . sodium chloride 1 mL/kg/hr (05/22/20 7353)   PRN Meds: sodium chloride, sodium chloride, acetaminophen, albuterol, morphine injection, ondansetron (ZOFRAN) IV, sodium chloride flush, sodium chloride flush   Vital Signs    Vitals:   05/21/20 1928 05/21/20 2347 05/22/20 0410 05/22/20 0617  BP: 120/72 (!) 158/78 100/64 131/86  Pulse: 94 93 67   Resp: _0 Temp: 98 F (36.7 C) 98.2 F (36.8 C) 98.2 F (36.8 C)   TempSrc: Oral Oral Oral   SpO2: 93% 98% 98%   Weight:      Height:        Intake/Output Summary (Last 24 hours) at 05/22/2020 0735 Last data filed at 05/22/2020 0307 Gross per 24 hour  Intake 1450.8 ml  Output --  Net 1450.8 ml   Filed Weights   05/21/20 0723 05/21/20 1444  Weight: 43.5 kg 44.7 kg    Physical Exam   General: Well developed, well nourished, NAD Neck: Negative for carotid bruits. No JVD Lungs:Clear to ausculation bilaterally. No wheezes, rales, or rhonchi. Breathing is unlabored. Cardiovascular: RRR with S1 S2. No murmurs Abdomen: Soft, non-tender, non-distended. No obvious abdominal masses. Extremities: No  edema. Radial site stable Neuro: Alert and oriented. No focal deficits. No facial asymmetry. MAE spontaneously. Psych: Responds to questions appropriately with normal affect.    Labs    Chemistry Recent Labs  Lab 05/15/20 1239 05/22/20 0256  NA 145* 139  K 4.5 3.7  CL 104 107  CO2 26 25  GLUCOSE 76 100*  BUN 16 6*  CREATININE 0.76 0.67  CALCIUM 9.5 8.7*  GFRNONAA  --  >60  ANIONGAP  --  7     Hematology Recent Labs  Lab 05/15/20 1239 05/22/20 0256 05/22/20 0517  WBC 6.4 6.8 5.8  RBC 3.63* 2.69* 2.58*  HGB 9.9* 7.3* 7.1*  HCT 31.9* 24.5* 23.4*  MCV 88 91.1 90.7  MCH 27.3 27.1 27.5  MCHC 31.0* 29.8* 30.3  RDW 14.2 15.9* 15.9*  PLT 331 215 211    Cardiac EnzymesNo results for input(s): TROPONINI in the last 168 hours. No results for input(s): TROPIPOC in the last 168 hours.   BNPNo results for input(s): BNP, PROBNP in the last 168 hours.   DDimer No results for input(s): DDIMER in the last 168 hours.   Radiology    CARDIAC CATHETERIZATION  Result Date: 05/21/2020  Ost RCA to Prox RCA lesion is 99% stenosed.  Mid RCA lesion is 75% stenosed.  Colon Flattery  LM to Mid LM lesion is 40% stenosed.  Ost Cx to Prox Cx lesion is 60% stenosed.  The left ventricular systolic function is normal.  LV end diastolic pressure is normal.  The left ventricular ejection fraction is 50-55% by visual estimate.  Alexis [name] is a 75 y.o. female  [id number] LOCATION:  FACILITY: [facility] PHYSICIAN: Quay Burow, M.D. Apr 20, 1945 DATE OF PROCEDURE:  05/21/2020 DATE OF DISCHARGE: CARDIAC CATHETERIZATION History obtained from chart review.  Thin and frail appearing married Caucasian female with history of hypertension, hyperlipidemia and peripheral vascular disease.  I stented her iliac arteries in 2015 using "kissing stent technique.  She has also has known left subclavian artery stenosis with a 50 mm upper extremity gradient.  She saw Dr. Al Pimple with complaints of dyspnea on exertion and  chest pain.  She was referred to me for diagnostic coronary angiography to define her anatomy. PROCEDURE DESCRIPTION: The patient was brought to the second floor  Cardiac cath lab in the postabsorptive state.  She was premedicated with IV Versed and fentanyl.  Her right wristwas prepped and shaved in usual sterile fashion. Xylocaine 1% was used for local anesthesia. A 6  French sheath was inserted into the right radial  artery using standard Seldinger technique. The patient received 2500 units  of heparin intravenously.  A 5 French TIG catheter and AR-1 catheters were used for selective coronary angiography and left ventriculography respectively.  Isovue dye was used for the entirety of the case.  Retrograde aortic and left ventricular and pullback pressures were recorded.  Radial cocktail was administered via the SideArm sheath. Patient received a total of 75 point units of heparin.  Using a 6 [place] XB LAD 3.0 cm guide catheter along with a DFR wire across the left main once I had a therapeutic ACT demonstrating a RFR of 0.97 suggesting that the left main was not physiologically significant.   Alexis [name] has a 99% calcified ostial dominant RCA with a downgoing origin and difficult to engage with a catheter making percutaneous intervention somewhat challenging.  In addition she has a 75% stenosis in the mid vessel.  She is a 40% distal left main stenosis that did not appear to be physiologically significant by RFR analysis and normal LV function.  I used 270 cc of contrast for the during the procedure.  She will need consideration for RCA intervention tomorrow.  I did load her with 600 mg of p.o. Plavix in anticipation for percutaneous intervention tomorrow. Quay Burow. MD, [facility] 05/21/2020 12:01 PM   Telemetry    05/22/20 NSR Personally Reviewed  ECG    No new tracing as of 05/22/20- Personally Reviewed  Cardiac Studies   LHC 05/21/20:   Ost RCA to Prox RCA lesion is 99% stenosed.  Mid  RCA lesion is 75% stenosed.  Ost LM to Mid LM lesion is 40% stenosed.  Ost Cx to Prox Cx lesion is 60% stenosed.  The left ventricular systolic function is normal.  LV end diastolic pressure is normal.  The left ventricular ejection fraction is 50-55% by visual estimate.    IMPRESSION: Alexis [name] has a 99% calcified ostial dominant RCA with a downgoing origin and difficult to engage with a catheter making percutaneous intervention somewhat challenging.  In addition she has a 75% stenosis in the mid vessel.  She is a 40% distal left main stenosis that did not appear to be physiologically significant by RFR analysis and normal LV function.  I used 270 cc  of contrast for the during the procedure.  She will need consideration for RCA intervention tomorrow.  I did load her with 600 mg of p.o. Plavix in anticipation for percutaneous intervention tomorrow.   Diagnostic Dominance: Right    Intervention  Patient Profile     75 y.o. female with a hx of PAD (carotid artery disease, subclavian artery stenosis, claudication), hypertension, asthma, hyperlipidemia who is seen for follow up today. I initially met her 05/09/19 as a new consult at the request of [place], Modena Nunnery, MD for the evaluation and management of carotid bruit. She was last seen by Dr. Gwenlyn Found in 2015.  Assessment & Plan    1. Chest pain: -Pt seen by Dr. Harrell Gave 05/15/20 with chest pain and SOB with plan for OP LHC with Dr. Gwenlyn Found. This was performed 05/21/20 which showed Ost RCA to pRCA lesion is 99% stenosed, mRCA lesion is 75% stenosed, Ost LM to Mid LM lesion is 40% stenosed, Ost Cx to Prox Cx lesion is 60% stenosed. 40% distal left main stenosis that did not appear to be physiologically significant by RFR analysis -Plan was for PCA intervention today however hb overnight went from 9.3>>>7.1 this AM. Case cancelled until Hb more stable.  -Denies recurrent symptoms>>cath site stable with no s/s of hematoma or active bleeding.  She has no c/o of abdominal or chest pain. Unclear etiology. -She was loaded with 660m Plavix yesterday   2. Anemia: -Hb dropped from 9.9>>>7.1 this AM -Baseline from 2015/2016 appears to be 12.0 -No acute s/s of bleeding, no chest pain, abdominal pain -Unclear etiology>>dilution?    3. PAD: -Has known carotid disease, subclavian and hx of claudication s/p bilateral iliac stenting in 2012 -Continue ASA, statin therapies -No c/o neurological changes, no LE pain  -Was referred to [facility] for potential surgical evaluation   4. HTN: -Stable, 131/86>>100/64>>158/78 -Continue benazepril 437m  4. HLD: -Last LDL, 65 from 05/15/20 -Continue statin therapy   Signed, JiKathyrn DrownP-C [facility] Pager: [phone]/25/2022, 7:35 AM     For questions or updates, please contact   Please consult [email or web address] for contact info under Cardiology/STEMI.

## 2020-05-22 NOTE — Discharge Summary (Addendum)
Discharge Summary    Patient ID: Alexis [name] MRN: [id number]; DOB: 07/18/1945  Admit date: 05/21/2020 Discharge date: 05/22/2020  PCP:  Alycia Rossetti, MD   [place]  Cardiologist:  Buford Dresser, MD   Discharge Diagnoses    Principal Problem:   CAD (coronary artery disease) Active Problems:   Peripheral vascular disease with claudication ([place])   Carotid artery disease ([place])   Hyperlipemia   HTN (hypertension)   Anemia  Diagnostic Studies/Procedures    LHC 05/21/20:   Ost RCA to Prox RCA lesion is 99% stenosed.  Mid RCA lesion is 75% stenosed.  Ost LM to Mid LM lesion is 40% stenosed.  Ost Cx to Prox Cx lesion is 60% stenosed.  The left ventricular systolic function is normal.  LV end diastolic pressure is normal.  The left ventricular ejection fraction is 50-55% by visual estimate.   IMPRESSION:Alexis [name] has a 99% calcified ostial dominant RCA with a downgoing origin and difficult to engage with a catheter making percutaneous intervention somewhat challenging. In addition she has a 75% stenosis in the mid vessel. She is a 40% distal left main stenosis that did not appear to be physiologically significant by RFR analysis and normal LV function. I used 270 cc of contrast for the during the procedure. She will need consideration for RCA intervention tomorrow. I did load her with 600 mg of p.o. Plavix in anticipation for percutaneous intervention tomorrow.   Diagnostic Dominance: Right     History of Present Illness     Alexis [name] is a 75 y.o. female with a hx of PAD (carotid artery disease, subclavian artery stenosis, claudication), hypertension, asthma, hyperlipidemia who is seen for follow up today. I initially met her 3/11/21as a new consult at the request of [place], Modena Nunnery, MDfor the evaluation and management of carotid bruit. She was last seen by Dr. Gwenlyn Found in 2015.  Pt seen by Dr. Harrell Gave  05/15/20 with c/o of short of breath with exertion. She reported SOB with bathing and performing home tasks. She also had mild chest tightness with exertion, described as an ache occurring more frequently. Leane Call (had in the past, felt terrible, and now with carotid disease not recommended) was discussed however cath was planned.   During her visit, vascular studies also performed  Hospital Course     LHC performed 05/21/20 that showed Ost RCA to pRCA lesion is 99% stenosed, mRCA lesion is 75% stenosed, Ost LM to Mid LM lesion is 40% stenosed, Ost Cx to Prox Cx lesion is 60% stenosed. 40% distal left main stenosis that did not appear to be physiologically significant by RFR analysis. Plan was for PCI intervention 05/22/20 however Hb overnight went from 9.3>>>7.1 this AM. Case was ultimately cancelled until Hb more stable. She had no recurrent chest pain. Cath site stable with no s/s of hematoma or active bleeding. She had no c/o of abdominal or chest pain with unclear etiology. She was continued on Plavix and ASA. Hb felt to be dilutional due to IVF hydration. She looks excellent on day of discharge. Will plan to repeat BMET and CBC prior to cath. She has follow up with Dr. Harrell Gave 06/22/20  Plan is for staged stent intervention 06/03/20 with Dr. Fletcher Anon. Strict ED precautions reviewed by Dr. Harrell Gave. Pt understands and agrees.   Per Dr. Harrell Gave:  "Update: I reviewed the films with Dr. Fletcher Anon. He feels this should be amenable to PCI. I discussed with the patient options  including staying for PCI on 3/28 vs returning for outpatient PCI.   After discussion, she would like to go home and return for PCI. She has not had persistent symptoms but was instructed that if she has angina that does not resolve with rest, she needs to call 911 immediately.  Meds: Aspirin 81 mg daily Clopidogrel 75 mg daily Atorvastatin 80 mg daily Benazepril 40 mg daily Sublingual NG  Recheck CBC, BMET next  week at [facility] office  PCI to ostial RCA with Dr. Fletcher Anon 4/6 (will need repeat Covid swab)"  Hospital problems:  Anemia: -Hb dropped from 9.9>>>7.1 this AM -Baseline from 2015/2016 appears to be 12.0 -No acute s/s of bleeding, no chest pain, abdominal pain -Felt to be dilutional due to aggressive pre cath IVF as there are no signs of acute bleeding. VSS  -Will order follow up labs to be performed in one week. Pt is aware.    PAD: -Has known carotid disease, subclavian and hx of claudication s/p bilateral iliac stenting in 2012 -Continue ASA, statin therapies -No c/o neurological changes, no LE pain  -Was referred to [facility] for potential surgical evaluation>>appointment with Dr. Carlis Abbott 05/26/20 at 1020am   HTN: -Stable, 131/86>>100/64>>158/78 -Continue benazepril 40mg    HLD: -Last LDL, 65 from 05/15/20 -Continue statin therapy   Consultants: None   The patient was seen and examined by Dr. Harrell Gave who feels that she is stable and ready for discharge today   Did the patient have an acute coronary syndrome (MI, NSTEMI, STEMI, etc) this admission?:  No                               Did the patient have a percutaneous coronary intervention (stent / angioplasty)?:  No.    Discharge Vitals Blood pressure 100/84, pulse 81, temperature 97.6 F (36.4 C), temperature source Oral, resp. rate 20, height 4\' 11"  (1.499 m), weight 44.7 kg, SpO2 97 %.  Filed Weights   05/21/20 0723 05/21/20 1444  Weight: 43.5 kg 44.7 kg    Labs & Radiologic Studies    CBC Recent Labs    05/22/20 0256 05/22/20 0517  WBC 6.8 5.8  HGB 7.3* 7.1*  HCT 24.5* 23.4*  MCV 91.1 90.7  PLT 215 638   Basic Metabolic Panel Recent Labs    05/22/20 0256  NA 139  K 3.7  CL 107  CO2 25  GLUCOSE 100*  BUN 6*  CREATININE 0.67  CALCIUM 8.7*   Liver Function Tests No results for input(s): AST, ALT, ALKPHOS, BILITOT, PROT, ALBUMIN in the last 72 hours. No results for input(s): LIPASE, AMYLASE in the  last 72 hours. High Sensitivity Troponin:   No results for input(s): TROPONINIHS in the last 720 hours.  BNP Invalid input(s): POCBNP D-Dimer No results for input(s): DDIMER in the last 72 hours. Hemoglobin A1C No results for input(s): HGBA1C in the last 72 hours. Fasting Lipid Panel No results for input(s): CHOL, HDL, LDLCALC, TRIG, CHOLHDL, LDLDIRECT in the last 72 hours. Thyroid Function Tests No results for input(s): TSH, T4TOTAL, T3FREE, THYROIDAB in the last 72 hours.  Invalid input(s): FREET3 _____________  CARDIAC CATHETERIZATION  Result Date: 05/21/2020  Ost RCA to Prox RCA lesion is 99% stenosed.  Mid RCA lesion is 75% stenosed.  Ost LM to Mid LM lesion is 40% stenosed.  Ost Cx to Prox Cx lesion is 60% stenosed.  The left ventricular systolic function is normal.  LV end  diastolic pressure is normal.  The left ventricular ejection fraction is 50-55% by visual estimate.  Alexis [name] is a 75 y.o. female  [id number] LOCATION:  FACILITY: [facility] PHYSICIAN: Quay Burow, M.D. 11/09/1945 DATE OF PROCEDURE:  05/21/2020 DATE OF DISCHARGE: CARDIAC CATHETERIZATION History obtained from chart review.  Thin and frail appearing married Caucasian female with history of hypertension, hyperlipidemia and peripheral vascular disease.  I stented her iliac arteries in 2015 using "kissing stent technique.  She has also has known left subclavian artery stenosis with a 50 mm upper extremity gradient.  She saw Dr. Al Pimple with complaints of dyspnea on exertion and chest pain.  She was referred to me for diagnostic coronary angiography to define her anatomy. PROCEDURE DESCRIPTION: The patient was brought to the second floor [facility] cath lab in the postabsorptive state.  She was premedicated with IV Versed and fentanyl.  Her right wristwas prepped and shaved in usual sterile fashion. Xylocaine 1% was used for local anesthesia. A 6  French sheath was inserted into the right radial   artery using standard Seldinger technique. The patient received 2500 units  of heparin intravenously.  A 5 French TIG catheter and AR-1 catheters were used for selective coronary angiography and left ventriculography respectively.  Isovue dye was used for the entirety of the case.  Retrograde aortic and left ventricular and pullback pressures were recorded.  Radial cocktail was administered via the SideArm sheath. Patient received a total of 75 point units of heparin.  Using a 6 [place] XB LAD 3.0 cm guide catheter along with a DFR wire across the left main once I had a therapeutic ACT demonstrating a RFR of 0.97 suggesting that the left main was not physiologically significant.   Alexis [name] has a 99% calcified ostial dominant RCA with a downgoing origin and difficult to engage with a catheter making percutaneous intervention somewhat challenging.  In addition she has a 75% stenosis in the mid vessel.  She is a 40% distal left main stenosis that did not appear to be physiologically significant by RFR analysis and normal LV function.  I used 270 cc of contrast for the during the procedure.  She will need consideration for RCA intervention tomorrow.  I did load her with 600 mg of p.o. Plavix in anticipation for percutaneous intervention tomorrow. Quay Burow. MD, [facility] 05/21/2020 12:01 PM   VAS US CAROTID  Result Date: 05/14/2020 Carotid Arterial Duplex Study Indications:                           Carotid artery disease and Patient denies                                        any cerebrovascular symptoms today. She                                        does get numbness and tingling in her                                        fingertips and feels this could be from  using the computer. These symptoms have                                        been happening for the past year                                        intermittently. She also indicated she                                         was told she has a blockage in left arm                                        and has bilateral shoulder pains for many                                        years. Risk Factors:                          Hypertension, hyperlipidemia, past                                        history of smoking, PAD. Comparison Study:                      Prior carotid duplex exam on 05/15/2019                                        showed highest velocities in right mid                                        ICA 222/37 cm/s and left proximal ICA                                        297/24 cm/s. Pre-Surgical Evaluation & Surgical     Stenosis proximal to left bifurcation Correlation                            (CCA stenosis). ICA is normal past the                                        stenosis. Anatomy on the left is within                                        normal limits.Left bifurcation  is located                                        near the [place]. Performing Technologist: Salvadore Dom RVT, RDCS (AE), RDMS  Examination Guidelines: A complete evaluation includes B-mode imaging, spectral Doppler, color Doppler, and power Doppler as needed of all accessible portions of each vessel. Bilateral testing is considered an integral part of a complete examination. Limited examinations for reoccurring indications may be performed as noted.  Right Carotid Findings: +----------+--------+--------+--------+------------------+---------------------+           PSV cm/sEDV cm/sStenosisPlaque DescriptionComments              +----------+--------+--------+--------+------------------+---------------------+ CCA Prox  207     13      <50%                                            +----------+--------+--------+--------+------------------+---------------------+ CCA Mid   48      9                                                        +----------+--------+--------+--------+------------------+---------------------+ CCA Distal63      17                                                      +----------+--------+--------+--------+------------------+---------------------+ ICA Prox  365     69      60-79%                                          +----------+--------+--------+--------+------------------+---------------------+ ICA Mid   205     27                                post stenotic                                                             turbulence            +----------+--------+--------+--------+------------------+---------------------+ ICA Distal113     24                                                      +----------+--------+--------+--------+------------------+---------------------+ ECA       22  near occlusion;                                                           stumped flow          +----------+--------+--------+--------+------------------+---------------------+ +----------+--------+-------+--------+-------------------+           PSV cm/sEDV cmsDescribeArm Pressure (mmHG) +----------+--------+-------+--------+-------------------+ [id number]            Stenotic163                 +----------+--------+-------+--------+-------------------+ +---------+--------+---+--------+--+---------+ VertebralPSV cm/s145EDV cm/s18Antegrade +---------+--------+---+--------+--+---------+ Right brachial artery antecubital fossa 93 cm/s, triphasic flow. Left Carotid Findings: +----------+--------+--------+--------+----------------------+--------+           PSV cm/sEDV cm/sStenosisPlaque Description    Comments +----------+--------+--------+--------+----------------------+--------+ CCA Prox  125     24                                             +----------+--------+--------+--------+----------------------+--------+ CCA Mid    87      14                                             +----------+--------+--------+--------+----------------------+--------+ CCA Distal420     61      >50%    irregular and calcific         +----------+--------+--------+--------+----------------------+--------+ ICA Prox  370     51      40-59%                                 +----------+--------+--------+--------+----------------------+--------+ ICA Mid   132     18                                             +----------+--------+--------+--------+----------------------+--------+ ICA Distal91      28                                             +----------+--------+--------+--------+----------------------+--------+ ECA       374     25      >50%                                   +----------+--------+--------+--------+----------------------+--------+ +----------+--------+--------+------------------------+-------------------+           PSV cm/sEDV cm/sDescribe                Arm Pressure (mmHG) +----------+--------+--------+------------------------+-------------------+ [id number]             atypical monophasic flow106                 +----------+--------+--------+------------------------+-------------------+ +---------+--------+--+--------+--+---------------+ VertebralPSV cm/s78EDV cm/s78Bi- directional +---------+--------+--+--------+--+---------------+ Left brachial artery antecubital fossa 43 cm/s, monophasic flow.   Findings reported to Dr. Judeth Cornfield email through [facility] at 2:15 pm .  Summary: Right Carotid: Velocities in the right ICA are consistent with a 60-79%                stenosis, these have increased since prior exam. The ECA appears                near occlusion. Left Carotid: Velocities in the left ICA are consistent with a 40-59% stenosis.               ICA velocities may be underestimated due to more proximal disease.               Hemodynamically significant plaque >50% visualized in  the CCA. The               ECA appears >50% stenosed. Vertebrals:  Right vertebral artery demonstrates antegrade flow. Left vertebral              artery demonstrates bidirectional flow. Subclavians: Right subclavian artery was stenotic and disturbed. Left subclavian              has atypical, monophasic flow. Abnormal left vertebral flow and a              left 60 mmHg lower brachial pressure is indicative of left              subclavian steal syndrome. *See table(s) above for measurements and observations.  Vascular consult recommended. Patient is scheduled to see Dr. Tanja Port 05/15/2020 at 10:40 am to go over results. Electronically signed by [name] MD on 05/14/2020 at 4:32:30 PM.    Final    Disposition   Pt is being discharged home today in good condition.  Follow-up Plans & Appointments     Discharge Instructions    Call MD for:  difficulty breathing, headache or visual disturbances   Complete by: As directed    Call MD for:  extreme fatigue   Complete by: As directed    Call MD for:  hives   Complete by: As directed    Call MD for:  persistant dizziness or light-headedness   Complete by: As directed    Call MD for:  persistant nausea and vomiting   Complete by: As directed    Call MD for:  redness, tenderness, or signs of infection (pain, swelling, redness, odor or green/yellow discharge around incision site)   Complete by: As directed    Call MD for:  severe uncontrolled pain   Complete by: As directed    Call MD for:  temperature >100.4   Complete by: As directed    Diet - low sodium heart healthy   Complete by: As directed    Discharge instructions   Complete by: As directed    PLEASE DO NOT MISS ANY DOSES OF YOUR PLAVIX!!!! Also keep a log of you blood pressures and bring back to your follow up appt. Please call the office with any questions.   Patients taking blood thinners should generally stay away from medicines like ibuprofen, Advil, Motrin, naproxen, and  Aleve due to risk of stomach bleeding. You may take Tylenol as directed or talk to your primary doctor about alternatives.  Some studies suggest Prilosec/Omeprazole interacts with Plavix. We changed your Prilosec/Omeprazole to the equivalent dose of Protonix for less chance of interaction  PLEASE ENSURE THAT YOU DO NOT RUN OUT OF YOUR PLAVIX. IF you have issues obtaining this medication due to cost please CALL the office 3-5 business days prior to running out in  order to prevent missing doses of this medication.   Strict precautions if you begin having chest or abdominal pain or evidence of bleeding to come to the ED. I am also scheduling you for follow up labs at Dr. Judeth Cornfield office. Please expect a call to schedule a day next week for this!! THIS IS IMPORTANT   Increase activity slowly   Complete by: As directed      Discharge Medications   Allergies as of 05/22/2020      Reactions   Demerol [meperidine] Nausea And Vomiting   Metoprolol    asthma   Milk-related Compounds Other (See Comments)   REFLUX (CHEESE)   Other    [place], pecans--stomach aches   Pineapple Other (See Comments)   Burning in mouth   Salmon [fish Allergy] Other (See Comments)   And tuna and oysters (PATIENT TOLERATES)   Penicillins Rash      Medication List    STOP taking these medications   omeprazole 20 MG capsule Commonly known as: PRILOSEC Replaced by: pantoprazole 40 MG tablet     TAKE these medications   albuterol 108 (90 Base) MCG/ACT inhaler Commonly known as: Ventolin HFA INHALE 2 PUFFS INTO THE LUNGS EVERY 6 (SIX) HOURS AS NEEDED FOR WHEEZING What changed:   how much to take  how to take this  when to take this  reasons to take this  additional instructions   aspirin EC 81 MG tablet Take 81 mg by mouth in the morning. Swallow whole.   atorvastatin 80 MG tablet Commonly known as: LIPITOR Take 1 tablet (80 mg total) by mouth daily. Start taking on: May 23, 2020 What  changed:   medication strength  how much to take   benazepril 40 MG tablet Commonly known as: LOTENSIN TAKE 1 TABLET BY MOUTH ONCE A DAY What changed:   when to take this  additional instructions   cetirizine 10 MG tablet Commonly known as: ZYRTEC Take 10 mg by mouth daily as needed for allergies.   citalopram 40 MG tablet Commonly known as: CELEXA TAKE 1 TABLET BY MOUTH ONCE A DAY *NEED APPT FOR MORE REFILLS* What changed:   how much to take  how to take this  when to take this   clopidogrel 75 MG tablet Commonly known as: PLAVIX Take 1 tablet (75 mg total) by mouth daily with breakfast. Start taking on: May 23, 2020   diphenhydrAMINE 25 MG tablet Commonly known as: BENADRYL Take 25-50 mg by mouth every 6 (six) hours as needed for itching (ECZEMA).   levothyroxine 75 MCG tablet Commonly known as: SYNTHROID Take 75 mcg by mouth daily before breakfast.   pantoprazole 40 MG tablet Commonly known as: PROTONIX Take 1 tablet (40 mg total) by mouth daily. Start taking on: May 23, 2020 Replaces: omeprazole 20 MG capsule   triamcinolone 0.1 % Commonly known as: KENALOG Apply 1 application topically 2 (two) times daily. What changed:   when to take this  reasons to take this   Vitamin D3 50 MCG (2000 UT) Tabs Take 2,000 Units by mouth in the morning.      Outstanding Labs/Studies   BMET, CBC  Duration of Discharge Encounter   Greater than 30 minutes including physician time.  Signed, Kathyrn Drown, NP 05/22/2020, 4:23 PM

## 2020-05-22 NOTE — Progress Notes (Signed)
Pt scheduled for staged PCI this AM @ 9. Hgb resulted 7.3; Hgb reordered by Janne Napoleon, RN for verification. 2nd Hgb resulted 7.1; No signs of bleeding or hematoma; vitals WNL. Per Jacques Navy, MD ok to hold ASA & plavix until seen by rounding MD

## 2020-05-22 NOTE — Plan of Care (Signed)
°  Problem: Education: °Goal: Understanding of CV disease, CV risk reduction, and recovery process will improve °Outcome: Progressing °  °Problem: Cardiovascular: °Goal: Ability to achieve and maintain adequate cardiovascular perfusion will improve °Outcome: Progressing °Goal: Vascular access site(s) Level 0-1 will be maintained °Outcome: Progressing °  °Problem: Health Behavior/Discharge Planning: °Goal: Ability to safely manage health-related needs after discharge will improve °Outcome: Progressing °  °

## 2020-05-25 ENCOUNTER — Telehealth: Payer: Self-pay

## 2020-05-25 ENCOUNTER — Telehealth: Payer: Self-pay | Admitting: *Deleted

## 2020-05-25 NOTE — Telephone Encounter (Signed)
Called pt to schedule outpatient PCI procedure with Dr. Kirke Corin on 4/6 and COVID test.   Left message to call back.

## 2020-05-25 NOTE — Telephone Encounter (Signed)
Transition Care Management Unsuccessful Follow-up Telephone Call  Date of discharge and from where:  05/22/2020 [facility]  Attempts:  1st Attempt  Reason for unsuccessful TCM follow-up call:  No answer/busy

## 2020-05-26 ENCOUNTER — Ambulatory Visit: Payer: Medicare Other | Admitting: Vascular Surgery

## 2020-05-26 ENCOUNTER — Encounter: Payer: Self-pay | Admitting: Vascular Surgery

## 2020-05-26 ENCOUNTER — Other Ambulatory Visit: Payer: Self-pay

## 2020-05-26 VITALS — BP 150/73 | HR 76 | Temp 97.7°F | Resp 14 | Ht 59.0 in | Wt 97.0 lb

## 2020-05-26 DIAGNOSIS — I6523 Occlusion and stenosis of bilateral carotid arteries: Secondary | ICD-10-CM | POA: Diagnosis not present

## 2020-05-26 NOTE — Progress Notes (Signed)
Patient name: Alexis [name] MRN: [id number] DOB: Feb 12, 1946 Sex: female  REASON FOR CONSULT: Evaluate carotid stenosis  HPI: EMILYANNE [name] is a 75 y.o. female, history of hypertension, hyperlipidemia, PAD with claudication, coronary artery disease that presents for evaluation of her carotid artery disease.  She has known about her carotid artery disease for some time and is followed by Dr. Cristal Deer.  She states that she recently had ultrasound that showed increasing narrowing on the right side.  She denies any history of TIAs or strokes.  No vision loss or weakness in arm or leg.  To her knowledge she has had no prior strokes in the past.  She states that she is scheduled for a high risk coronary intervention with Dr. Kirke Corin in the near future for RCA intervention.  She is on aspirin Plavix and statin.  Her most recent ultrasound on 05/14/2020 showed a 60 to 79% stenosis in the right ICA and a 40 to 59% stenosis in the left ICA.  The left vert had bidirectional flow with a monophasic flow in the subclavian.  She states she has known about her left arm blockage for years.  She has had no significant left arm symptoms.  Her right carotid stenosis has progressed from 40-59% range approximately one year ago.  No hx of neck surgery or neck radiation.  Past Medical History:  Diagnosis Date  . Anxiety   . Claudication (HCC)    stent in bil iliac 11/25/10  . Coronary artery disease   . GERD (gastroesophageal reflux disease)   . HTN (hypertension)   . Hyperlipemia   . Hypothyroidism   . Murmur, cardiac    echo 10/12/10- EF >55%, mod sclerotic aortic valve  . PVD (peripheral vascular disease) (HCC)    lower ext and upper ext-L subclavian stenosis by ultrasound, nl nuclear test- 10/27/10  . Tobacco abuse   . Vitamin D deficiency     Past Surgical History:  Procedure Laterality Date  . INTRAVASCULAR PRESSURE WIRE/FFR STUDY N/A 05/21/2020   Procedure: INTRAVASCULAR PRESSURE WIRE/FFR STUDY;   Surgeon: Runell Gess, MD;  Location: [facility];  Service: Cardiovascular;  Laterality: N/A;  . KNEE SURGERY    . LEFT HEART CATH AND CORONARY ANGIOGRAPHY N/A 05/21/2020   Procedure: LEFT HEART CATH AND CORONARY ANGIOGRAPHY;  Surgeon: Runell Gess, MD;  Location: [facility];  Service: Cardiovascular;  Laterality: N/A;  . peripheral angio  11/25/10   right iliac-6x27 Express balloon expandable stent; Left iliac 6x37  . TONSILLECTOMY      Family History  Problem Relation Age of Onset  . Allergic rhinitis Father   . Allergic rhinitis Maternal Grandmother   . Urticaria Maternal Grandmother   . Asthma Maternal Grandfather   . Allergic rhinitis Paternal Grandmother   . Angioedema Neg Hx   . Eczema Neg Hx   . Immunodeficiency Neg Hx     SOCIAL HISTORY: Social History   Socioeconomic History  . Marital status: Married    Spouse name: Not on file  . Number of children: Not on file  . Years of education: Not on file  . Highest education level: Not on file  Occupational History  . Not on file  Tobacco Use  . Smoking status: Former Smoker    Quit date: 02/28/2002    Years since quitting: 18.2  . Smokeless tobacco: Never Used  Vaping Use  . Vaping Use: Never used  Substance and Sexual Activity  .  Alcohol use: No  . Drug use: No  . Sexual activity: Not on file  Other Topics Concern  . Not on file  Social History Narrative  . Not on file   Social Determinants of Health   Financial Resource Strain: Not on file  Food Insecurity: Not on file  Transportation Needs: Not on file  Physical Activity: Not on file  Stress: Not on file  Social Connections: Not on file  Intimate Partner Violence: Not on file    Allergies  Allergen Reactions  . Demerol [Meperidine] Nausea And Vomiting  . Metoprolol     asthma  . Milk-Related Compounds Other (See Comments)    REFLUX (CHEESE)  . Other     [place], pecans--stomach aches  . Pineapple Other (See Comments)     Burning in mouth  . Salmon [Fish Allergy] Other (See Comments)    And tuna and oysters (PATIENT TOLERATES)  . Penicillins Rash    Current Outpatient Medications  Medication Sig Dispense Refill  . albuterol (VENTOLIN HFA) 108 (90 Base) MCG/ACT inhaler INHALE 2 PUFFS INTO THE LUNGS EVERY 6 (SIX) HOURS AS NEEDED FOR WHEEZING (Patient taking differently: Inhale 2 puffs into the lungs every 6 (six) hours as needed for wheezing.) 18 g 6  . aspirin EC 81 MG tablet Take 81 mg by mouth in the morning. Swallow whole.    Marland Kitchen atorvastatin (LIPITOR) 80 MG tablet Take 1 tablet (80 mg total) by mouth daily. 90 tablet 2  . benazepril (LOTENSIN) 40 MG tablet TAKE 1 TABLET BY MOUTH ONCE A DAY (Patient taking differently: Take 40 mg by mouth in the morning. TAKE 1 TABLET BY MOUTH ONCE A DAY) 90 tablet 1  . cetirizine (ZYRTEC) 10 MG tablet Take 10 mg by mouth daily as needed for allergies.    . Cholecalciferol (VITAMIN D3) 50 MCG (2000 UT) TABS Take 2,000 Units by mouth in the morning.    . citalopram (CELEXA) 40 MG tablet TAKE 1 TABLET BY MOUTH ONCE A DAY *NEED APPT FOR MORE REFILLS* (Patient taking differently: Take 40 mg by mouth in the morning. TAKE 1 TABLET BY MOUTH ONCE A DAY *NEED APPT FOR MORE REFILLS*) 30 tablet 7  . clopidogrel (PLAVIX) 75 MG tablet Take 1 tablet (75 mg total) by mouth daily with breakfast. 90 tablet 3  . diphenhydrAMINE (BENADRYL) 25 MG tablet Take 25-50 mg by mouth every 6 (six) hours as needed for itching (ECZEMA).    Marland Kitchen levothyroxine (SYNTHROID) 75 MCG tablet Take 75 mcg by mouth daily before breakfast.    . pantoprazole (PROTONIX) 40 MG tablet Take 1 tablet (40 mg total) by mouth daily. 90 tablet 3  . triamcinolone cream (KENALOG) 0.1 % Apply 1 application topically 2 (two) times daily. (Patient taking differently: Apply 1 application topically 2 (two) times daily as needed (ECZEMA).) 30 g 3   No current facility-administered medications for this visit.    REVIEW OF SYSTEMS:  [X]   denotes positive finding, [ ]  denotes negative finding Cardiac  Comments:  Chest pain or chest pressure:    Shortness of breath upon exertion: x   Short of breath when lying flat:    Irregular heart rhythm:        Vascular    Pain in calf, thigh, or hip brought on by ambulation:    Pain in feet at night that wakes you up from your sleep:     Blood clot in your veins:    Leg swelling:  Pulmonary    Oxygen at home:    Productive cough:     Wheezing:         Neurologic    Sudden weakness in arms or legs:     Sudden numbness in arms or legs:     Sudden onset of difficulty speaking or slurred speech:    Temporary loss of vision in one eye:     Problems with dizziness:         Gastrointestinal    Blood in stool:     Vomited blood:         Genitourinary    Burning when urinating:     Blood in urine:        Psychiatric    Major depression:         Hematologic    Bleeding problems:    Problems with blood clotting too easily:        Skin    Rashes or ulcers:        Constitutional    Fever or chills:      PHYSICAL EXAM: Vitals:   05/26/20 1024  BP: (!) 150/73  Pulse: 76  Resp: 14  Temp: 97.7 F (36.5 C)  TempSrc: Temporal  SpO2: 94%  Weight: 97 lb (44 kg)  Height: 4\' 11"  (1.499 m)    GENERAL: The patient is a well-nourished female, in no acute distress. The vital signs are documented above. CARDIAC: There is a regular rate and rhythm.  VASCULAR:  Right radial pulse palpable, left radial pulse nonpalpable Bilateral femoral pulses palpable Bilateral DP pulses palpable PULMONARY: There is good air exchange bilaterally without wheezing or rales. ABDOMEN: Soft and non-tender. MUSCULOSKELETAL: There are no major deformities or cyanosis. NEUROLOGIC: No focal weakness or paresthesias are detected. SKIN: There are no ulcers or rashes noted. PSYCHIATRIC: The patient has a normal affect.  DATA:   Carotid duplex 05/14/2020 shows 60-79% right ICA stenosis  with a 40-59% left ICA stenosis.  Left vertebral artery has bidirectional flow with monophasic flow in the left subclavian.  Assessment/Plan:  75 year old female presents for evaluation of asymptomatic carotid disease.  Her right ICA is the higher grade stenosis at 60 to 79% and this has progressed from 40-59% stenosis one year ago.  I discussed with her that in the setting of asymptomatic disease we recommend medical management unless there is more than 80% stenosis.  She is on aspirin Plavix and statin for appropriate risk reduction.  I recommend follow-up in 6 months with carotid duplex here in the office.  She does have monophasic flow in the left subclavian with bidirectional flow in the left vert but not having any posterior basilar insufficiency symptoms or significant left arm symptoms to warrant intervention.   72, MD [facility] Office: [phone]

## 2020-05-26 NOTE — Telephone Encounter (Signed)
Attempted to contact pt x 2. Left message on pt's phone and husband cell.

## 2020-05-26 NOTE — Telephone Encounter (Signed)
Transition Care Management Unsuccessful Follow-up Telephone Call  Date of discharge and from where:  05/22/2020 [facility]  Attempts:  2nd Attempt  Reason for unsuccessful TCM follow-up call:  No answer/busy

## 2020-05-27 ENCOUNTER — Other Ambulatory Visit: Payer: Self-pay

## 2020-05-27 DIAGNOSIS — I6523 Occlusion and stenosis of bilateral carotid arteries: Secondary | ICD-10-CM

## 2020-05-27 NOTE — Telephone Encounter (Signed)
Transition Care Management Follow-up Telephone Call  Date of discharge and from where: 05/22/2020 from [facility]  How have you been since you were released from the hospital? Pt stated that she is feeling well and has no questions or concerns.   Any questions or concerns? No  Items Reviewed:  Did the pt receive and understand the discharge instructions provided? Yes   Medications obtained and verified? Yes   Other? No   Any new allergies since your discharge? No   Dietary orders reviewed? n/a  Do you have support at home? Yes   Functional Questionnaire: (I = Independent and D = Dependent) ADLs: I  Bathing/Dressing- I  Meal Prep- I  Eating- I  Maintaining continence- I  Transferring/Ambulation- I  Managing Meds- I   Follow up appointments reviewed:   PCP Hospital f/u appt confirmed? No    Specialist Hospital f/u appt confirmed? Yes  Scheduled to see Cardiology on 06/11/2020 @ 02:45pm.  Are transportation arrangements needed? No   If their condition worsens, is the pt aware to call PCP or go to the Emergency Dept.? Yes  Was the patient provided with contact information for the PCP's office or ED? Yes  Was to pt encouraged to call back with questions or concerns? Yes

## 2020-05-28 NOTE — Telephone Encounter (Signed)
Spoke with pt's daughter on 3/29 who report she would try to reach pt and have her contact office. Nurse still have not received a call back. Attempted to call again but still received no answer.

## 2020-05-29 ENCOUNTER — Other Ambulatory Visit: Payer: Self-pay

## 2020-05-29 DIAGNOSIS — I6523 Occlusion and stenosis of bilateral carotid arteries: Secondary | ICD-10-CM

## 2020-05-29 DIAGNOSIS — Z79899 Other long term (current) drug therapy: Secondary | ICD-10-CM

## 2020-05-29 DIAGNOSIS — D649 Anemia, unspecified: Secondary | ICD-10-CM | POA: Diagnosis not present

## 2020-05-29 LAB — BASIC METABOLIC PANEL
BUN/Creatinine Ratio: 19 (ref 12–28)
BUN: 14 mg/dL (ref 8–27)
CO2: 25 mmol/L (ref 20–29)
Calcium: 9 mg/dL (ref 8.7–10.3)
Chloride: 103 mmol/L (ref 96–106)
Creatinine, Ser: 0.74 mg/dL (ref 0.57–1.00)
Glucose: 63 mg/dL — ABNORMAL LOW (ref 65–99)
Potassium: 3.9 mmol/L (ref 3.5–5.2)
Sodium: 142 mmol/L (ref 134–144)
eGFR: 84 mL/min/{1.73_m2} (ref >59)

## 2020-05-29 LAB — CBC
Hematocrit: 26 % — ABNORMAL LOW (ref 34.0–46.6)
Hemoglobin: 8.3 g/dL — ABNORMAL LOW (ref 11.1–15.9)
MCH: 28.5 pg (ref 26.6–33.0)
MCHC: 31.9 g/dL (ref 31.5–35.7)
MCV: 89 fL (ref 79–97)
Platelets: 262 10*3/uL (ref 150–450)
RBC: 2.91 x10E6/uL — ABNORMAL LOW (ref 3.77–5.28)
RDW: 15 % (ref 11.7–15.4)
WBC: 4.9 10*3/uL (ref 3.4–10.8)

## 2020-06-01 ENCOUNTER — Telehealth: Payer: Self-pay | Admitting: Cardiology

## 2020-06-01 DIAGNOSIS — Z01812 Encounter for preprocedural laboratory examination: Secondary | ICD-10-CM

## 2020-06-01 DIAGNOSIS — R079 Chest pain, unspecified: Secondary | ICD-10-CM

## 2020-06-01 NOTE — Telephone Encounter (Signed)
Please see updated encounter  

## 2020-06-01 NOTE — Telephone Encounter (Signed)
Patient calling  States she is suppose to have a stent on 04/06 but has not heard anything about it  Please call to discuss

## 2020-06-01 NOTE — Telephone Encounter (Signed)
PCI procedure scheduled and pt provided instructions for procedure and COVID test.    [facility] CARDIOVASCULAR DIVISION [facility] [address] [place] [address] Dept: [phone] Loc: [phone]  Alexis [name]  06/01/2020  You are scheduled for a Cardiac Catheterization on Wednesday, April 6 with Dr. Lorine Bears.  1. Please arrive at the [facility] (Main Entrance A) at [facility]: [address] [place], [place] [address] at 12:00 PM (This time is two hours before your procedure to ensure your preparation). Free valet parking service is available.   Special note: Every effort is made to have your procedure done on time. Please understand that emergencies sometimes delay scheduled procedures.  2. Diet: Do not eat solid foods after midnight.  The patient may have clear liquids until 5am upon the day of the procedure.  3. Labs: You will need to have blood drawn on Tuesday 06/01/20 ( CBC, BMP)   4. Medication instructions in preparation for your procedure:   Contrast Allergy: No  On the morning of your procedure, take your Plavix/Clopidogrel and any morning medicines NOT listed above.  You may use sips of water.  5. Plan for one night stay--bring personal belongings. 6. Bring a current list of your medications and current insurance cards. 7. You MUST have a responsible person to drive you home. 8. Someone MUST be with you the first 24 hours after you arrive home or your discharge will be delayed. 9. Please wear clothes that are easy to get on and off and wear slip-on shoes.  Thank you for allowing [place] to care for you!   --  Invasive Cardiovascular services  You will need to have the coronavirus test completed prior to your procedure. An appointment has been made at 8:25 am on Tuesday 06/01/20. This is a Drive Up Visit at [address] [place], [place], [place] [address]. Please tell them that you are  there for procedure testing. Stay in your car and someone will be with you shortly. Please make sure to have all other labs completed before this test because you will need to stay quarantined until your procedure.

## 2020-06-01 NOTE — Telephone Encounter (Signed)
Will forward to [employer] RN who has tried to reach patient on 3 different days

## 2020-06-02 ENCOUNTER — Telehealth: Payer: Self-pay | Admitting: *Deleted

## 2020-06-02 ENCOUNTER — Other Ambulatory Visit (HOSPITAL_COMMUNITY)
Admission: RE | Admit: 2020-06-02 | Discharge: 2020-06-02 | Disposition: A | Discharge status: Home / Self Care | Payer: Medicare Other | Source: Ambulatory Visit | Attending: Cardiovascular Disease | Admitting: Cardiovascular Disease

## 2020-06-02 DIAGNOSIS — Z20822 Contact with and (suspected) exposure to covid-19: Secondary | ICD-10-CM | POA: Insufficient documentation

## 2020-06-02 DIAGNOSIS — Z01812 Encounter for preprocedural laboratory examination: Secondary | ICD-10-CM | POA: Diagnosis not present

## 2020-06-02 DIAGNOSIS — R079 Chest pain, unspecified: Secondary | ICD-10-CM | POA: Diagnosis not present

## 2020-06-02 LAB — CBC
Hematocrit: 29.9 % — ABNORMAL LOW (ref 34.0–46.6)
Hemoglobin: 9.5 g/dL — ABNORMAL LOW (ref 11.1–15.9)
MCH: 27.5 pg (ref 26.6–33.0)
MCHC: 31.8 g/dL (ref 31.5–35.7)
MCV: 87 fL (ref 79–97)
Platelets: 372 10*3/uL (ref 150–450)
RBC: 3.45 x10E6/uL — ABNORMAL LOW (ref 3.77–5.28)
RDW: 17 % — ABNORMAL HIGH (ref 11.7–15.4)
WBC: 6.8 10*3/uL (ref 3.4–10.8)

## 2020-06-02 LAB — BASIC METABOLIC PANEL
BUN/Creatinine Ratio: 22 (ref 12–28)
BUN: 17 mg/dL (ref 8–27)
CO2: 29 mmol/L (ref 20–29)
Calcium: 9.5 mg/dL (ref 8.7–10.3)
Chloride: 102 mmol/L (ref 96–106)
Creatinine, Ser: 0.78 mg/dL (ref 0.57–1.00)
Glucose: 83 mg/dL (ref 65–99)
Potassium: 3.9 mmol/L (ref 3.5–5.2)
Sodium: 140 mmol/L (ref 134–144)
eGFR: 79 mL/min/{1.73_m2} (ref >59)

## 2020-06-02 LAB — SARS CORONAVIRUS 2 (TAT 6-24 HRS): SARS Coronavirus 2: NEGATIVE

## 2020-06-02 NOTE — Telephone Encounter (Signed)
Ok thanks 

## 2020-06-02 NOTE — Telephone Encounter (Signed)
Pt contacted pre-coronary stent intervention scheduled at [facility] for: Wednesday June 03, 2020 2 PM Verified arrival time and place: [facility] Main Entrance [facility]) at: 12 Noon   No solid food after midnight prior to cath, clear liquids until 5 AM day of procedure.   AM meds can be  taken pre-cath with sips of water including: ASA 81 mg Plavix 75 mg  Confirmed patient has responsible adult to drive home post procedure and be with patient first 24 hours after arriving home: yes  You are allowed ONE visitor in the waiting room during the time you are at the hospital for your procedure. Both you and your visitor must wear a mask once you enter the hospital.   Reviewed procedure/mask/visitor instructions with patient.    06/02/20 Hgb 9.5, improved from 8.3 (05/29/20) and 7.1 (05/22/20).              Pt reports she does feel she has more energy and does not have any signs/symptoms of active bleeding.

## 2020-06-03 ENCOUNTER — Ambulatory Visit (HOSPITAL_COMMUNITY)
Admission: RE | Admit: 2020-06-03 | Discharge: 2020-06-04 | Disposition: A | Discharge status: Home / Self Care | Payer: Medicare Other | Source: Ambulatory Visit | Attending: Cardiovascular Disease | Admitting: Cardiovascular Disease

## 2020-06-03 ENCOUNTER — Other Ambulatory Visit: Payer: Self-pay

## 2020-06-03 ENCOUNTER — Encounter (HOSPITAL_COMMUNITY)
Admission: RE | Disposition: A | Discharge status: Home / Self Care | Payer: Self-pay | Source: Ambulatory Visit | Attending: Cardiovascular Disease

## 2020-06-03 DIAGNOSIS — K219 Gastro-esophageal reflux disease without esophagitis: Secondary | ICD-10-CM | POA: Insufficient documentation

## 2020-06-03 DIAGNOSIS — Z955 Presence of coronary angioplasty implant and graft: Secondary | ICD-10-CM | POA: Diagnosis not present

## 2020-06-03 DIAGNOSIS — Z885 Allergy status to narcotic agent status: Secondary | ICD-10-CM | POA: Insufficient documentation

## 2020-06-03 DIAGNOSIS — Z79899 Other long term (current) drug therapy: Secondary | ICD-10-CM | POA: Diagnosis not present

## 2020-06-03 DIAGNOSIS — Z7982 Long term (current) use of aspirin: Secondary | ICD-10-CM | POA: Diagnosis not present

## 2020-06-03 DIAGNOSIS — Z880 Allergy status to penicillin: Secondary | ICD-10-CM | POA: Insufficient documentation

## 2020-06-03 DIAGNOSIS — E785 Hyperlipidemia, unspecified: Secondary | ICD-10-CM | POA: Diagnosis present

## 2020-06-03 DIAGNOSIS — Z79890 Hormone replacement therapy (postmenopausal): Secondary | ICD-10-CM | POA: Diagnosis not present

## 2020-06-03 DIAGNOSIS — I779 Disorder of arteries and arterioles, unspecified: Secondary | ICD-10-CM | POA: Diagnosis present

## 2020-06-03 DIAGNOSIS — I6529 Occlusion and stenosis of unspecified carotid artery: Secondary | ICD-10-CM | POA: Insufficient documentation

## 2020-06-03 DIAGNOSIS — I25110 Atherosclerotic heart disease of native coronary artery with unstable angina pectoris: Secondary | ICD-10-CM | POA: Diagnosis not present

## 2020-06-03 DIAGNOSIS — I739 Peripheral vascular disease, unspecified: Secondary | ICD-10-CM | POA: Diagnosis not present

## 2020-06-03 DIAGNOSIS — Z7902 Long term (current) use of antithrombotics/antiplatelets: Secondary | ICD-10-CM | POA: Diagnosis not present

## 2020-06-03 DIAGNOSIS — I771 Stricture of artery: Secondary | ICD-10-CM | POA: Diagnosis present

## 2020-06-03 DIAGNOSIS — I2510 Atherosclerotic heart disease of native coronary artery without angina pectoris: Secondary | ICD-10-CM

## 2020-06-03 DIAGNOSIS — I25119 Atherosclerotic heart disease of native coronary artery with unspecified angina pectoris: Secondary | ICD-10-CM

## 2020-06-03 DIAGNOSIS — D649 Anemia, unspecified: Secondary | ICD-10-CM | POA: Insufficient documentation

## 2020-06-03 DIAGNOSIS — I209 Angina pectoris, unspecified: Secondary | ICD-10-CM | POA: Diagnosis present

## 2020-06-03 DIAGNOSIS — I10 Essential (primary) hypertension: Secondary | ICD-10-CM | POA: Diagnosis present

## 2020-06-03 HISTORY — PX: CORONARY STENT INTERVENTION: CATH118234

## 2020-06-03 HISTORY — PX: INTRAVASCULAR LITHOTRIPSY: CATH118324

## 2020-06-03 SURGERY — CORONARY STENT INTERVENTION
Anesthesia: LOCAL

## 2020-06-03 MED ORDER — HEPARIN (PORCINE) IN NACL 1000-0.9 UT/500ML-% IV SOLN
INTRAVENOUS | Status: AC
  Filled 2020-06-03: qty 1000

## 2020-06-03 MED ORDER — SODIUM CHLORIDE 0.9 % WEIGHT BASED INFUSION: 1.0000 mL/kg/h | INTRAVENOUS | Status: AC

## 2020-06-03 MED ORDER — BIVALIRUDIN TRIFLUOROACETATE 250 MG IV SOLR
INTRAVENOUS | Status: AC
  Filled 2020-06-03: qty 250

## 2020-06-03 MED ORDER — FENTANYL CITRATE (PF) 100 MCG/2ML IJ SOLN
INTRAMUSCULAR | Status: AC
  Filled 2020-06-03: qty 2

## 2020-06-03 MED ORDER — DIPHENHYDRAMINE HCL 25 MG PO TABS
25.0000 mg | ORAL_TABLET | Freq: Four times a day (QID) | ORAL | Status: DC | PRN
  Filled 2020-06-03: qty 2

## 2020-06-03 MED ORDER — SODIUM CHLORIDE 0.9% FLUSH: 3.0000 mL | Freq: Two times a day (BID) | INTRAVENOUS | Status: DC

## 2020-06-03 MED ORDER — IOHEXOL 350 MG/ML SOLN
INTRAVENOUS | Status: AC
  Filled 2020-06-03: qty 1

## 2020-06-03 MED ORDER — HYDRALAZINE HCL 20 MG/ML IJ SOLN: 10.0000 mg | INTRAMUSCULAR | Status: AC | PRN

## 2020-06-03 MED ORDER — SODIUM CHLORIDE 0.9 % WEIGHT BASED INFUSION
3.0000 mL/kg/h | INTRAVENOUS | Status: DC
  Administered 2020-06-03: 3 mL/kg/h via INTRAVENOUS

## 2020-06-03 MED ORDER — BENAZEPRIL HCL 40 MG PO TABS
40.0000 mg | ORAL_TABLET | Freq: Every day | ORAL | Status: DC
  Administered 2020-06-04: 40 mg via ORAL
  Filled 2020-06-03: qty 1

## 2020-06-03 MED ORDER — ACETAMINOPHEN 325 MG PO TABS: 650.0000 mg | ORAL_TABLET | ORAL | Status: DC | PRN

## 2020-06-03 MED ORDER — PANTOPRAZOLE SODIUM 40 MG PO TBEC
40.0000 mg | DELAYED_RELEASE_TABLET | Freq: Every day | ORAL | Status: DC
  Administered 2020-06-04: 40 mg via ORAL
  Filled 2020-06-03: qty 1

## 2020-06-03 MED ORDER — MIDAZOLAM HCL 2 MG/2ML IJ SOLN
INTRAMUSCULAR | Status: DC | PRN
  Administered 2020-06-03 (×2): 1 mg via INTRAVENOUS

## 2020-06-03 MED ORDER — DIPHENHYDRAMINE HCL 25 MG PO CAPS: 25.0000 mg | ORAL_CAPSULE | Freq: Four times a day (QID) | ORAL | Status: DC | PRN

## 2020-06-03 MED ORDER — ASPIRIN EC 81 MG PO TBEC: 81.0000 mg | DELAYED_RELEASE_TABLET | Freq: Every morning | ORAL | Status: DC

## 2020-06-03 MED ORDER — CITALOPRAM HYDROBROMIDE 20 MG PO TABS
40.0000 mg | ORAL_TABLET | Freq: Every day | ORAL | Status: DC
  Administered 2020-06-04: 40 mg via ORAL
  Filled 2020-06-03: qty 2

## 2020-06-03 MED ORDER — HEPARIN (PORCINE) IN NACL 1000-0.9 UT/500ML-% IV SOLN
INTRAVENOUS | Status: DC | PRN
  Administered 2020-06-03 (×2): 500 mL

## 2020-06-03 MED ORDER — SODIUM CHLORIDE 0.9% FLUSH: 3.0000 mL | INTRAVENOUS | Status: DC | PRN

## 2020-06-03 MED ORDER — LEVOTHYROXINE SODIUM 75 MCG PO TABS
75.0000 ug | ORAL_TABLET | Freq: Every day | ORAL | Status: DC
  Administered 2020-06-04: 75 ug via ORAL
  Filled 2020-06-03: qty 1

## 2020-06-03 MED ORDER — MIDAZOLAM HCL 2 MG/2ML IJ SOLN
INTRAMUSCULAR | Status: AC
  Filled 2020-06-03: qty 2

## 2020-06-03 MED ORDER — SODIUM CHLORIDE 0.9 % IV SOLN: 250.0000 mL | INTRAVENOUS | Status: DC | PRN

## 2020-06-03 MED ORDER — NITROGLYCERIN 1 MG/10 ML FOR IR/CATH LAB
INTRA_ARTERIAL | Status: DC | PRN
  Administered 2020-06-03: 200 ug via INTRACORONARY

## 2020-06-03 MED ORDER — SODIUM CHLORIDE 0.9 % WEIGHT BASED INFUSION
1.0000 mL/kg/h | INTRAVENOUS | Status: DC
  Administered 2020-06-03: 250 mL via INTRAVENOUS
  Administered 2020-06-03: 1 mL/kg/h via INTRAVENOUS

## 2020-06-03 MED ORDER — LIDOCAINE HCL (PF) 1 % IJ SOLN
INTRAMUSCULAR | Status: DC | PRN
  Administered 2020-06-03: 15 mL via SUBCUTANEOUS

## 2020-06-03 MED ORDER — ASPIRIN 81 MG PO CHEW: 81.0000 mg | CHEWABLE_TABLET | ORAL | Status: DC

## 2020-06-03 MED ORDER — SODIUM CHLORIDE 0.9 % IV SOLN
INTRAVENOUS | Status: DC | PRN
  Administered 2020-06-03: 1.75 mg/kg/h via INTRAVENOUS

## 2020-06-03 MED ORDER — ONDANSETRON HCL 4 MG/2ML IJ SOLN: 4.0000 mg | Freq: Four times a day (QID) | INTRAMUSCULAR | Status: DC | PRN

## 2020-06-03 MED ORDER — CLOPIDOGREL BISULFATE 75 MG PO TABS
75.0000 mg | ORAL_TABLET | Freq: Every day | ORAL | Status: DC
  Administered 2020-06-04: 75 mg via ORAL
  Filled 2020-06-03: qty 1

## 2020-06-03 MED ORDER — ATORVASTATIN CALCIUM 80 MG PO TABS: 80.0000 mg | ORAL_TABLET | Freq: Every day | ORAL | Status: DC

## 2020-06-03 MED ORDER — NITROGLYCERIN 1 MG/10 ML FOR IR/CATH LAB
INTRA_ARTERIAL | Status: AC
  Filled 2020-06-03: qty 10

## 2020-06-03 MED ORDER — LIDOCAINE HCL (PF) 1 % IJ SOLN
INTRAMUSCULAR | Status: AC
  Filled 2020-06-03: qty 30

## 2020-06-03 MED ORDER — VITAMIN D 25 MCG (1000 UNIT) PO TABS
2000.0000 [IU] | ORAL_TABLET | Freq: Every day | ORAL | Status: DC
  Administered 2020-06-04: 2000 [IU] via ORAL
  Filled 2020-06-03: qty 2

## 2020-06-03 MED ORDER — CLOPIDOGREL BISULFATE 75 MG PO TABS: 75.0000 mg | ORAL_TABLET | ORAL | Status: DC

## 2020-06-03 MED ORDER — FENTANYL CITRATE (PF) 100 MCG/2ML IJ SOLN
INTRAMUSCULAR | Status: DC | PRN
  Administered 2020-06-03 (×2): 25 ug via INTRAVENOUS

## 2020-06-03 MED ORDER — BIVALIRUDIN BOLUS VIA INFUSION - CUPID
INTRAVENOUS | Status: DC | PRN
  Administered 2020-06-03: 33 mg via INTRAVENOUS

## 2020-06-03 MED ORDER — ATORVASTATIN CALCIUM 80 MG PO TABS
80.0000 mg | ORAL_TABLET | Freq: Every day | ORAL | Status: DC
  Administered 2020-06-03 – 2020-06-04 (×2): 80 mg via ORAL
  Filled 2020-06-03 (×2): qty 1

## 2020-06-03 MED ORDER — ALBUTEROL SULFATE HFA 108 (90 BASE) MCG/ACT IN AERS
2.0000 | INHALATION_SPRAY | Freq: Four times a day (QID) | RESPIRATORY_TRACT | Status: DC | PRN
  Filled 2020-06-03: qty 6.7

## 2020-06-03 SURGICAL SUPPLY — 28 items
BALLN SAPPHIRE 2.0X12 (BALLOONS) ×3
BALLN SAPPHIRE 2.5X12 (BALLOONS) ×3
BALLN SAPPHIRE ~~LOC~~ 2.5X12 (BALLOONS) ×3 IMPLANT
BALLN SAPPHIRE ~~LOC~~ 2.5X8 (BALLOONS) ×3 IMPLANT
BALLN SAPPHIRE ~~LOC~~ 3.75X10 (BALLOONS) ×3 IMPLANT
BALLN WOLVERINE 3.00X10 (BALLOONS) ×3
BALLOON SAPPHIRE 2.0X12 (BALLOONS) ×2 IMPLANT
BALLOON SAPPHIRE 2.5X12 (BALLOONS) ×2 IMPLANT
BALLOON WOLVERINE 3.00X10 (BALLOONS) ×2 IMPLANT
CATH LAUNCHER 6FR 3DRC (CATHETERS) ×2 IMPLANT
CATH SHOCKWAVE 3.0X12 (CATHETERS) ×2 IMPLANT
CATH TELESCOPE 6F GEC (CATHETERS) ×3 IMPLANT
CATHETER LAUNCHER 6FR 3DRC (CATHETERS) ×3
CATHETER SHOCKWAVE 3.0X12 (CATHETERS) ×3
DEVICE CLOSURE MYNXGRIP 6/7F (Vascular Products) ×3 IMPLANT
KIT ENCORE 26 ADVANTAGE (KITS) ×3 IMPLANT
KIT HEART LEFT (KITS) ×3 IMPLANT
KIT MICROPUNCTURE NIT STIFF (SHEATH) ×3 IMPLANT
PACK CARDIAC CATHETERIZATION (CUSTOM PROCEDURE TRAY) ×3 IMPLANT
SHEATH BRITE TIP 6FR 35CM (SHEATH) ×3 IMPLANT
SHEATH PINNACLE 6F 10CM (SHEATH) ×3 IMPLANT
SHEATH PROBE COVER 6X72 (BAG) ×3 IMPLANT
STENT RESOLUTE ONYX 2.25X12 (Permanent Stent) ×3 IMPLANT
STENT RESOLUTE ONYX 3.5X12 (Permanent Stent) ×3 IMPLANT
TRANSDUCER W/STOPCOCK (MISCELLANEOUS) ×3 IMPLANT
TUBING CIL FLEX 10 FLL-RA (TUBING) ×3 IMPLANT
WIRE EMERALD 3MM-J .035X150CM (WIRE) ×3 IMPLANT
WIRE RUNTHROUGH .014X180CM (WIRE) ×3 IMPLANT

## 2020-06-03 NOTE — Interval H&P Note (Signed)
Cath Lab Visit (complete for each Cath Lab visit)  Clinical Evaluation Leading to the Procedure:   ACS: No.  Non-ACS:    Anginal Classification: CCS III  Anti-ischemic medical therapy: Maximal Therapy (2 or more classes of medications)  Non-Invasive Test Results: Equivocal test results  Prior CABG: No previous CABG      History and Physical Interval Note:  06/03/2020 3:58 PM  Alexis [name]  has presented today for surgery, with the diagnosis of chest pain.  The various methods of treatment have been discussed with the patient and family. After consideration of risks, benefits and other options for treatment, the patient has consented to  Procedure(s): CORONARY STENT INTERVENTION (N/A) as a surgical intervention.  The patient's history has been reviewed, patient examined, no change in status, stable for surgery.  I have reviewed the patient's chart and labs.  Questions were answered to the patient's satisfaction.     Lorine Bears

## 2020-06-04 ENCOUNTER — Encounter (HOSPITAL_COMMUNITY): Payer: Self-pay | Admitting: Cardiovascular Disease

## 2020-06-04 DIAGNOSIS — K219 Gastro-esophageal reflux disease without esophagitis: Secondary | ICD-10-CM | POA: Diagnosis not present

## 2020-06-04 DIAGNOSIS — I771 Stricture of artery: Secondary | ICD-10-CM | POA: Diagnosis not present

## 2020-06-04 DIAGNOSIS — E785 Hyperlipidemia, unspecified: Secondary | ICD-10-CM | POA: Diagnosis not present

## 2020-06-04 DIAGNOSIS — Z955 Presence of coronary angioplasty implant and graft: Secondary | ICD-10-CM | POA: Diagnosis not present

## 2020-06-04 DIAGNOSIS — Z7902 Long term (current) use of antithrombotics/antiplatelets: Secondary | ICD-10-CM | POA: Diagnosis not present

## 2020-06-04 DIAGNOSIS — I25110 Atherosclerotic heart disease of native coronary artery with unstable angina pectoris: Secondary | ICD-10-CM

## 2020-06-04 DIAGNOSIS — I739 Peripheral vascular disease, unspecified: Secondary | ICD-10-CM | POA: Diagnosis not present

## 2020-06-04 DIAGNOSIS — Z7982 Long term (current) use of aspirin: Secondary | ICD-10-CM | POA: Diagnosis not present

## 2020-06-04 DIAGNOSIS — Z79899 Other long term (current) drug therapy: Secondary | ICD-10-CM | POA: Diagnosis not present

## 2020-06-04 DIAGNOSIS — I6529 Occlusion and stenosis of unspecified carotid artery: Secondary | ICD-10-CM | POA: Diagnosis not present

## 2020-06-04 DIAGNOSIS — Z885 Allergy status to narcotic agent status: Secondary | ICD-10-CM | POA: Diagnosis not present

## 2020-06-04 DIAGNOSIS — D649 Anemia, unspecified: Secondary | ICD-10-CM | POA: Diagnosis not present

## 2020-06-04 DIAGNOSIS — Z880 Allergy status to penicillin: Secondary | ICD-10-CM | POA: Diagnosis not present

## 2020-06-04 DIAGNOSIS — I10 Essential (primary) hypertension: Secondary | ICD-10-CM | POA: Diagnosis not present

## 2020-06-04 LAB — CBC
HCT: 27.7 % — ABNORMAL LOW (ref 36.0–46.0)
Hemoglobin: 8.4 g/dL — ABNORMAL LOW (ref 12.0–15.0)
MCH: 28.3 pg (ref 26.0–34.0)
MCHC: 30.3 g/dL (ref 30.0–36.0)
MCV: 93.3 fL (ref 80.0–100.0)
Platelets: 303 10*3/uL (ref 150–400)
RBC: 2.97 MIL/uL — ABNORMAL LOW (ref 3.87–5.11)
RDW: 18.5 % — ABNORMAL HIGH (ref 11.5–15.5)
WBC: 6.8 10*3/uL (ref 4.0–10.5)
nRBC: 0 % (ref 0.0–0.2)

## 2020-06-04 LAB — BASIC METABOLIC PANEL
Anion gap: 8 (ref 5–15)
BUN: 9 mg/dL (ref 8–23)
CO2: 27 mmol/L (ref 22–32)
Calcium: 8.6 mg/dL — ABNORMAL LOW (ref 8.9–10.3)
Chloride: 103 mmol/L (ref 98–111)
Creatinine, Ser: 0.75 mg/dL (ref 0.44–1.00)
GFR, Estimated: >60 mL/min (ref >60)
Glucose, Bld: 74 mg/dL (ref 70–99)
Potassium: 3.3 mmol/L — ABNORMAL LOW (ref 3.5–5.1)
Sodium: 138 mmol/L (ref 135–145)

## 2020-06-04 LAB — POCT ACTIVATED CLOTTING TIME: Activated Clotting Time: 422 seconds

## 2020-06-04 MED ORDER — POTASSIUM CHLORIDE CRYS ER 20 MEQ PO TBCR
60.0000 meq | EXTENDED_RELEASE_TABLET | Freq: Once | ORAL | Status: AC
  Administered 2020-06-04: 60 meq via ORAL
  Filled 2020-06-04: qty 3

## 2020-06-04 MED ORDER — NITROGLYCERIN 0.4 MG SL SUBL: 0.4000 mg | SUBLINGUAL_TABLET | SUBLINGUAL | 3 refills | Status: DC | PRN

## 2020-06-04 NOTE — Plan of Care (Signed)
  Problem: Cardiovascular: Goal: Ability to achieve and maintain adequate cardiovascular perfusion will improve Outcome: Progressing Goal: Vascular access site(s) Level 0-1 will be maintained Outcome: Progressing   Problem: Clinical Measurements: Goal: Ability to maintain clinical measurements within normal limits will improve Outcome: Progressing Goal: Will remain free from infection Outcome: Progressing Goal: Diagnostic test results will improve Outcome: Progressing Goal: Respiratory complications will improve Outcome: Progressing Goal: Cardiovascular complication will be avoided Outcome: Progressing   

## 2020-06-04 NOTE — Discharge Summary (Addendum)
Discharge Summary    Patient ID: LARSEN ZETTEL MRN: [id number]; DOB: 04/07/1945  Admit date: 06/03/2020 Discharge date: 06/04/2020  PCP:  Salley Scarlet, MD   [facility]  Cardiologist:  Jodelle Red, MD  Advanced Practice Provider:  No care team member to display Electrophysiologist:  None   Discharge Diagnoses    Principal Problem:   Coronary artery disease involving native coronary artery of native heart with unstable angina pectoris [facility]) Active Problems:   Carotid artery disease (HCC)   Subclavian artery stenosis, left (HCC)   Hyperlipemia   HTN (hypertension)   Angina pectoris (HCC)    Diagnostic Studies/Procedures    Coronary stent intervention 06/03/20:  Ost RCA to Prox RCA lesion is 99% stenosed.  Mid RCA lesion is 75% stenosed.  Post intervention, there is a 0% residual stenosis.  A drug-eluting stent was successfully placed using a STENT RESOLUTE ONYX 2.25X12.  Post intervention, there is a 15% residual stenosis.  A drug-eluting stent was successfully placed using a STENT RESOLUTE ONYX 3.5X12.   Successful but very difficult angioplasty, intravascular lithotripsy and drug-coated balloon angioplasty to the ostial right coronary artery as well as drug-eluting stent placement to the mid right coronary artery.  Very difficult procedure due to calcifications, ostial stenosis and tortuosity.  Recommendations: Dual antiplatelet therapy for at least 12 months. Aggressive treatment of risk factors. Ostial RCA stent is high risk for in-stent restenosis.  Diagnostic Dominance: Right    Intervention     _____________   History of Present Illness     SUZANNAH BETTES is a 75 y.o. female with a PMH of PAD (carotid artery disease, subclavian artery stenosis, and claudication), HTN, HLD, and asthma.   She was recently admitted to the hospital from 3/24-3/25/22 after presenting for an outpatient cardiac catheterization to  further evaluate recent chest tightness with exertion and SOB. She underwent LHC 05/21/20 which showed Ost RCA topRCA lesion is 99% stenosed, mRCA lesion is 75% stenosed,Ost LM to Mid LM lesion is 40% stenosed,Ost Cx to Prox Cx lesion is 60% stenosed. 40% distal left main stenosis that did not appear to be physiologically significant by RFR analysis. Plan was for PCI intervention 05/22/20 however Hb overnight went from 9.3>>>7.1 this AM. Case was ultimately cancelled until Hb more stable. She had no recurrent chest pain. Cath site stable with no s/s of hematoma or active bleeding. She had no c/o of abdominal or chest pain with unclear etiology. She was continued on Plavix and ASA. Hb felt to be dilutional due to IVF hydration. Ultimately she was discharged home with plans to repeat labs in 1 week and staged intervention 06/03/20 for which she presents at this time.   Hospital Course     Consultants: None   1. CAD s/p PCI/DES to RCA 06/03/20: patient presented for staged intervention 06/03/20. She underwent successful but very difficult angioplasty, intravascular lithotripsy and drug-coated balloon angioplasty to ostial RCA with placement of DES x2 to ostial RCA and mRCA lesions. Noted to be high risk for ISR of ostial RCA stent. She was recommended for uninterrupted DAPT with aspirin and plavix for 12 months. Right groin cath site was without hematoma with dressing C/D/I on the day of discharge. Not on BBlocker due to documented allergy to metoprolol (asthma?). Outpatient echo scheduled for 06/11/20. - Continue aspirin and plavix - Continue statin - Rx for SL nitro sent  2. HTN: BP somewhat labile but overall stable.  - Continue benazepril  3.  HLD: LDL 65 05/15/20 - Continue atorvastatin  4. PAD: recently diagnosed moderate-high grade carotid artery disease and left subclavian steal syndrome on carotid dopplers 04/2020. Referred to vascular surgery and recommended for q45mo carotid dopplers  - Continue  aspirin and plavix - Continue statin - Continue routine monitoring per vascular surgery  5. GERD: on pantoprazole at home - Continue PPI  6. Acute on chronic anemia: Hgb 8.4 today, down from 9.5 prior to cath. Overall stable. No complaints of bleeding on DAPT - Continue to monitor routinely outpatient  Did the patient have an acute coronary syndrome (MI, NSTEMI, STEMI, etc) this admission?:  No                               Did the patient have a percutaneous coronary intervention (stent / angioplasty)?:  Yes.     Cath/PCI Registry Performance & Quality Measures: 1. Aspirin prescribed? - Yes 2. ADP Receptor Inhibitor (Plavix/Clopidogrel, Brilinta/Ticagrelor or Effient/Prasugrel) prescribed (includes medically managed patients)? - Yes 3. High Intensity Statin (Lipitor 40-80mg  or Crestor 20-40mg ) prescribed? - Yes 4. For EF <40%, was ACEI/ARB prescribed? - Yes 5. For EF <40%, Aldosterone Antagonist (Spironolactone or Eplerenone) prescribed? - Not Applicable (EF >/= 40%) 6. Cardiac Rehab Phase II ordered? - Yes       _____________  Discharge Vitals Blood pressure (!) 145/58, pulse 80, temperature 97.8 F (36.6 C), temperature source Oral, resp. rate 16, height 4\' 11"  (1.499 m), weight 43.2 kg, SpO2 97 %.  Filed Weights   06/04/20 0616 06/04/20 0904  Weight: 43.2 kg 43.2 kg    GEN: Sitting upright in bed in no acute distress.   Neck: No JVD Cardiac: RRR, no murmurs, rubs, or gallops. Right groin cath site without hematoma or ecchymosis, dressing C/D/I Respiratory: Clear to auscultation bilaterally, no wheezes/ rales/ rhonchi GI: NABS, Soft, nontender, non-distended  MS: No edema; No deformity. Neuro:  Nonfocal, moving all extremities spontaneously Psych: Normal affect     Labs & Radiologic Studies    CBC Recent Labs    06/02/20 0831 06/04/20 0248  WBC 6.8 6.8  HGB 9.5* 8.4*  HCT 29.9* 27.7*  MCV 87 93.3  PLT 372 303   Basic Metabolic Panel Recent Labs     06/02/20 0831 06/04/20 0248  NA 140 138  K 3.9 3.3*  CL 102 103  CO2 29 27  GLUCOSE 83 74  BUN 17 9  CREATININE 0.78 0.75  CALCIUM 9.5 8.6*   Liver Function Tests No results for input(s): AST, ALT, ALKPHOS, BILITOT, PROT, ALBUMIN in the last 72 hours. No results for input(s): LIPASE, AMYLASE in the last 72 hours. High Sensitivity Troponin:   No results for input(s): TROPONINIHS in the last 720 hours.  BNP Invalid input(s): POCBNP D-Dimer No results for input(s): DDIMER in the last 72 hours. Hemoglobin A1C No results for input(s): HGBA1C in the last 72 hours. Fasting Lipid Panel No results for input(s): CHOL, HDL, LDLCALC, TRIG, CHOLHDL, LDLDIRECT in the last 72 hours. Thyroid Function Tests No results for input(s): TSH, T4TOTAL, T3FREE, THYROIDAB in the last 72 hours.  Invalid input(s): FREET3 _____________  CARDIAC CATHETERIZATION  Result Date: 06/03/2020  Ost RCA to Prox RCA lesion is 99% stenosed.  Mid RCA lesion is 75% stenosed.  Post intervention, there is a 0% residual stenosis.  A drug-eluting stent was successfully placed using a STENT RESOLUTE ONYX 2.25X12.  Post intervention, there is a 15% residual stenosis.  A drug-eluting stent was successfully placed using a STENT RESOLUTE ONYX 3.5X12.  Successful but very difficult angioplasty, intravascular lithotripsy and drug-coated balloon angioplasty to the ostial right coronary artery as well as drug-eluting stent placement to the mid right coronary artery.  Very difficult procedure due to calcifications, ostial stenosis and tortuosity. Recommendations: Dual antiplatelet therapy for at least 12 months. Aggressive treatment of risk factors. Ostial RCA stent is high risk for in-stent restenosis.   CARDIAC CATHETERIZATION  Result Date: 05/21/2020  Ost RCA to Prox RCA lesion is 99% stenosed.  Mid RCA lesion is 75% stenosed.  Ost LM to Mid LM lesion is 40% stenosed.  Ost Cx to Prox Cx lesion is 60% stenosed.  The left  ventricular systolic function is normal.  LV end diastolic pressure is normal.  The left ventricular ejection fraction is 50-55% by visual estimate.  MARELLA VANDERPOL is a 75 y.o. female  [id number] LOCATION:  FACILITY: [facility] PHYSICIAN: [name], M.D. June 23, 1945 DATE OF PROCEDURE:  05/21/2020 DATE OF DISCHARGE: CARDIAC CATHETERIZATION History obtained from chart review.  Thin and frail appearing married Caucasian female with history of hypertension, hyperlipidemia and peripheral vascular disease.  I stented her iliac arteries in 2015 using "kissing stent technique.  She has also has known left subclavian artery stenosis with a 50 mm upper extremity gradient.  She saw Dr. Janne Napoleon with complaints of dyspnea on exertion and chest pain.  She was referred to me for diagnostic coronary angiography to define her anatomy. PROCEDURE DESCRIPTION: The patient was brought to the second floor [facility] cath lab in the postabsorptive state.  She was premedicated with IV Versed and fentanyl.  Her right wristwas prepped and shaved in usual sterile fashion. Xylocaine 1% was used for local anesthesia. A 6  French sheath was inserted into the right radial  artery using standard Seldinger technique. The patient received 2500 units  of heparin intravenously.  A 5 French TIG catheter and AR-1 catheters were used for selective coronary angiography and left ventriculography respectively.  Isovue dye was used for the entirety of the case.  Retrograde aortic and left ventricular and pullback pressures were recorded.  Radial cocktail was administered via the SideArm sheath. Patient received a total of 75 point units of heparin.  Using a 6 [place] XB LAD 3.0 cm guide catheter along with a DFR wire across the left main once I had a therapeutic ACT demonstrating a RFR of 0.97 suggesting that the left main was not physiologically significant.   Ms. Fehr has a 99% calcified ostial dominant RCA with a downgoing origin and  difficult to engage with a catheter making percutaneous intervention somewhat challenging.  In addition she has a 75% stenosis in the mid vessel.  She is a 40% distal left main stenosis that did not appear to be physiologically significant by RFR analysis and normal LV function.  I used 270 cc of contrast for the during the procedure.  She will need consideration for RCA intervention tomorrow.  I did load her with 600 mg of p.o. Plavix in anticipation for percutaneous intervention tomorrow. [name]. MD, [facility] 05/21/2020 12:01 PM   PERIPHERAL VASCULAR CATHETERIZATION  Result Date: 06/03/2020  Ost RCA to Prox RCA lesion is 99% stenosed.  Mid RCA lesion is 75% stenosed.  Post intervention, there is a 0% residual stenosis.  A drug-eluting stent was successfully placed using a STENT RESOLUTE ONYX 2.25X12.  Post intervention, there is a 15% residual stenosis.  A drug-eluting stent  was successfully placed using a STENT RESOLUTE ONYX 3.5X12.  Successful but very difficult angioplasty, intravascular lithotripsy and drug-coated balloon angioplasty to the ostial right coronary artery as well as drug-eluting stent placement to the mid right coronary artery.  Very difficult procedure due to calcifications, ostial stenosis and tortuosity. Recommendations: Dual antiplatelet therapy for at least 12 months. Aggressive treatment of risk factors. Ostial RCA stent is high risk for in-stent restenosis.   VAS US CAROTID  Result Date: 05/14/2020 Carotid Arterial Duplex Study Indications:                           Carotid artery disease and Patient denies                                        any cerebrovascular symptoms today. She                                        does get numbness and tingling in her                                        fingertips and feels this could be from                                        using the computer. These symptoms have                                        been happening for  the past year                                        intermittently. She also indicated she                                        was told she has a blockage in left arm                                        and has bilateral shoulder pains for many                                        years. Risk Factors:                          Hypertension, hyperlipidemia, past                                        history of smoking, PAD. Comparison Study:  Prior carotid duplex exam on 05/15/2019                                        showed highest velocities in right mid                                        ICA 222/37 cm/s and left proximal ICA                                        297/24 cm/s. Pre-Surgical Evaluation & Surgical     Stenosis proximal to left bifurcation Correlation                            (CCA stenosis). ICA is normal past the                                        stenosis. Anatomy on the left is within                                        normal limits.Left bifurcation is located                                        near the [place]. Performing Technologist: Carlos AmericanAlecia Mackin RVT, RDCS (AE), RDMS  Examination Guidelines: A complete evaluation includes B-mode imaging, spectral Doppler, color Doppler, and power Doppler as needed of all accessible portions of each vessel. Bilateral testing is considered an integral part of a complete examination. Limited examinations for reoccurring indications may be performed as noted.  Right Carotid Findings: +----------+--------+--------+--------+------------------+---------------------+           PSV cm/sEDV cm/sStenosisPlaque DescriptionComments              +----------+--------+--------+--------+------------------+---------------------+ CCA Prox  207     13      <50%                                            +----------+--------+--------+--------+------------------+---------------------+ CCA Mid   48      9                                                        +----------+--------+--------+--------+------------------+---------------------+ CCA Distal63      17                                                      +----------+--------+--------+--------+------------------+---------------------+ ICA Prox  365  69      60-79%                                          +----------+--------+--------+--------+------------------+---------------------+ ICA Mid   205     27                                post stenotic                                                             turbulence            +----------+--------+--------+--------+------------------+---------------------+ ICA Distal113     24                                                      +----------+--------+--------+--------+------------------+---------------------+ ECA       22                                        near occlusion;                                                           stumped flow          +----------+--------+--------+--------+------------------+---------------------+ +----------+--------+-------+--------+-------------------+           PSV cm/sEDV cmsDescribeArm Pressure (mmHG) +----------+--------+-------+--------+-------------------+ [id number]            [id number]                 +----------+--------+-------+--------+-------------------+ +---------+--------+---+--------+--+---------+ VertebralPSV cm/s145EDV cm/s18Antegrade +---------+--------+---+--------+--+---------+ Right brachial artery antecubital fossa 93 cm/s, triphasic flow. Left Carotid Findings: +----------+--------+--------+--------+----------------------+--------+           PSV cm/sEDV cm/sStenosisPlaque Description    Comments +----------+--------+--------+--------+----------------------+--------+ CCA Prox  125     24                                              +----------+--------+--------+--------+----------------------+--------+ CCA Mid   87      14                                             +----------+--------+--------+--------+----------------------+--------+ CCA Distal420     61      >50%    irregular and calcific         +----------+--------+--------+--------+----------------------+--------+ ICA Prox  370     51      40-59%                                 +----------+--------+--------+--------+----------------------+--------+  ICA Mid   132     18                                             +----------+--------+--------+--------+----------------------+--------+ ICA Distal91      28                                             +----------+--------+--------+--------+----------------------+--------+ ECA       374     25      >50%                                   +----------+--------+--------+--------+----------------------+--------+ +----------+--------+--------+------------------------+-------------------+           PSV cm/sEDV cm/sDescribe                Arm Pressure (mmHG) +----------+--------+--------+------------------------+-------------------+ [id number]             atypical monophasic flow106                 +----------+--------+--------+------------------------+-------------------+ +---------+--------+--+--------+--+---------------+ VertebralPSV cm/s78EDV cm/s78Bi- directional +---------+--------+--+--------+--+---------------+ Left brachial artery antecubital fossa 43 cm/s, monophasic flow.   Findings reported to Dr. Di Kindle email through [facility] at 2:15 pm . Summary: Right Carotid: Velocities in the right ICA are consistent with a 60-79%                stenosis, these have increased since prior exam. The ECA appears                near occlusion. Left Carotid: Velocities in the left ICA are consistent with a 40-59% stenosis.               ICA velocities may be underestimated due to more  proximal disease.               Hemodynamically significant plaque >50% visualized in the CCA. The               ECA appears >50% stenosed. Vertebrals:  Right vertebral artery demonstrates antegrade flow. Left vertebral              artery demonstrates bidirectional flow. Subclavians: Right subclavian artery was stenotic and disturbed. Left subclavian              has atypical, monophasic flow. Abnormal left vertebral flow and a              left 60 mmHg lower brachial pressure is indicative of left              subclavian steal syndrome. *See table(s) above for measurements and observations.  Vascular consult recommended. Patient is scheduled to see Dr. Tanja Port 05/15/2020 at 10:40 am to go over results. Electronically signed by [name] MD on 05/14/2020 at 4:32:30 PM.    Final    Disposition   Pt is being discharged home today in good condition.  Follow-up Plans & Appointments     Follow-up Information    Jodelle Red, MD Follow up on 06/22/2020.   Specialty: Cardiology Why: Please arrive 15 minutes early for your 10:40am post-hospital cardiology appointment Contact information: [address] [place] [address] [phone]  [facility] Follow up on 06/11/2020.   Specialty: Cardiology Why: Please arrive 15 minutes early for your 2:30pm echocardiogram (ultrasound of your heart) appointment.  Contact information: [address], [address] [place] [address] [phone]             Discharge Instructions    AMB Referral to Cardiac Rehabilitation - Phase II   Complete by: As directed    Diagnosis:  Coronary Stents Stable Angina     After initial evaluation and assessments completed: Virtual Based Care may be provided alone or in conjunction with Phase 2 Cardiac Rehab based on patient barriers.: Yes      Discharge Medications   Allergies as of 06/04/2020      Reactions   Demerol [meperidine] Nausea  And Vomiting   Metoprolol    asthma   Milk-related Compounds Other (See Comments)   REFLUX (CHEESE)   Oatmeal Hives   Other    [place], pecans--stomach aches   Pineapple Other (See Comments)   Burning in mouth   Salmon [fish Allergy] Other (See Comments)   And tuna and oysters (PATIENT TOLERATES)   Penicillins Rash      Medication List    TAKE these medications   albuterol 108 (90 Base) MCG/ACT inhaler Commonly known as: Ventolin HFA INHALE 2 PUFFS INTO THE LUNGS EVERY 6 (SIX) HOURS AS NEEDED FOR WHEEZING What changed:   how much to take  how to take this  when to take this  reasons to take this  additional instructions   aspirin EC 81 MG tablet Take 81 mg by mouth in the morning. Swallow whole.   atorvastatin 80 MG tablet Commonly known as: LIPITOR Take 1 tablet (80 mg total) by mouth daily.   benazepril 40 MG tablet Commonly known as: LOTENSIN TAKE 1 TABLET BY MOUTH ONCE A DAY What changed:   when to take this  additional instructions   cetirizine 10 MG tablet Commonly known as: ZYRTEC Take 10 mg by mouth daily as needed for allergies.   citalopram 40 MG tablet Commonly known as: CELEXA TAKE 1 TABLET BY MOUTH ONCE A DAY *NEED APPT FOR MORE REFILLS* What changed:   how much to take  how to take this  when to take this   clopidogrel 75 MG tablet Commonly known as: PLAVIX Take 1 tablet (75 mg total) by mouth daily with breakfast.   diphenhydrAMINE 25 MG tablet Commonly known as: BENADRYL Take 25-50 mg by mouth every 6 (six) hours as needed for itching (ECZEMA).   levothyroxine 75 MCG tablet Commonly known as: SYNTHROID Take 75 mcg by mouth daily before breakfast.   nitroGLYCERIN 0.4 MG SL tablet Commonly known as: Nitrostat Place 1 tablet (0.4 mg total) under the tongue every 5 (five) minutes as needed for chest pain.   pantoprazole 40 MG tablet Commonly known as: PROTONIX Take 1 tablet (40 mg total) by mouth daily.   triamcinolone 0.1  % Commonly known as: KENALOG Apply 1 application topically 2 (two) times daily. What changed:   when to take this  reasons to take this   Vitamin D3 50 MCG (2000 UT) Tabs Take 2,000 Units by mouth in the morning.          Outstanding Labs/Studies   Would check BMET outpatient to ensure Cr and electrolytes are stable post cath.  Consider CBC outpatient to ensure Hgb stable given recent acute on chronic anemia  Duration of Discharge Encounter   Greater  than 30 minutes including physician time.  Signed, Beatriz Stallion, PA-C 06/04/2020, 9:18 AM   Patient seen and examined and agree with Judy Pimple, PA-C as detailed above.  In brief, the patient is a 75 year old female with history of carotid artery stenosis, subclavian stenosis, HTN, HLD, asthma and recently diagnosed CAD with cath 05/21/20 demonstrating: Ost RCA topRCA lesion is 99% stenosed, mRCA lesion is 75% stenosed,Ost LM to Mid LM lesion is 40% stenosed,Ost Cx to Prox Cx lesion is 60% stenosed. 40% distal left main stenosis that did not appear to be physiologically significant by FFR analysis.Plan was for PCIintervention 3/25/22however Hbovernight went from 9.3>>>7.1 Case was ultimatelycancelled until Hb more stable. Patient returned on 06/03/20 for planned PCI to RCA given improvement in hemoglobin and no evidence of bleeding. Underwent successful intravascular lithotripsy and drug-coated balloon angioplasty to ostial RCA with placement of DES x2 to ostial RCA and mRCA lesions.   Doing well this morning. Denies chest pain or SOB. Cath site c/d/i. States she is ready to go home.  GEN: No acute distress.   Neck: No JVD Cardiac: RRR, 2/6 systolic murmur. No rubs, or gallops.  Respiratory: Clear to auscultation bilaterally. GI: Soft, nontender, non-distended  MS: No edema; No deformity. Right radial cath site c/d/i Neuro:  Nonfocal  Psych: Normal affect   Plan: -Continue ASA and plavix -Continue statin -Not  on BB due to history of listed allergy of "asthma." Can add as out-patient if needed -Resume home ACE -Plan for BMET/CBC post-discharge -Will arrange follow-up with Dr. Cristal Deer post-discharge  Laurance Flatten, MD

## 2020-06-04 NOTE — Discharge Instructions (Signed)
PLEASE REMEMBER TO BRING ALL OF YOUR MEDICATIONS TO EACH OF YOUR FOLLOW-UP OFFICE VISITS.  PLEASE ATTEND ALL SCHEDULED FOLLOW-UP APPOINTMENTS.   Activity: Increase activity slowly as tolerated. You may shower, but no soaking baths (or swimming) for 1 week. No driving for 24 hours. No lifting over 5 lbs for 1 week. No sexual activity for 1 week.   You May Return to Work: in 1 week (if applicable)  Wound Care: You may wash cath site gently with soap and water. Keep cath site clean and dry. If you notice pain, swelling, bleeding or pus at your cath site, please call [phone].    Femoral Site Care  This sheet gives you information about how to care for yourself after your procedure. Your health care provider may also give you more specific instructions. If you have problems or questions, contact your health care provider. What can I expect after the procedure? After the procedure, it is common to have:  Bruising that usually fades within 1-2 weeks.  Tenderness at the site. Follow these instructions at home: Wound care  Follow instructions from your health care provider about how to take care of your insertion site. Make sure you: ? Wash your hands with soap and water before you change your bandage (dressing). If soap and water are not available, use hand sanitizer. ? Change your dressing as told by your health care provider. ? Leave stitches (sutures), skin glue, or adhesive strips in place. These skin closures may need to stay in place for 2 weeks or longer. If adhesive strip edges start to loosen and curl up, you may trim the loose edges. Do not remove adhesive strips completely unless your health care provider tells you to do that.  Do not take baths, swim, or use a hot tub until your health care provider approves.  You may shower 24-48 hours after the procedure or as told by your health care provider. ? Gently wash the site with plain soap and water. ? Pat the area dry with a  clean towel. ? Do not rub the site. This may cause bleeding.  Do not apply powder or lotion to the site. Keep the site clean and dry.  Check your femoral site every day for signs of infection. Check for: ? Redness, swelling, or pain. ? Fluid or blood. ? Warmth. ? Pus or a bad smell. Activity  For the first 2-3 days after your procedure, or as long as directed: ? Avoid climbing stairs as much as possible. ? Do not squat.  Do not lift anything that is heavier than 10 lb (4.5 kg), or the limit that you are told, until your health care provider says that it is safe.  Rest as directed. ? Avoid sitting for a long time without moving. Get up to take short walks every 1-2 hours.  Do not drive for 24 hours if you were given a medicine to help you relax (sedative). General instructions  Take over-the-counter and prescription medicines only as told by your health care provider.  Keep all follow-up visits as told by your health care provider. This is important. Contact a health care provider if you have:  A fever or chills.  You have redness, swelling, or pain around your insertion site. Get help right away if:  The catheter insertion area swells very fast.  You pass out.  You suddenly start to sweat or your skin gets clammy.  The catheter insertion area is bleeding, and the bleeding does not stop  when you hold steady pressure on the area.  The area near or just beyond the catheter insertion site becomes pale, cool, tingly, or numb. These symptoms may represent a serious problem that is an emergency. Do not wait to see if the symptoms will go away. Get medical help right away. Call your local emergency services (911 in the [place] Do not drive yourself to the hospital. Summary  After the procedure, it is common to have bruising that usually fades within 1-2 weeks.  Check your femoral site every day for signs of infection.  Do not lift anything that is heavier than 10 lb (4.5 kg),  or the limit that you are told, until your health care provider says that it is safe. This information is not intended to replace advice given to you by your health care provider. Make sure you discuss any questions you have with your health care provider. Document Revised: 10/18/2019 Document Reviewed: 10/18/2019 Elsevier Patient Education  2021 Elsevier Inc.   Heart-Healthy Eating Plan Heart-healthy meal planning includes:  Eating less unhealthy fats.  Eating more healthy fats.  Making other changes in your diet. Talk with your doctor or a diet specialist (dietitian) to create an eating plan that is right for you. What is my plan? Your doctor may recommend an eating plan that includes:  Total fat: ______% or less of total calories a day.  Saturated fat: ______% or less of total calories a day.  Cholesterol: less than _________mg a day. What are tips for following this plan? Cooking Avoid frying your food. Try to bake, boil, grill, or broil it instead. You can also reduce fat by:  Removing the skin from poultry.  Removing all visible fats from meats.  Steaming vegetables in water or broth. Meal planning  At meals, divide your plate into four equal parts: ? Fill one-half of your plate with vegetables and green salads. ? Fill one-fourth of your plate with whole grains. ? Fill one-fourth of your plate with lean protein foods.  Eat 4-5 servings of vegetables per day. A serving of vegetables is: ? 1 cup of raw or cooked vegetables. ? 2 cups of raw leafy greens.  Eat 4-5 servings of fruit per day. A serving of fruit is: ? 1 medium whole fruit. ?  cup of dried fruit. ?  cup of fresh, frozen, or canned fruit. ?  cup of 100% fruit juice.  Eat more foods that have soluble fiber. These are apples, broccoli, carrots, beans, peas, and barley. Try to get 20-30 g of fiber per day.  Eat 4-5 servings of nuts, legumes, and seeds per week: ? 1 serving of dried beans or legumes  equals  cup after being cooked. ? 1 serving of nuts is  cup. ? 1 serving of seeds equals 1 tablespoon.   General information  Eat more home-cooked food. Eat less restaurant, buffet, and fast food.  Limit or avoid alcohol.  Limit foods that are high in starch and sugar.  Avoid fried foods.  Lose weight if you are overweight.  Keep track of how much salt (sodium) you eat. This is important if you have high blood pressure. Ask your doctor to tell you more about this.  Try to add vegetarian meals each week. Fats  Choose healthy fats. These include olive oil and canola oil, flaxseeds, walnuts, almonds, and seeds.  Eat more omega-3 fats. These include salmon, mackerel, sardines, tuna, flaxseed oil, and ground flaxseeds. Try to eat fish at least 2 times  each week.  Check food labels. Avoid foods with trans fats or high amounts of saturated fat.  Limit saturated fats. ? These are often found in animal products, such as meats, butter, and cream. ? These are also found in plant foods, such as palm oil, palm kernel oil, and coconut oil.  Avoid foods with partially hydrogenated oils in them. These have trans fats. Examples are stick margarine, some tub margarines, cookies, crackers, and other baked goods. What foods can I eat? Fruits All fresh, canned (in natural juice), or frozen fruits. Vegetables Fresh or frozen vegetables (raw, steamed, roasted, or grilled). Green salads. Grains Most grains. Choose whole wheat and whole grains most of the time. Rice and pasta, including brown rice and pastas made with whole wheat. Meats and other proteins Lean, well-trimmed beef, veal, pork, and lamb. Chicken and [place] without skin. All fish and shellfish. Wild duck, rabbit, pheasant, and venison. Egg whites or low-cholesterol egg substitutes. Dried beans, peas, lentils, and tofu. Seeds and most nuts. Dairy Low-fat or nonfat cheeses, including ricotta and mozzarella. Skim or 1% milk that is  liquid, powdered, or evaporated. Buttermilk that is made with low-fat milk. Nonfat or low-fat yogurt. Fats and oils Non-hydrogenated (trans-free) margarines. Vegetable oils, including soybean, sesame, sunflower, olive, peanut, safflower, corn, canola, and cottonseed. Salad dressings or mayonnaise made with a vegetable oil. Beverages Mineral water. Coffee and tea. Diet carbonated beverages. Sweets and desserts Sherbet, gelatin, and fruit ice. Small amounts of dark chocolate. Limit all sweets and desserts. Seasonings and condiments All seasonings and condiments. The items listed above may not be a complete list of foods and drinks you can eat. Contact a dietitian for more options. What foods should I avoid? Fruits Canned fruit in heavy syrup. Fruit in cream or butter sauce. Fried fruit. Limit coconut. Vegetables Vegetables cooked in cheese, cream, or butter sauce. Fried vegetables. Grains Breads that are made with saturated or trans fats, oils, or whole milk. Croissants. Sweet rolls. Donuts. High-fat crackers, such as cheese crackers. Meats and other proteins Fatty meats, such as hot dogs, ribs, sausage, bacon, rib-eye roast or steak. High-fat deli meats, such as salami and bologna. Caviar. Domestic duck and goose. Organ meats, such as liver. Dairy Cream, sour cream, cream cheese, and creamed cottage cheese. Whole-milk cheeses. Whole or 2% milk that is liquid, evaporated, or condensed. Whole buttermilk. Cream sauce or high-fat cheese sauce. Yogurt that is made from whole milk. Fats and oils Meat fat, or shortening. Cocoa butter, hydrogenated oils, palm oil, coconut oil, palm kernel oil. Solid fats and shortenings, including bacon fat, salt pork, lard, and butter. Nondairy cream substitutes. Salad dressings with cheese or sour cream. Beverages Regular sodas and juice drinks with added sugar. Sweets and desserts Frosting. Pudding. Cookies. Cakes. Pies. Milk chocolate or white chocolate.  Buttered syrups. Full-fat ice cream or ice cream drinks. The items listed above may not be a complete list of foods and drinks to avoid. Contact a dietitian for more information. Summary  Heart-healthy meal planning includes eating less unhealthy fats, eating more healthy fats, and making other changes in your diet.  Eat a balanced diet. This includes fruits and vegetables, low-fat or nonfat dairy, lean protein, nuts and legumes, whole grains, and heart-healthy oils and fats. This information is not intended to replace advice given to you by your health care provider. Make sure you discuss any questions you have with your health care provider. Document Revised: 04/20/2017 Document Reviewed: 03/24/2017 Elsevier Patient Education  2021 [employer].

## 2020-06-04 NOTE — Progress Notes (Addendum)
CARDIAC REHAB PHASE I   PRE:  Rate/Rhythm: 84 SR    BP: sitting 145/58    SaO2: 94 RA  MODE:  Ambulation: 430 ft   POST:  Rate/Rhythm: 108 ST    BP: sitting 156/52     SaO2: 94 RA  Tolerated well except some SOB with distance. HR up to 108 ST. To recliner, pt talkative. Discussed restrictions, Plavix, diet, NTG, and CRPII. Pt sts she struggles to exercise due to exercise induced asthma. Gave modified walking guidelines. Encouraged CRPII. Will refer to [facility] CRPII.  [id number] Alexis [name] CES, ACSM 06/04/2020 9:22 AM

## 2020-06-11 ENCOUNTER — Encounter (HOSPITAL_COMMUNITY): Payer: Self-pay

## 2020-06-11 ENCOUNTER — Ambulatory Visit (HOSPITAL_COMMUNITY): Payer: Medicare Other | Attending: Cardiology

## 2020-06-11 ENCOUNTER — Encounter: Payer: Self-pay | Admitting: Cardiology

## 2020-06-11 NOTE — Progress Notes (Unsigned)
Patient ID: Alexis [name], female   DOB: June 05, 1945, 75 y.o.   MRN: [id number]  Verified appointment "no show" status with Leanne at 2:52pm.

## 2020-06-22 ENCOUNTER — Ambulatory Visit: Payer: Medicare Other | Admitting: Cardiology

## 2020-06-22 ENCOUNTER — Encounter (HOSPITAL_COMMUNITY): Payer: Self-pay

## 2020-06-23 ENCOUNTER — Other Ambulatory Visit (HOSPITAL_COMMUNITY): Payer: Medicare Other

## 2020-06-24 ENCOUNTER — Telehealth: Payer: Self-pay | Admitting: Family Medicine

## 2020-06-24 DIAGNOSIS — F419 Anxiety disorder, unspecified: Secondary | ICD-10-CM

## 2020-06-24 MED ORDER — CITALOPRAM HYDROBROMIDE 40 MG PO TABS: ORAL_TABLET | ORAL | 0 refills | Status: DC

## 2020-06-24 NOTE — Telephone Encounter (Signed)
Patient called to request refill of

## 2020-06-24 NOTE — Telephone Encounter (Signed)
Prescription sent to pharmacy.   Appointment scheduled 06/25/2020.

## 2020-06-24 NOTE — Telephone Encounter (Signed)
Patient called to request refill of citalopram (CELEXA) 40 MG tablet [[id number]]   Pharmacy:   [facility] [place] [address]  [address], [place] [address]  Phone:  [phone] Fax:  [phone]   Patient took last pill 06/23/2020.   Office visit scheduled for 4/27.  Please advise at [phone].

## 2020-06-25 ENCOUNTER — Ambulatory Visit (INDEPENDENT_AMBULATORY_CARE_PROVIDER_SITE_OTHER): Payer: Medicare Other | Admitting: Nurse Practitioner

## 2020-06-25 ENCOUNTER — Other Ambulatory Visit: Payer: Self-pay

## 2020-06-25 ENCOUNTER — Encounter: Payer: Self-pay | Admitting: Nurse Practitioner

## 2020-06-25 VITALS — BP 158/60 | HR 84 | Temp 97.2°F | Ht 59.0 in | Wt 99.0 lb

## 2020-06-25 DIAGNOSIS — I6523 Occlusion and stenosis of bilateral carotid arteries: Secondary | ICD-10-CM

## 2020-06-25 DIAGNOSIS — E039 Hypothyroidism, unspecified: Secondary | ICD-10-CM | POA: Diagnosis not present

## 2020-06-25 DIAGNOSIS — F419 Anxiety disorder, unspecified: Secondary | ICD-10-CM | POA: Diagnosis not present

## 2020-06-25 DIAGNOSIS — I10 Essential (primary) hypertension: Secondary | ICD-10-CM | POA: Diagnosis not present

## 2020-06-25 DIAGNOSIS — K219 Gastro-esophageal reflux disease without esophagitis: Secondary | ICD-10-CM | POA: Diagnosis not present

## 2020-06-25 DIAGNOSIS — J4520 Mild intermittent asthma, uncomplicated: Secondary | ICD-10-CM
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
## 2020-06-26 LAB — TSH: TSH: 3.87 mIU/L (ref 0.40–4.50)

## 2020-06-26 MED ORDER — CITALOPRAM HYDROBROMIDE 40 MG PO TABS: 40.0000 mg | ORAL_TABLET | Freq: Every day | ORAL | 1 refills | Status: DC

## 2020-06-26 MED ORDER — ALBUTEROL SULFATE HFA 108 (90 BASE) MCG/ACT IN AERS: 2.0000 | INHALATION_SPRAY | Freq: Four times a day (QID) | RESPIRATORY_TRACT | 2 refills | Status: DC | PRN

## 2020-06-26 MED ORDER — BENAZEPRIL HCL 40 MG PO TABS: 40.0000 mg | ORAL_TABLET | Freq: Every morning | ORAL | 1 refills | Status: DC

## 2020-06-26 MED ORDER — LEVOTHYROXINE SODIUM 75 MCG PO TABS: 75.0000 ug | ORAL_TABLET | Freq: Every day | ORAL | 1 refills | Status: DC

## 2020-06-26 MED ORDER — PANTOPRAZOLE SODIUM 40 MG PO TBEC: 40.0000 mg | DELAYED_RELEASE_TABLET | Freq: Every day | ORAL | 1 refills | Status: DC

## 2020-06-26 NOTE — Assessment & Plan Note (Signed)
Chronic.  Recently had stents placed.  Follows closely with cardiology-continue collaboration.  Continue dual antiplatelet therapy with atorvastatin.  Follow-up 6 months.

## 2020-06-26 NOTE — Assessment & Plan Note (Addendum)
Chronic.  Stable on pantoprazole 40 mg.  Continue this medication for now, refill given.  Follow-up 6 months.

## 2020-06-26 NOTE — Assessment & Plan Note (Addendum)
Chronic.  Blood pressure elevated above goal of less than 140/90 in clinic today.  Patient reports she has taken benazepril today, however does not check blood pressure at home.  Encouraged patient to obtain a blood pressure cuff at home and monitor blood pressure at home periodically.  If above goal of 140/90, will need to add another medication.  Patient instructed to message [place] with blood pressure readings in 1 week.  Follow-up in 3 months or sooner if blood pressure reading elevated.

## 2020-06-26 NOTE — Assessment & Plan Note (Signed)
Chronic.  No symptoms of hypo or hyperthyroidism today.  Will check TSH today.  We will give refill of levothyroxine after blood work returns if TSH is normal.  Follow-up in 6 months.

## 2020-06-26 NOTE — Assessment & Plan Note (Signed)
Chronic.  Reports symptoms are very well controlled on Celexa 40 mg.  We will continue this medication regimen for now, refills given.  Follow-up in 6 months.

## 2020-06-26 NOTE — Assessment & Plan Note (Signed)
Chronic.  Stable with as needed albuterol.  Will refill today; encourage patient to notify clinic if using more than 2-4 times per week.  Follow-up in 6 months.

## 2020-06-30 ENCOUNTER — Telehealth (HOSPITAL_COMMUNITY): Payer: Self-pay | Admitting: Cardiology

## 2020-06-30 NOTE — Telephone Encounter (Signed)
Patient cancelled echocardiogram thru My Chart and did not reschedule. Order will be removed from the ECHO WQ and if patient wants to reschedule we will reinstate the order. Thank you.

## 2020-07-06 ENCOUNTER — Ambulatory Visit: Payer: Medicare Other | Admitting: Cardiology

## 2020-07-06 ENCOUNTER — Other Ambulatory Visit: Payer: Self-pay

## 2020-07-06 ENCOUNTER — Encounter: Payer: Self-pay | Admitting: Cardiology

## 2020-07-06 VITALS — BP 146/58 | Ht 59.0 in | Wt 98.6 lb

## 2020-07-06 DIAGNOSIS — Z7189 Other specified counseling: Secondary | ICD-10-CM | POA: Diagnosis not present

## 2020-07-06 DIAGNOSIS — I771 Stricture of artery: Secondary | ICD-10-CM

## 2020-07-06 DIAGNOSIS — I10 Essential (primary) hypertension: Secondary | ICD-10-CM

## 2020-07-06 DIAGNOSIS — I739 Peripheral vascular disease, unspecified: Secondary | ICD-10-CM

## 2020-07-06 DIAGNOSIS — I6523 Occlusion and stenosis of bilateral carotid arteries: Secondary | ICD-10-CM | POA: Diagnosis not present

## 2020-07-06 DIAGNOSIS — E785 Hyperlipidemia, unspecified: Secondary | ICD-10-CM | POA: Diagnosis not present

## 2020-07-06 DIAGNOSIS — D649 Anemia, unspecified: Secondary | ICD-10-CM

## 2020-07-06 DIAGNOSIS — I2510 Atherosclerotic heart disease of native coronary artery without angina pectoris: Secondary | ICD-10-CM

## 2020-07-06 DIAGNOSIS — E876 Hypokalemia: Secondary | ICD-10-CM | POA: Diagnosis not present

## 2020-07-06 NOTE — Progress Notes (Signed)
Cardiology Office Note:    Date:  07/06/2020   ID:  ADLYN FIFE, DOB 12-06-1945, MRN [id number]  PCP:  Eulogio Bear, NP  Cardiologist:  Buford Dresser, MD  Referring MD: Alycia Rossetti, MD   CC: follow up  History of Present Illness:    Alexis [name] is a 75 y.o. female with a hx of CAD with complex PCI of RCA 05/2020, PAD (carotid artery disease, subclavian artery stenosis, claudication), hypertension, asthma, hyperlipidemia who is seen for follow up today. I initially met her 05/09/19 as a new consult at the request of Pierce, Modena Nunnery, MD for the evaluation and management of carotid bruit. She was last seen by Dr. Gwenlyn Found in 2015.  Today: Overall she is feeling well. Lately, she reports beginning to get more of her energy back and she can tell her breathing has improved significantly. She does not normally check her blood pressure at home. At this visit, she felt a little dizzy when she laid back on the exam table at this visit, room spun consistent with her prior vertigo. She confirms that she bruises easily, and currently has multiple bruises on both arms. These are not bothersome to her. She denies any hematuria or blood in her stool. Also denies any chest pain, shortness of breath, or palpitations. No pre-syncope or syncope to note. Also has no lower extremity edema, orthopnea or PND.  Past Medical History:  Diagnosis Date  . Anxiety   . Claudication ([place])    stent in bil iliac 11/25/10  . Coronary artery disease   . GERD (gastroesophageal reflux disease)   . HTN (hypertension)   . Hyperlipemia   . Hypothyroidism   . Murmur, cardiac    echo 10/12/10- EF >55%, mod sclerotic aortic valve  . PVD (peripheral vascular disease) ([place])    lower ext and upper ext-L subclavian stenosis by ultrasound, nl nuclear test- 10/27/10  . Tobacco abuse   . Vitamin D deficiency     Past Surgical History:  Procedure Laterality Date  . CORONARY STENT INTERVENTION N/A 06/03/2020    Procedure: CORONARY STENT INTERVENTION;  Surgeon: Wellington Hampshire, MD;  Location: [facility];  Service: Cardiovascular;  Laterality: N/A;  . INTRAVASCULAR LITHOTRIPSY  06/03/2020   Procedure: INTRAVASCULAR LITHOTRIPSY;  Surgeon: Wellington Hampshire, MD;  Location: [facility];  Service: Cardiovascular;;  . INTRAVASCULAR PRESSURE WIRE/FFR STUDY N/A 05/21/2020   Procedure: INTRAVASCULAR PRESSURE WIRE/FFR STUDY;  Surgeon: Lorretta Harp, MD;  Location: [facility];  Service: Cardiovascular;  Laterality: N/A;  . KNEE SURGERY    . LEFT HEART CATH AND CORONARY ANGIOGRAPHY N/A 05/21/2020   Procedure: LEFT HEART CATH AND CORONARY ANGIOGRAPHY;  Surgeon: Lorretta Harp, MD;  Location: [facility];  Service: Cardiovascular;  Laterality: N/A;  . peripheral angio  11/25/10   right iliac-6x27 Express balloon expandable stent; Left iliac 6x37  . TONSILLECTOMY      Current Medications: Current Outpatient Medications on File Prior to Visit  Medication Sig  . albuterol (VENTOLIN HFA) 108 (90 Base) MCG/ACT inhaler Inhale 2 puffs into the lungs every 6 (six) hours as needed for wheezing or shortness of breath.  Marland Kitchen aspirin EC 81 MG tablet Take 81 mg by mouth in the morning. Swallow whole.  Marland Kitchen atorvastatin (LIPITOR) 80 MG tablet Take 1 tablet (80 mg total) by mouth daily.  . benazepril (LOTENSIN) 40 MG tablet Take 1 tablet (40 mg total) by mouth in the morning.  Marland Kitchen  Cholecalciferol (VITAMIN D3) 50 MCG (2000 UT) TABS Take 2,000 Units by mouth in the morning.  . citalopram (CELEXA) 40 MG tablet Take 1 tablet (40 mg total) by mouth daily. TAKE 1 TABLET BY MOUTH ONCE A DAY  . clopidogrel (PLAVIX) 75 MG tablet Take 1 tablet (75 mg total) by mouth daily with breakfast.  . diphenhydrAMINE (BENADRYL) 25 MG tablet Take 25-50 mg by mouth every 6 (six) hours as needed for itching (ECZEMA).  Marland Kitchen levocetirizine (XYZAL) 5 MG tablet Take 5 mg by mouth every evening.  Marland Kitchen levothyroxine (SYNTHROID) 75 MCG  tablet Take 1 tablet (75 mcg total) by mouth daily before breakfast.  . nitroGLYCERIN (NITROSTAT) 0.4 MG SL tablet Place 1 tablet (0.4 mg total) under the tongue every 5 (five) minutes as needed for chest pain.  . pantoprazole (PROTONIX) 40 MG tablet Take 1 tablet (40 mg total) by mouth daily.  Marland Kitchen triamcinolone cream (KENALOG) 0.1 % Apply 1 application topically 2 (two) times daily. (Patient taking differently: Apply 1 application topically 2 (two) times daily as needed (ECZEMA).)   No current facility-administered medications on file prior to visit.     Allergies:   Demerol [meperidine], Metoprolol, Milk-related compounds, Oatmeal, Other, Pineapple, Salmon [fish allergy], and Penicillins   Social History   Tobacco Use  . Smoking status: Former Smoker    Quit date: 02/28/2002    Years since quitting: 18.3  . Smokeless tobacco: Never Used  Vaping Use  . Vaping Use: Never used  Substance Use Topics  . Alcohol use: No  . Drug use: No  quit smoking ~2004-2005, quit cold [place]. Was 2 ppd smoker for >40 years.  Family History: family history includes Allergic rhinitis in her father, maternal grandmother, and paternal grandmother; Asthma in her maternal grandfather; Urticaria in her maternal grandmother. There is no history of Angioedema, Eczema, or Immunodeficiency.  father had MI, had angioplasty, no damage to his heart. Mother without heart issues. Brother is in his late 68s, had massive MI (silent) and developed heart failure. Had 5V CABG, also found to have AAA. Multiple distant family members on her mother's side with heart issues.   ROS:   Please see the history of present illness.   (+) Dizziness (+) Bruises easily Additional pertinent ROS otherwise unremarkable.  EKGs/Labs/Other Studies Reviewed:    The following studies were reviewed today:  Intravascular Lithotripsy/Coronary Stent Intervention 06/03/2020: Conclusion:  Ost RCA to Prox RCA lesion is 99% stenosed.  Mid RCA  lesion is 75% stenosed.  Post intervention, there is a 0% residual stenosis.  A drug-eluting stent was successfully placed using a [place] 8.88K80.  Post intervention, there is a 15% residual stenosis.  A drug-eluting stent was successfully placed using a STENT RESOLUTE ONYX 3.5X12.   Successful but very difficult angioplasty, intravascular lithotripsy and drug-coated balloon angioplasty to the ostial right coronary artery as well as drug-eluting stent placement to the mid right coronary artery.  Very difficult procedure due to calcifications, ostial stenosis and tortuosity.  Recommendations: Dual antiplatelet therapy for at least 12 months. Aggressive treatment of risk factors. Ostial RCA stent is high risk for in-stent restenosis.   Left Heart Cath and Coronary Angiography 05/21/2020: Conclusion:  Ost RCA to Prox RCA lesion is 99% stenosed.  Mid RCA lesion is 75% stenosed.  Ost LM to Mid LM lesion is 40% stenosed.  Ost Cx to Prox Cx lesion is 60% stenosed.  The left ventricular systolic function is normal.  LV end diastolic pressure is  normal.  The left ventricular ejection fraction is 50-55% by visual estimate.  IMPRESSION: Ms. Flegal has a 99% calcified ostial dominant RCA with a downgoing origin and difficult to engage with a catheter making percutaneous intervention somewhat challenging.  In addition she has a 75% stenosis in the mid vessel.  She is a 40% distal left main stenosis that did not appear to be physiologically significant by RFR analysis and normal LV function.  I used 270 cc of contrast for the during the procedure.  She will need consideration for RCA intervention tomorrow.  I did load her with 600 mg of p.o. Plavix in anticipation for percutaneous intervention tomorrow.   Carotids 05/14/20  Summary:  Right Carotid: Velocities in the right ICA are consistent with a 60-79% stenosis, these have increased since prior exam. The ECA appears         near occlusion.   Left Carotid: Velocities in the left ICA are consistent with a 40-59% stenosis. ICA velocities may be underestimated due to more proximal disease.        Hemodynamically significant plaque >50% visualized in the  CCA. The ECA appears >50% stenosed.   Vertebrals: Right vertebral artery demonstrates antegrade flow. Left  vertebral artery demonstrates bidirectional flow.  Subclavians: Right subclavian artery was stenotic and disturbed. Left  subclavian has atypical, monophasic flow. Abnormal left vertebral flow  and a left 60 mmHg lower brachial pressure is indicative of left subclavian steal syndrome.   Peripheral vascular angiography and intervention 11/04/2010 and 11/05/2010 ANGIOGRAPHIC RESULT: 1. Abdominal aorta;     a.     Renal arteries - 40% left renal artery stenosis. 2. Left lower extremity;     a.     60% segmental ostial size proximal left common iliac artery      stenosis.     b.     95-99% stenosis of the mid left common iliac artery.     c.     50% stenosis of the left common femoral artery.     d.     50% segmental mid left SFA stenosis with two-vessel runoff. 3. The posterior tibial was occluded with both sides. 4. Right lower extremity;     a.     95% proximal right common iliac artery stenosis.     b.     Two-vessel runoff an occluded posterior tibial.  IMPRESSION:  Ms. Polendo has high-grade bilateral iliac disease responsible for decreased ABIs and claudication.  I believe she is a candidate for PTA and stenting.  DESCRIPTION OF PROCEDURE:  The patient brought to the second floor [place] [facility] in the post absorptive state.  She was premedicated p.o. Valium, IV Versed, and IV fentanyl.  Her right left groins were prepped and shaved in usual sterile fashion.  1% Xylocaine was used for local anesthesia.  6-French bright tip long sheath were inserted into left femoral arteries using standard Seldinger technique under  fluoroscopic control.  The patient received a total 55 liters of heparin intravenously with an ACT of 226.  Total 71 mL of contrast was administered during the case.  Pre-dilatation was performed over _______ wires bilaterally with a 4 x 2 balloon.  Stenting was performed on the right with a 6 x 27 Express stainless steel balloon expandable stent and on the left with a 6 x 37 nominal pressures.  Postdilatation performed using "kissing balloon technique with a 6 x 2 balloon at the origin resulting reduction of 90% bilateral iliac  stenosis to 0% residual with excellent result.  The patient tolerated the procedure well.  ACT was measured.  The sheath will be removed and pressure will be held regarding the sheath hemostasis.  The patient received 300 mg of clopidogrel.  She will be hydrated overnight, discharged on the morning. She has followup Dopplers and ABIs.  I will see back in followup.  EKG:  EKG is personally reviewed today.  05/15/2020: sinus rhythm, iRBBB at 78 bpm 07/06/2020: sinus rhythm, iRBBB at 75 bpm  Recent Labs: 11/26/2019: ALT 10 06/04/2020: BUN 9; Creatinine, Ser 0.75; Hemoglobin 8.4; Platelets 303; Potassium 3.3; Sodium 138 06/25/2020: TSH 3.87  Recent Lipid Panel    Component Value Date/Time   CHOL 135 05/15/2020 1239   TRIG 86 05/15/2020 1239   HDL 53 05/15/2020 1239   CHOLHDL 2.5 05/15/2020 1239   CHOLHDL 2.8 11/26/2019 1153   VLDL 23 12/03/2015 1212   LDLCALC 65 05/15/2020 1239   LDLCALC 79 11/26/2019 1153    Physical Exam:    VS:  BP (!) 146/58   Ht $R'4\' 11"'Pf$  (1.499 m)   Wt 98 lb 9.6 oz (44.7 kg)   BMI 19.91 kg/m     Wt Readings from Last 3 Encounters:  07/06/20 98 lb 9.6 oz (44.7 kg)  06/25/20 99 lb (44.9 kg)  06/04/20 95 lb 3.2 oz (43.2 kg)    GEN: Well nourished, well developed in no acute distress HEENT: Normal, moist mucous membranes NECK: No JVD CARDIAC: regular rhythm, normal S1 and S2, no rubs or gallops. 1/6 systolic murmur. VASCULAR: Radial  and DP pulses trace to 1+ bilaterally. bilateral carotid bruits RESPIRATORY:  Clear to auscultation without rales, wheezing or rhonchi  ABDOMEN: Soft, non-tender, non-distended MUSCULOSKELETAL:  Ambulates independently SKIN: Warm and dry, no edema, multiple bruises on bilateral arms. NEUROLOGIC:  Alert and oriented x 3. No focal neuro deficits noted. PSYCHIATRIC:  Normal affect   ASSESSMENT:    1. Coronary artery disease involving native coronary artery of native heart without angina pectoris   2. Anemia, unspecified type   3. Hypokalemia   4. Bilateral carotid artery stenosis   5. PAD (peripheral artery disease) (H[facility])   6. Subclavian artery stenosis, left ([facility])   7. Essential hypertension   8. Hyperlipidemia, unspecified hyperlipidemia type   9. Cardiac risk counseling   10. Counseling on health promotion and disease prevention    PLAN:    Obstructive CAD, s/p complex PCI to RCA 05/2020 -feeling much improved post PCI -does have some bruising, but not bothersome -continue DAPT at least 12 months, given complex ostial RCA stenosis, will need to discuss options post 12 months based on available literature  PAD: carotid disease, subclavian stenosis, history of claudication s/p bilateral iliac stenting 2012 -denies any neurologic symptoms, but has progressive carotid stenosis based on imaging studies. Following with vascular surgery -bilateral carotid bruits -continue aspirin, statin -counseled on red flag warning signs that need immediate medical attention -discussed risk factor management  Hypertension: goal <130/80, above goal systolic today but diastolic borderline low -has subclavian disease, confounds BP measurements -continue benazepril 40 mg -will not make changes today  Hyperlipidemia: LDL goal <70 given PAD -last LDL <70 -continue antiplatelet, atorvastatin 80 mg daily  Anemia, hypokalemia: noted during recent hospitalization. Will recheck labs today  Cardiac risk  counseling and prevention recommendations: -recommend heart healthy/Mediterranean diet, with whole grains, fruits, vegetable, fish, lean meats, nuts, and olive oil. Limit salt. -recommend moderate walking, 3-5 times/week for 30-50 minutes each session.  Aim for at least 150 minutes.week. Goal should be pace of 3 miles/hours, or walking 1.5 miles in 30 minutes -recommend avoidance of tobacco products. Avoid excess alcohol.  Plan for follow up: 6 months or sooner as needed.  Buford Dresser, MD, PhD, [facility]    Medication Adjustments/Labs and Tests Ordered: Current medicines are reviewed at length with the patient today.  Concerns regarding medicines are outlined above.  Orders Placed This Encounter  Procedures  . Basic Metabolic Panel (BMET)  . CBC  . EKG 12-Lead   No orders of the defined types were placed in this encounter.   Patient Instructions   Follow-Up: At [facility], you and your health needs are our priority.  As part of our continuing mission to provide you with exceptional heart care, we have created designated Provider Care Teams.  These Care Teams include your primary Cardiologist (physician) and Advanced Practice Providers (APPs -  Physician Assistants and Nurse Practitioners) who all work together to provide you with the care you need, when you need it.  We recommend signing up for the patient portal called "MyChart".  Sign up information is provided on this After Visit Summary.  MyChart is used to connect with patients for Virtual Visits (Telemedicine).  Patients are able to view lab/test results, encounter notes, upcoming appointments, etc.  Non-urgent messages can be sent to your provider as well.   To learn more about what you can do with MyChart, go to [email or web address].    Your next appointment:   6 month(s)  The format for your next appointment:   In Person  Provider:   Buford Dresser, MD       [facility]  Stumpf,acting as a scribe for Buford Dresser, MD.,have documented all relevant documentation on the behalf of Buford Dresser, MD,as directed by  Buford Dresser, MD while in the presence of Buford Dresser, MD.  I, Buford Dresser, MD, have reviewed all documentation for this visit. The documentation on 07/06/20 for the exam, diagnosis, procedures, and orders are all accurate and complete.  Signed, Buford Dresser, MD PhD 07/06/2020   [facility]

## 2020-07-06 NOTE — Patient Instructions (Signed)

## 2020-07-07 ENCOUNTER — Other Ambulatory Visit (HOSPITAL_COMMUNITY): Payer: Medicare Other

## 2020-07-07 ENCOUNTER — Encounter (HOSPITAL_COMMUNITY): Payer: Self-pay

## 2020-07-07 LAB — BASIC METABOLIC PANEL
BUN/Creatinine Ratio: 24 (ref 12–28)
BUN: 16 mg/dL (ref 8–27)
CO2: 28 mmol/L (ref 20–29)
Calcium: 9.1 mg/dL (ref 8.7–10.3)
Chloride: 100 mmol/L (ref 96–106)
Creatinine, Ser: 0.66 mg/dL (ref 0.57–1.00)
Glucose: 79 mg/dL (ref 65–99)
Potassium: 4 mmol/L (ref 3.5–5.2)
Sodium: 142 mmol/L (ref 134–144)
eGFR: 91 mL/min/{1.73_m2} (ref >59)

## 2020-07-07 LAB — CBC
Hematocrit: 36 % (ref 34.0–46.6)
Hemoglobin: 11.4 g/dL (ref 11.1–15.9)
MCH: 29.5 pg (ref 26.6–33.0)
MCHC: 31.7 g/dL (ref 31.5–35.7)
MCV: 93 fL (ref 79–97)
Platelets: 279 10*3/uL (ref 150–450)
RBC: 3.87 x10E6/uL (ref 3.77–5.28)
RDW: 18 % — ABNORMAL HIGH (ref 11.7–15.4)
WBC: 7 10*3/uL (ref 3.4–10.8)

## 2020-10-29 ENCOUNTER — Other Ambulatory Visit: Payer: Self-pay | Admitting: *Deleted

## 2020-12-08 ENCOUNTER — Ambulatory Visit: Payer: Medicare Other | Admitting: Vascular Surgery

## 2020-12-08 ENCOUNTER — Other Ambulatory Visit: Payer: Self-pay

## 2020-12-08 ENCOUNTER — Ambulatory Visit (HOSPITAL_COMMUNITY)
Admission: RE | Admit: 2020-12-08 | Discharge: 2020-12-08 | Disposition: A | Discharge status: Home / Self Care | Payer: Medicare Other | Source: Ambulatory Visit | Attending: Vascular Surgery | Admitting: Vascular Surgery

## 2020-12-08 ENCOUNTER — Encounter: Payer: Self-pay | Admitting: Vascular Surgery

## 2020-12-08 VITALS — BP 148/75 | HR 71 | Temp 98.2°F | Resp 14 | Ht 59.0 in | Wt 93.0 lb

## 2020-12-08 DIAGNOSIS — I6523 Occlusion and stenosis of bilateral carotid arteries: Secondary | ICD-10-CM

## 2020-12-08 NOTE — Progress Notes (Signed)
Patient name: Alexis [name] MRN: [id number] DOB: 19-Nov-1945 Sex: female  REASON FOR CONSULT: 55-month follow-up for surveillance of carotid artery disease  HPI: Alexis [name] is a 75 y.o. female, history of hypertension, hyperlipidemia, PAD with claudication, coronary artery disease that presents for 26-month follow-up of her carotid artery disease.  She reports no history of TIAs or strokes.  No focal neurologic symptoms or vision loss since last follow-up.    During her last visit 6 months ago her ultrasound on 05/14/2020 showed a 60 to 79% stenosis in the right ICA and a 40 to 59% stenosis in the left ICA.  The left vertebral artery had bidirectional flow with a monophasic flow in the subclavian.  She states she has known about her left arm blockage for years.  She has had no significant left arm symptoms.    Past Medical History:  Diagnosis Date   Anxiety    Claudication (HCC)    stent in bil iliac 11/25/10   Coronary artery disease    GERD (gastroesophageal reflux disease)    HTN (hypertension)    Hyperlipemia    Hypothyroidism    Murmur, cardiac    echo 10/12/10- EF >55%, mod sclerotic aortic valve   PVD (peripheral vascular disease) (HCC)    lower ext and upper ext-L subclavian stenosis by ultrasound, nl nuclear test- 10/27/10   Tobacco abuse    Vitamin D deficiency     Past Surgical History:  Procedure Laterality Date   CORONARY STENT INTERVENTION N/A 06/03/2020   Procedure: CORONARY STENT INTERVENTION;  Surgeon: Iran Ouch, MD;  Location: [facility];  Service: Cardiovascular;  Laterality: N/A;   INTRAVASCULAR LITHOTRIPSY  06/03/2020   Procedure: INTRAVASCULAR LITHOTRIPSY;  Surgeon: Iran Ouch, MD;  Location: [facility];  Service: Cardiovascular;;   INTRAVASCULAR PRESSURE WIRE/FFR STUDY N/A 05/21/2020   Procedure: INTRAVASCULAR PRESSURE WIRE/FFR STUDY;  Surgeon: Runell Gess, MD;  Location: [facility];  Service: Cardiovascular;   Laterality: N/A;   KNEE SURGERY     LEFT HEART CATH AND CORONARY ANGIOGRAPHY N/A 05/21/2020   Procedure: LEFT HEART CATH AND CORONARY ANGIOGRAPHY;  Surgeon: Runell Gess, MD;  Location: [facility];  Service: Cardiovascular;  Laterality: N/A;   peripheral angio  11/25/10   right iliac-6x27 Express balloon expandable stent; Left iliac 6x37   TONSILLECTOMY      Family History  Problem Relation Age of Onset   Allergic rhinitis Father    Allergic rhinitis Maternal Grandmother    Urticaria Maternal Grandmother    Asthma Maternal Grandfather    Allergic rhinitis Paternal Grandmother    Angioedema Neg Hx    Eczema Neg Hx    Immunodeficiency Neg Hx     SOCIAL HISTORY: Social History   Socioeconomic History   Marital status: Married    Spouse name: Not on file   Number of children: Not on file   Years of education: Not on file   Highest education level: Not on file  Occupational History   Not on file  Tobacco Use   Smoking status: Former    Types: Cigarettes    Quit date: 02/28/2002    Years since quitting: 18.7   Smokeless tobacco: Never  Vaping Use   Vaping Use: Never used  Substance and Sexual Activity   Alcohol use: No   Drug use: No   Sexual activity: Not on file  Other Topics Concern   Not on file  Social History Narrative   Not on file   Social Determinants of Health   Financial Resource Strain: Not on file  Food Insecurity: Not on file  Transportation Needs: Not on file  Physical Activity: Not on file  Stress: Not on file  Social Connections: Not on file  Intimate Partner Violence: Not on file    Allergies  Allergen Reactions   Demerol [Meperidine] Nausea And Vomiting   Metoprolol     asthma   Milk-Related Compounds Other (See Comments)    REFLUX (CHEESE)   Oatmeal Hives   Other     [place], pecans--stomach aches   Pineapple Other (See Comments)    Burning in mouth   Salmon [Fish Allergy] Other (See Comments)    And tuna and oysters  (PATIENT TOLERATES)   Penicillins Rash    Current Outpatient Medications  Medication Sig Dispense Refill   albuterol (VENTOLIN HFA) 108 (90 Base) MCG/ACT inhaler Inhale 2 puffs into the lungs every 6 (six) hours as needed for wheezing or shortness of breath. 18 g 2   aspirin EC 81 MG tablet Take 81 mg by mouth in the morning. Swallow whole.     atorvastatin (LIPITOR) 80 MG tablet Take 1 tablet (80 mg total) by mouth daily. 90 tablet 2   benazepril (LOTENSIN) 40 MG tablet Take 1 tablet (40 mg total) by mouth in the morning. 90 tablet 1   Cholecalciferol (VITAMIN D3) 50 MCG (2000 UT) TABS Take 2,000 Units by mouth in the morning.     citalopram (CELEXA) 40 MG tablet Take 1 tablet (40 mg total) by mouth daily. TAKE 1 TABLET BY MOUTH ONCE A DAY 90 tablet 1   clopidogrel (PLAVIX) 75 MG tablet Take 1 tablet (75 mg total) by mouth daily with breakfast. 90 tablet 3   diphenhydrAMINE (BENADRYL) 25 MG tablet Take 25-50 mg by mouth every 6 (six) hours as needed for itching (ECZEMA).     levocetirizine (XYZAL) 5 MG tablet Take 5 mg by mouth every evening.     levothyroxine (SYNTHROID) 75 MCG tablet Take 1 tablet (75 mcg total) by mouth daily before breakfast. 90 tablet 1   nitroGLYCERIN (NITROSTAT) 0.4 MG SL tablet Place 1 tablet (0.4 mg total) under the tongue every 5 (five) minutes as needed for chest pain. 25 tablet 3   pantoprazole (PROTONIX) 40 MG tablet Take 1 tablet (40 mg total) by mouth daily. 90 tablet 1   triamcinolone cream (KENALOG) 0.1 % Apply 1 application topically 2 (two) times daily. (Patient taking differently: Apply 1 application topically 2 (two) times daily as needed (ECZEMA).) 30 g 3   No current facility-administered medications for this visit.    REVIEW OF SYSTEMS:  [X]  denotes positive finding, [ ]  denotes negative finding Cardiac  Comments:  Chest pain or chest pressure:    Shortness of breath upon exertion:    Short of breath when lying flat:    Irregular heart rhythm:         Vascular    Pain in calf, thigh, or hip brought on by ambulation:    Pain in feet at night that wakes you up from your sleep:     Blood clot in your veins:    Leg swelling:         Pulmonary    Oxygen at home:    Productive cough:     Wheezing:         Neurologic    Sudden weakness in arms or legs:  Sudden numbness in arms or legs:     Sudden onset of difficulty speaking or slurred speech:    Temporary loss of vision in one eye:     Problems with dizziness:         Gastrointestinal    Blood in stool:     Vomited blood:         Genitourinary    Burning when urinating:     Blood in urine:        Psychiatric    Major depression:         Hematologic    Bleeding problems:    Problems with blood clotting too easily:        Skin    Rashes or ulcers:        Constitutional    Fever or chills:      PHYSICAL EXAM: Vitals:   12/08/20 1146  BP: (!) 148/75  Pulse: 71  Resp: 14  Temp: 98.2 F (36.8 C)  TempSrc: Temporal  SpO2: 93%  Weight: 93 lb (42.2 kg)  Height: 4\' 11"  (1.499 m)    GENERAL: The patient is a well-nourished female, in no acute distress. The vital signs are documented above. CARDIAC: There is a regular rate and rhythm.  VASCULAR:  Right radial pulse palpable, left radial pulse nonpalpable PULMONARY: No respiratory distress ABDOMEN: Soft and non-tender. MUSCULOSKELETAL: There are no major deformities or cyanosis. NEUROLOGIC: No focal weakness or paresthesias are detected.  Cranial nerves II through XII grossly intact. SKIN: There are no ulcers or rashes noted. PSYCHIATRIC: The patient has a normal affect.  DATA:   Carotid duplex today again shows 60-79% right ICA stenosis with a 40-59% left ICA stenosis.  Left vertebral artery has retrograde flow with monophasic flow in the left subclavian.  Assessment/Plan:  75 year old female presents for 22-month follow-up of asymptomatic carotid disease.  Her carotid duplex today again shows a right  ICA 60 to 79% stenosis and a left ICA 40-59% stenosis consistent with her duplex 6 months ago..  I discussed with her that in the setting of asymptomatic disease we recommend medical management unless there is more than 80% stenosis.  No indication for surgical intervention at this time.  We will arrange follow-up again in 6 months.   8-month, MD [facility] Office: [phone]

## 2020-12-09 ENCOUNTER — Other Ambulatory Visit: Payer: Self-pay

## 2020-12-09 DIAGNOSIS — I6523 Occlusion and stenosis of bilateral carotid arteries: Secondary | ICD-10-CM

## 2020-12-17 DIAGNOSIS — H353211 Exudative age-related macular degeneration, right eye, with active choroidal neovascularization: Secondary | ICD-10-CM | POA: Diagnosis not present

## 2021-01-05 DIAGNOSIS — H04123 Dry eye syndrome of bilateral lacrimal glands: Secondary | ICD-10-CM | POA: Diagnosis not present

## 2021-01-05 DIAGNOSIS — H43813 Vitreous degeneration, bilateral: Secondary | ICD-10-CM | POA: Diagnosis not present

## 2021-01-05 DIAGNOSIS — H353231 Exudative age-related macular degeneration, bilateral, with active choroidal neovascularization: Secondary | ICD-10-CM | POA: Diagnosis not present

## 2021-01-05 DIAGNOSIS — H35033 Hypertensive retinopathy, bilateral: Secondary | ICD-10-CM | POA: Diagnosis not present

## 2021-01-12 DIAGNOSIS — H353231 Exudative age-related macular degeneration, bilateral, with active choroidal neovascularization: Secondary | ICD-10-CM | POA: Diagnosis not present

## 2021-02-01 ENCOUNTER — Other Ambulatory Visit: Payer: Self-pay | Admitting: Nurse Practitioner

## 2021-02-01 DIAGNOSIS — I10 Essential (primary) hypertension: Secondary | ICD-10-CM

## 2021-02-01 DIAGNOSIS — F419 Anxiety disorder, unspecified: Secondary | ICD-10-CM

## 2021-02-09 DIAGNOSIS — H353211 Exudative age-related macular degeneration, right eye, with active choroidal neovascularization: Secondary | ICD-10-CM | POA: Diagnosis not present

## 2021-02-09 DIAGNOSIS — H353221 Exudative age-related macular degeneration, left eye, with active choroidal neovascularization: Secondary | ICD-10-CM | POA: Diagnosis not present

## 2021-02-09 DIAGNOSIS — H353231 Exudative age-related macular degeneration, bilateral, with active choroidal neovascularization: Secondary | ICD-10-CM | POA: Diagnosis not present

## 2021-02-09 DIAGNOSIS — H43813 Vitreous degeneration, bilateral: Secondary | ICD-10-CM | POA: Diagnosis not present

## 2021-04-02 ENCOUNTER — Other Ambulatory Visit: Payer: Self-pay | Admitting: Cardiology

## 2021-04-02 NOTE — Telephone Encounter (Signed)
This is Dr. Christopher's pt 

## 2021-04-30 ENCOUNTER — Other Ambulatory Visit: Payer: Self-pay | Admitting: Family Medicine

## 2021-04-30 DIAGNOSIS — I10 Essential (primary) hypertension: Secondary | ICD-10-CM

## 2021-05-10 ENCOUNTER — Other Ambulatory Visit: Payer: Self-pay | Admitting: Cardiology

## 2021-05-18 ENCOUNTER — Other Ambulatory Visit: Payer: Self-pay

## 2021-05-18 ENCOUNTER — Ambulatory Visit (HOSPITAL_BASED_OUTPATIENT_CLINIC_OR_DEPARTMENT_OTHER): Payer: Medicare Other | Admitting: Cardiology

## 2021-05-18 ENCOUNTER — Encounter (HOSPITAL_BASED_OUTPATIENT_CLINIC_OR_DEPARTMENT_OTHER): Payer: Self-pay | Admitting: Cardiology

## 2021-05-18 VITALS — BP 138/74 | Ht 59.0 in | Wt 89.3 lb

## 2021-05-18 DIAGNOSIS — I2510 Atherosclerotic heart disease of native coronary artery without angina pectoris: Secondary | ICD-10-CM | POA: Diagnosis not present

## 2021-05-18 DIAGNOSIS — I6523 Occlusion and stenosis of bilateral carotid arteries: Secondary | ICD-10-CM

## 2021-05-18 DIAGNOSIS — Z79899 Other long term (current) drug therapy: Secondary | ICD-10-CM

## 2021-05-18 DIAGNOSIS — E785 Hyperlipidemia, unspecified: Secondary | ICD-10-CM

## 2021-05-18 DIAGNOSIS — I771 Stricture of artery: Secondary | ICD-10-CM

## 2021-05-18 DIAGNOSIS — I10 Essential (primary) hypertension: Secondary | ICD-10-CM

## 2021-05-18 DIAGNOSIS — I739 Peripheral vascular disease, unspecified: Secondary | ICD-10-CM | POA: Diagnosis not present

## 2021-05-18 NOTE — Progress Notes (Signed)
?Cardiology Office Note:   ? ?Date:  05/18/2021  ? ?ID:  Alexis [name], DOB [birth year], MRN [id number] ? ?PCP:  Eulogio Bear, NP  ?Cardiologist:  Buford Dresser, MD ? ?Referring MD: Eulogio Bear, NP  ? ?CC: follow up ? ?History of Present Illness:   ? ?Alexis [name] is a 76 y.o. female with a hx of CAD with complex PCI of RCA 05/2020, PAD (carotid artery disease, subclavian artery stenosis, claudication), hypertension, asthma, hyperlipidemia who is seen for follow up today. I initially met her 05/09/19 as a new consult at the request of Eulogio Bear, NP for the evaluation and management of carotid bruit. She was last seen by Dr. Gwenlyn Found in 2015. ? ?Today: ?Overall, she is feeling okay aside from developing some allergies to her cat. She does not monitor blood pressure at home. In clinic her BP is 138/74. ? ?Last night she experienced some chest pain that radiated deep towards her back. She is not sure how long this lasted, as she woke up in pain. There was pain with inspiration, but she was otherwise not short of breath. ? ?Also she reports macular degeneration in both eyes. She has received injection treatments for this. ? ?Of note, she is taking an iron supplement daily. ? ?She denies any palpitations, or peripheral edema. No lightheadedness, headaches, syncope, orthopnea, or PND. No hematuria or hematochezia. ? ? ?Past Medical History:  ?Diagnosis Date  ? Anxiety   ? Claudication [facility])   ? stent in bil iliac 11/25/10  ? Coronary artery disease   ? GERD (gastroesophageal reflux disease)   ? HTN (hypertension)   ? Hyperlipemia   ? Hypothyroidism   ? Murmur, cardiac   ? echo 10/12/10- EF >55%, mod sclerotic aortic valve  ? PVD (peripheral vascular disease) ([place])   ? lower ext and upper ext-L subclavian stenosis by ultrasound, nl nuclear test- 10/27/10  ? Tobacco abuse   ? Vitamin D deficiency   ? ? ?Past Surgical History:  ?Procedure Laterality Date  ? CORONARY STENT INTERVENTION N/A  06/03/2020  ? Procedure: CORONARY STENT INTERVENTION;  Surgeon: Wellington Hampshire, MD;  Location: [place] [facility];  Service: Cardiovascular;  Laterality: N/A;  ? INTRAVASCULAR LITHOTRIPSY  06/03/2020  ? Procedure: INTRAVASCULAR LITHOTRIPSY;  Surgeon: Wellington Hampshire, MD;  Location: [place] [facility];  Service: Cardiovascular;;  ? INTRAVASCULAR PRESSURE WIRE/FFR STUDY N/A 05/21/2020  ? Procedure: INTRAVASCULAR PRESSURE WIRE/FFR STUDY;  Surgeon: Lorretta Harp, MD;  Location: [place] [facility];  Service: Cardiovascular;  Laterality: N/A;  ? KNEE SURGERY    ? LEFT HEART CATH AND CORONARY ANGIOGRAPHY N/A 05/21/2020  ? Procedure: LEFT HEART CATH AND CORONARY ANGIOGRAPHY;  Surgeon: Lorretta Harp, MD;  Location: [place] [facility];  Service: Cardiovascular;  Laterality: N/A;  ? peripheral angio  11/25/10  ? right iliac-6x27 Express balloon expandable stent; Left iliac 6x37  ? TONSILLECTOMY    ? ? ?Current Medications: ?Current Outpatient Medications on File Prior to Visit  ?Medication Sig  ? albuterol (VENTOLIN HFA) 108 (90 Base) MCG/ACT inhaler Inhale 2 puffs into the lungs every 6 (six) hours as needed for wheezing or shortness of breath.  ? aspirin EC 81 MG tablet Take 81 mg by mouth in the morning. Swallow whole.  ? atorvastatin (LIPITOR) 80 MG tablet TAKE 1 TABLET BY MOUTH ONCE A DAY  ? benazepril (LOTENSIN) 40 MG tablet TAKE 1 TABLET BY MOUTH ONCE A DAY  ? Cholecalciferol (VITAMIN D3)  50 MCG (2000 UT) TABS Take 2,000 Units by mouth in the morning.  ? citalopram (CELEXA) 40 MG tablet TAKE 1 TABLET BY MOUTH ONCE DAILY  ? clopidogrel (PLAVIX) 75 MG tablet TAKE 1 TABLET BY MOUTH ONCE A DAY WITH BREAKFAST  ? diphenhydrAMINE (BENADRYL) 25 MG tablet Take 25-50 mg by mouth every 6 (six) hours as needed for itching (ECZEMA).  ? levocetirizine (XYZAL) 5 MG tablet Take 5 mg by mouth every evening.  ? levothyroxine (SYNTHROID) 75 MCG tablet TAKE 1 TABLET BY MOUTH EVERY MORNING BEFORE BREAKFAST  ? nitroGLYCERIN (NITROSTAT)  0.4 MG SL tablet Place 1 tablet (0.4 mg total) under the tongue every 5 (five) minutes as needed for chest pain.  ? pantoprazole (PROTONIX) 40 MG tablet Take 1 tablet (40 mg total) by mouth daily.  ? triamcinolone cream (KENALOG) 0.1 % Apply 1 application topically 2 (two) times daily. (Patient taking differently: Apply 1 application. topically 2 (two) times daily as needed (ECZEMA).)  ? ?No current facility-administered medications on file prior to visit.  ?  ? ?Allergies:   Demerol [meperidine], Metoprolol, Milk-related compounds, Oatmeal, Pineapple, and Penicillins  ? ?Social History  ? ?Tobacco Use  ? Smoking status: Former  ?  Types: Cigarettes  ?  Quit date: 02/28/2002  ?  Years since quitting: 19.2  ? Smokeless tobacco: Never  ?Vaping Use  ? Vaping Use: Never used  ?Substance Use Topics  ? Alcohol use: No  ? Drug use: No  ?quit smoking ~2004-2005, quit cold [place]. Was 2 ppd smoker for >40 years. ? ?Family History: ?family history includes Allergic rhinitis in her father, maternal grandmother, and paternal grandmother; Asthma in her maternal grandfather; Urticaria in her maternal grandmother. There is no history of Angioedema, Eczema, or Immunodeficiency.  ?father had MI, had angioplasty, no damage to his heart. Mother without heart issues. Brother is in his late 58s, had massive MI (silent) and developed heart failure. Had 5V CABG, also found to have AAA. Multiple distant family members on her mother's side with heart issues.  ? ?ROS:   ?Please see the history of present illness.   ?(+) Chest pain ?(+) Pet allergies ?Additional pertinent ROS otherwise unremarkable. ? ?EKGs/Labs/Other Studies Reviewed:   ? ?The following studies were reviewed today: ? ?Bilateral Carotid Dopplers 12/08/2020: ?Summary:  ?Right Carotid: Velocities in the right ICA are consistent with a 60-79%  ?               stenosis. The ECA appears near occlusion.  ? ?Left Carotid: Velocities in the left ICA are consistent with a 40-59%   ?stenosis.  ?              The ECA appears >50% stenosed.  ? ?Vertebrals:  Right vertebral artery demonstrates antegrade flow. Left  ?vertebral  ?             artery demonstrates retrograde flow.  ?Subclavians: Left subclavian artery demonstrates monophasic flow. ? ? ?Intravascular Lithotripsy/Coronary Stent Intervention 06/03/2020: ?Conclusion: ?Ost RCA to Prox RCA lesion is 99% stenosed. ?Mid RCA lesion is 75% stenosed. ?Post intervention, there is a 0% residual stenosis. ?A drug-eluting stent was successfully placed using a Badger 8.93Y10. ?Post intervention, there is a 15% residual stenosis. ?A drug-eluting stent was successfully placed using a STENT RESOLUTE ONYX 3.5X12. ?  ?Successful but very difficult angioplasty, intravascular lithotripsy and drug-coated balloon angioplasty to the ostial right coronary artery as well as drug-eluting stent placement to the mid right coronary artery.  Very difficult procedure due to calcifications, ostial stenosis and tortuosity. ?  ?Recommendations: ?Dual antiplatelet therapy for at least 12 months. ?Aggressive treatment of risk factors. ?Ostial RCA stent is high risk for in-stent restenosis. ? ? ?Left Heart Cath and Coronary Angiography 05/21/2020: ?Conclusion: ?Ost RCA to Prox RCA lesion is 99% stenosed. ?Mid RCA lesion is 75% stenosed. ?Ost LM to Mid LM lesion is 40% stenosed. ?Ost Cx to Prox Cx lesion is 60% stenosed. ?The left ventricular systolic function is normal. ?LV end diastolic pressure is normal. ?The left ventricular ejection fraction is 50-55% by visual estimate. ? ?IMPRESSION: Ms. Blahnik has a 99% calcified ostial dominant RCA with a downgoing origin and difficult to engage with a catheter making percutaneous intervention somewhat challenging.  In addition she has a 75% stenosis in the mid vessel.  She is a 40% distal left main stenosis that did not appear to be physiologically significant by RFR analysis and normal LV function.  I used 270 cc of  contrast for the during the procedure.  She will need consideration for RCA intervention tomorrow.  I did load her with 600 mg of p.o. Plavix in anticipation for percutaneous intervention tomorrow. ? ? ?Carotids 3/1

## 2021-05-18 NOTE — Patient Instructions (Signed)
Medication Instructions:  ?Your Physician recommend you continue on your current medication as directed.   ? ?*If you need a refill on your cardiac medications before your next appointment, please call your pharmacy* ? ? ?Lab Work: ?Your provider has recommended lab work today (Lipid). Please have this collected at [facility] at [place]. The lab is open 8:00 am - 4:30 pm. Please avoid 12:00p - 1:00p for lunch hour. You do not need an appointment. Please go to [address] [place], [place] [address]. This is in the Primary Care office on the 3rd floor, let them know you are there for blood work and they will direct you to the lab. ? ? ? ?Testing/Procedures: ?None ordered today ? ? ?Follow-Up: ?At [facility], you and your health needs are our priority.  As part of our continuing mission to provide you with exceptional heart care, we have created designated Provider Care Teams.  These Care Teams include your primary Cardiologist (physician) and Advanced Practice Providers (APPs -  Physician Assistants and Nurse Practitioners) who all work together to provide you with the care you need, when you need it. ? ?We recommend signing up for the patient portal called "MyChart".  Sign up information is provided on this After Visit Summary.  MyChart is used to connect with patients for Virtual Visits (Telemedicine).  Patients are able to view lab/test results, encounter notes, upcoming appointments, etc.  Non-urgent messages can be sent to your provider as well.   ?To learn more about what you can do with MyChart, go to [email or web address].   ? ?Your next appointment:   ?1 year(s) ? ?The format for your next appointment:   ?In Person ? ?Provider:   ?Jodelle Red, MD{ ? ? ?

## 2021-05-19 LAB — LIPID PANEL
Chol/HDL Ratio: 2.6 ratio (ref 0.0–4.4)
Cholesterol, Total: 199 mg/dL (ref 100–199)
HDL: 76 mg/dL (ref >39)
LDL Chol Calc (NIH): 103 mg/dL — ABNORMAL HIGH (ref 0–99)
Triglycerides: 116 mg/dL (ref 0–149)
VLDL Cholesterol Cal: 20 mg/dL (ref 5–40)

## 2021-06-25 ENCOUNTER — Encounter (HOSPITAL_BASED_OUTPATIENT_CLINIC_OR_DEPARTMENT_OTHER): Payer: Self-pay | Admitting: Cardiology

## 2021-06-29 ENCOUNTER — Ambulatory Visit (HOSPITAL_COMMUNITY)
Admission: RE | Admit: 2021-06-29 | Discharge: 2021-06-29 | Disposition: A | Discharge status: Home / Self Care | Payer: Medicare Other | Source: Ambulatory Visit | Attending: Vascular Surgery | Admitting: Vascular Surgery

## 2021-06-29 ENCOUNTER — Other Ambulatory Visit: Payer: Self-pay

## 2021-06-29 ENCOUNTER — Ambulatory Visit: Payer: Medicare Other | Admitting: Vascular Surgery

## 2021-06-29 ENCOUNTER — Encounter: Payer: Self-pay | Admitting: Vascular Surgery

## 2021-06-29 VITALS — BP 175/76 | HR 75 | Temp 97.5°F | Resp 14 | Ht 59.0 in | Wt 87.0 lb

## 2021-06-29 DIAGNOSIS — I6523 Occlusion and stenosis of bilateral carotid arteries: Secondary | ICD-10-CM

## 2021-06-29 NOTE — Progress Notes (Signed)
? ? ?Patient name: Alexis [name] MRN: [id number] DOB: [birth year] Sex: female ? ?REASON FOR CONSULT: 7022-month follow-up for surveillance of carotid artery disease ? ?HPI: ?Alexis Ipatricia A Dayrit is a 76 y.o. female, history of hypertension, hyperlipidemia, PAD with claudication, coronary artery disease that presents for 3622-month follow-up of her carotid artery disease.  She reports no history of TIAs or strokes.  No focal neurologic symptoms or vision loss since last follow-up.   ? ?During her last visit 6 months ago her ultrasound on 12/08/2020 showed a 60 to 79% stenosis in the right ICA and a 40 to 59% stenosis in the left ICA.  The left vertebral artery had retrograde flow.  She denies any history of neck radiation or neck surgery.  She remains on aspirin Plavix.  Denies any chest pain or shortness of breath. ? ?Past Medical History:  ?Diagnosis Date  ? Anxiety   ? Claudication [facility](HCC)   ? stent in bil iliac 11/25/10  ? Coronary artery disease   ? GERD (gastroesophageal reflux disease)   ? HTN (hypertension)   ? Hyperlipemia   ? Hypothyroidism   ? Murmur, cardiac   ? echo 10/12/10- EF >55%, mod sclerotic aortic valve  ? PVD (peripheral vascular disease) (HCC)   ? lower ext and upper ext-L subclavian stenosis by ultrasound, nl nuclear test- 10/27/10  ? Tobacco abuse   ? Vitamin D deficiency   ? ? ?Past Surgical History:  ?Procedure Laterality Date  ? CORONARY STENT INTERVENTION N/A 06/03/2020  ? Procedure: CORONARY STENT INTERVENTION;  Surgeon: Iran OuchArida, Muhammad A, MD;  [facility];  Service: Cardiovascular;  Laterality: N/A;  ? INTRAVASCULAR LITHOTRIPSY  06/03/2020  ? Procedure: INTRAVASCULAR LITHOTRIPSY;  Surgeon: Iran OuchArida, Muhammad A, MD;  [facility];  Service: Cardiovascular;;  ? INTRAVASCULAR PRESSURE WIRE/FFR STUDY N/A 05/21/2020  ? Procedure: INTRAVASCULAR PRESSURE WIRE/FFR STUDY;  Surgeon: Runell GessBerry, Jonathan J, MD;  [facility];  Service: Cardiovascular;  Laterality: N/A;  ?  KNEE SURGERY    ? LEFT HEART CATH AND CORONARY ANGIOGRAPHY N/A 05/21/2020  ? Procedure: LEFT HEART CATH AND CORONARY ANGIOGRAPHY;  Surgeon: Runell GessBerry, Jonathan J, MD;  [facility];  Service: Cardiovascular;  Laterality: N/A;  ? peripheral angio  11/25/10  ? right iliac-6x27 Express balloon expandable stent; Left iliac 6x37  ? TONSILLECTOMY    ? ? ?Family History  ?Problem Relation Age of Onset  ? Allergic rhinitis Father   ? Allergic rhinitis Maternal Grandmother   ? Urticaria Maternal Grandmother   ? Asthma Maternal Grandfather   ? Allergic rhinitis Paternal Grandmother   ? Angioedema Neg Hx   ? Eczema Neg Hx   ? Immunodeficiency Neg Hx   ? ? ?SOCIAL HISTORY: ?Social History  ? ?Socioeconomic History  ? Marital status: Married  ?  Spouse name: Not on file  ? Number of children: Not on file  ? Years of education: Not on file  ? Highest education level: Not on file  ?Occupational History  ? Not on file  ?Tobacco Use  ? Smoking status: Former  ?  Types: Cigarettes  ?  Quit date: 02/28/2002  ?  Years since quitting: 19.3  ? Smokeless tobacco: Never  ?Vaping Use  ? Vaping Use: Never used  ?Substance and Sexual Activity  ? Alcohol use: No  ? Drug use: No  ? Sexual activity: Not on file  ?Other Topics Concern  ? Not on file  ?Social History Narrative  ?  Not on file  ? ?Social Determinants of Health  ? ?Financial Resource Strain: Not on file  ?Food Insecurity: Not on file  ?Transportation Needs: Not on file  ?Physical Activity: Not on file  ?Stress: Not on file  ?Social Connections: Not on file  ?Intimate Partner Violence: Not on file  ? ? ?Allergies  ?Allergen Reactions  ? Demerol [Meperidine] Nausea And Vomiting  ? Metoprolol   ?  asthma  ? Milk-Related Compounds Other (See Comments)  ?  REFLUX (CHEESE)  ? Oatmeal Hives  ? Pineapple Other (See Comments)  ?  Burning in mouth  ? Penicillins Rash  ? ? ?Current Outpatient Medications  ?Medication Sig Dispense Refill  ? albuterol (VENTOLIN HFA) 108 (90 Base) MCG/ACT  inhaler Inhale 2 puffs into the lungs every 6 (six) hours as needed for wheezing or shortness of breath. 18 g 2  ? aspirin EC 81 MG tablet Take 81 mg by mouth in the morning. Swallow whole.    ? atorvastatin (LIPITOR) 80 MG tablet TAKE 1 TABLET BY MOUTH ONCE A DAY 90 tablet 2  ? benazepril (LOTENSIN) 40 MG tablet TAKE 1 TABLET BY MOUTH ONCE A DAY 90 tablet 1  ? Cholecalciferol (VITAMIN D3) 50 MCG (2000 UT) TABS Take 2,000 Units by mouth in the morning.    ? citalopram (CELEXA) 40 MG tablet TAKE 1 TABLET BY MOUTH ONCE DAILY 90 tablet 1  ? clopidogrel (PLAVIX) 75 MG tablet TAKE 1 TABLET BY MOUTH ONCE A DAY WITH BREAKFAST 90 tablet 0  ? diphenhydrAMINE (BENADRYL) 25 MG tablet Take 25-50 mg by mouth every 6 (six) hours as needed for itching (ECZEMA).    ? levocetirizine (XYZAL) 5 MG tablet Take 5 mg by mouth every evening.    ? levothyroxine (SYNTHROID) 75 MCG tablet TAKE 1 TABLET BY MOUTH EVERY MORNING BEFORE BREAKFAST 90 tablet 1  ? pantoprazole (PROTONIX) 40 MG tablet Take 1 tablet (40 mg total) by mouth daily. 90 tablet 1  ? triamcinolone cream (KENALOG) 0.1 % Apply 1 application topically 2 (two) times daily. (Patient taking differently: Apply 1 application. topically 2 (two) times daily as needed (ECZEMA).) 30 g 3  ? nitroGLYCERIN (NITROSTAT) 0.4 MG SL tablet Place 1 tablet (0.4 mg total) under the tongue every 5 (five) minutes as needed for chest pain. 25 tablet 3  ? ?No current facility-administered medications for this visit.  ? ? ?REVIEW OF SYSTEMS:  ?[X]  denotes positive finding, [ ]  denotes negative finding ?Cardiac  Comments:  ?Chest pain or chest pressure:    ?Shortness of breath upon exertion:    ?Short of breath when lying flat:    ?Irregular heart rhythm:    ?    ?Vascular    ?Pain in calf, thigh, or hip brought on by ambulation:    ?Pain in feet at night that wakes you up from your sleep:     ?Blood clot in your veins:    ?Leg swelling:     ?    ?Pulmonary    ?Oxygen at home:    ?Productive cough:      ?Wheezing:     ?    ?Neurologic    ?Sudden weakness in arms or legs:     ?Sudden numbness in arms or legs:     ?Sudden onset of difficulty speaking or slurred speech:    ?Temporary loss of vision in one eye:     ?Problems with dizziness:     ?    ?Gastrointestinal    ?  Blood in stool:     ?Vomited blood:     ?    ?Genitourinary    ?Burning when urinating:     ?Blood in urine:    ?    ?Psychiatric    ?Major depression:     ?    ?Hematologic    ?Bleeding problems:    ?Problems with blood clotting too easily:    ?    ?Skin    ?Rashes or ulcers:    ?    ?Constitutional    ?Fever or chills:    ? ? ?PHYSICAL EXAM: ?Vitals:  ? 06/29/21 0936 06/29/21 0940  ?BP: (!) 155/87 (!) 175/76  ?Pulse: 71 75  ?Resp: 14   ?Temp: (!) 97.5 ?F (36.4 ?C)   ?TempSrc: Temporal   ?SpO2: 99%   ?Weight: 87 lb (39.5 kg)   ?Height: 4\' 11"  (1.499 m)   ? ? ?GENERAL: The patient is a well-nourished female, in no acute distress. The vital signs are documented above. ?CARDIAC: There is a regular rate and rhythm.  ?VASCULAR:  ?Right radial pulse palpable, left radial pulse nonpalpable ?PULMONARY: No respiratory distress ?ABDOMEN: Soft and non-tender. ?MUSCULOSKELETAL: There are no major deformities or cyanosis. ?NEUROLOGIC: No focal weakness or paresthesias are detected.  Cranial nerves II through XII grossly intact. ?SKIN: There are no ulcers or rashes noted. ?PSYCHIATRIC: The patient has a normal affect. ? ?DATA:  ? ?Carotid duplex today shows progression of her right internal carotid artery disease with stenosis of greater than 80% with a velocity of 502/134.  She also has a 60 to 79% stenosis in the left ICA. ? ?Assessment/Plan: ? ?76 year old female presents for 40-month follow-up of asymptomatic carotid disease.  Discussed that her carotid duplex today shows progression in the right internal carotid artery disease now with greater than 80% stenosis.  She also has progression of the left internal carotid artery with 60 to 79% stenosis.  Discussed  in the setting of asymptomatic disease we recommend surgical intervention for greater than 80% stenosis.  I think she would benefit from a right carotid revascularization for stroke risk reduction as we di

## 2021-07-02 ENCOUNTER — Encounter (HOSPITAL_COMMUNITY): Payer: Self-pay | Admitting: Vascular Surgery

## 2021-07-02 ENCOUNTER — Other Ambulatory Visit: Payer: Self-pay

## 2021-07-02 NOTE — Progress Notes (Signed)
Spoke with pt for pre-op call. Pt has hx of CAD with stents placed a year ago. Dr. Harrell Gave is her cardiologist. LOV visit was 05/18/21. Pt denies any recent chest pain or shortness of breath. Pt is not diabetic. Pt is on Plavix. She states she was not told to hold it. I called the office and spoke with [facility] and she states they do not stop Plavix for Carotid endarterectomies. I called pt back and told her to continue the Plavix and her Aspirin. She voiced understanding. ? ?Shower instructions given to pt.  ? ?Chart sent to Anesthesia PA ?

## 2021-07-02 NOTE — Progress Notes (Signed)
Anesthesia Chart Review: ? Case: [id number] Date/Time: 07/05/21 0715  ? Procedure: RIGHT ENDARTERECTOMY CAROTID (Right)  ? Anesthesia type: General  ? Pre-op diagnosis: CAROTID ARTERY STENOSIS  ? [facility] / [facility]  ? Surgeons: Cephus Shelling, MD  ? ?  ? ? ?DISCUSSION: Patient is a 76 year old female scheduled for the above procedure. ? ?History includes former smoker (quit 02/28/02), post-operative N/V, CAD (s/p complex PCI/DES RCA 06/03/20), murmur (moderate AV sclerosis without stenosis 09/2010), carotid artery disease, PAD (s/p bilateral iliac PTA/stenting 11/25/10), HTN, asthma (moderate persistent), HLD, hypothyroidism, GERD, anemia.  ? ?Last visit with cardiologist Dr. Cristal Deer was on 05/18/2021.  She was feeling much improved post PCI.  Continue DAPT at least 12 months given complex ostial RCA stenosis and ideally lifelong if able to tolerate it.  1 year follow-up planned. ? ?Per vascular surgeon, patient to continue ASA and Plavix perioperatively.  ? ?She is a same-day work-up, anesthesia team to evaluate on the day of surgery. ? ? ?VS:  ?BP Readings from Last 3 Encounters:  ?06/29/21 (!) 175/76  ?05/18/21 138/74  ?12/08/20 (!) 148/75  ? ?Pulse Readings from Last 3 Encounters:  ?06/29/21 75  ?12/08/20 71  ?06/25/20 84  ?  ? ?PROVIDERS: ?Jodelle Red, MD is cardiologist ? ? ?LABS: For day of surgery as indicated.  ? ? ?Spirometry 09/25/17: As outlined by allergist Malachi Bonds, MD, "FEV1: 0.88/34%, FVC: 1.57/50%, FEV1/FVC: 56%" ? ? ?EKG: EKG 07/06/20 ([facility]): SR with short PR (PR 94 ms).  ? ? ?CV: ?US Carotid 06/29/21: ?Summary:  ?- Right Carotid: Velocities in the right ICA are consistent with a 80-99%  ?               stenosis. The ECA appears >50% stenosed.  ?- Left Carotid: Velocities in the left ICA are consistent with a 60-79%  ?stenosis.  ?              The ECA appears >50% stenosed.  ?- Vertebrals:  Right vertebral artery demonstrates antegrade flow. Left  ?vertebral  ?              artery demonstrates high resistant flow.  ?- Subclavians: Left subclavian artery was stenotic. Normal flow hemodynamics  ?were  ?             seen in the right subclavian artery.  ? ? ?Cardiac cath/PCI 06/03/20: ?Ost RCA to Prox RCA lesion is 99% stenosed. ?Mid RCA lesion is 75% stenosed. ?Post intervention, there is a 0% residual stenosis. ?A drug-eluting stent was successfully placed using a STENT RESOLUTE ONYX 2.25X12. ?Post intervention, there is a 15% residual stenosis. ?A drug-eluting stent was successfully placed using a STENT RESOLUTE ONYX 3.5X12. ?  ?Successful but very difficult angioplasty, intravascular lithotripsy and drug-coated balloon angioplasty to the ostial right coronary artery as well as drug-eluting stent placement to the mid right coronary artery.  Very difficult procedure due to calcifications, ostial stenosis and tortuosity. ?  ?Recommendations: ?Dual antiplatelet therapy for at least 12 months. ?Aggressive treatment of risk factors. ?Ostial RCA stent is high risk for in-stent restenosis. ? ? ?Echo 10/12/10: ?Summary: ?The study was technically difficult.  Left ventricular systolic function was normal.  EF > 55%.  The transmitral septal troll Doppler flow pattern is suggestive of impaired LV relaxation.  Mild to moderate mitral annular calcification.  Mild mitral regurgitation.  Left atrial size is normal.  RVSP is normal.  Aortic valve appears moderately sclerotic.  No aortic stenosis  or aortic regurgitation. ? ?Past Medical History:  ?Diagnosis Date  ? Anemia   ? Anxiety   ? Arthritis   ? Asthma   ? Claudication [facility])   ? stent in bil iliac 11/25/10  ? Coronary artery disease   ? GERD (gastroesophageal reflux disease)   ? HTN (hypertension)   ? Hyperlipemia   ? Hypothyroidism   ? Murmur, cardiac   ? echo 10/12/10- EF >55%, mod sclerotic aortic valve  ? PONV (postoperative nausea and vomiting)   ? PVD (peripheral vascular disease) (HCC)   ? lower ext and upper ext-L subclavian stenosis  by ultrasound, nl nuclear test- 10/27/10  ? Tobacco abuse   ? Vitamin D deficiency   ? ? ?Past Surgical History:  ?Procedure Laterality Date  ? CORONARY STENT INTERVENTION N/A 06/03/2020  ? Procedure: CORONARY STENT INTERVENTION;  Surgeon: Iran Ouch, MD;  [facility];  Service: Cardiovascular;  Laterality: N/A;  ? INTRAVASCULAR LITHOTRIPSY  06/03/2020  ? Procedure: INTRAVASCULAR LITHOTRIPSY;  Surgeon: Iran Ouch, MD;  [facility];  Service: Cardiovascular;;  ? INTRAVASCULAR PRESSURE WIRE/FFR STUDY N/A 05/21/2020  ? Procedure: INTRAVASCULAR PRESSURE WIRE/FFR STUDY;  Surgeon: Runell Gess, MD;  [facility];  Service: Cardiovascular;  Laterality: N/A;  ? KNEE SURGERY    ? LEFT HEART CATH AND CORONARY ANGIOGRAPHY N/A 05/21/2020  ? Procedure: LEFT HEART CATH AND CORONARY ANGIOGRAPHY;  Surgeon: Runell Gess, MD;  [facility];  Service: Cardiovascular;  Laterality: N/A;  ? peripheral angio  11/25/10  ? right iliac-6x27 Express balloon expandable stent; Left iliac 6x37  ? TONSILLECTOMY    ? ? ?MEDICATIONS: ?No current facility-administered medications for this encounter.  ? ? albuterol (VENTOLIN HFA) 108 (90 Base) MCG/ACT inhaler  ? aspirin EC 81 MG tablet  ? atorvastatin (LIPITOR) 80 MG tablet  ? benazepril (LOTENSIN) 40 MG tablet  ? Cholecalciferol (VITAMIN D3) 50 MCG (2000 UT) TABS  ? citalopram (CELEXA) 40 MG tablet  ? clopidogrel (PLAVIX) 75 MG tablet  ? diphenhydrAMINE (BENADRYL) 25 MG tablet  ? ferrous sulfate 324 MG TBEC  ? levothyroxine (SYNTHROID) 75 MCG tablet  ? Multiple Vitamins-Minerals (PRESERVISION AREDS 2) CAPS  ? nitroGLYCERIN (NITROSTAT) 0.4 MG SL tablet  ? pantoprazole (PROTONIX) 40 MG tablet  ? triamcinolone cream (KENALOG) 0.1 %  ? ? ?Shonna Chock, PA-C ?Surgical Short Stay/Anesthesiology ?[facility] Phone [phone] ?[facility] Phone [phone] ?07/02/2021 11:46 AM ? ? ? ? ? ? ? ?

## 2021-07-02 NOTE — Anesthesia Preprocedure Evaluation (Addendum)
Anesthesia Evaluation  ?Patient identified by MRN, date of birth, ID band ?Patient awake ? ? ? ?Reviewed: ?Allergy & Precautions, NPO status , Patient's Chart, lab work & pertinent test results ? ?History of Anesthesia Complications ?(+) PONV and history of anesthetic complications ? ?Airway ?Mallampati: II ? ?TM Distance: >3 FB ?Neck ROM: Full ? ? ? Dental ? ?(+) Edentulous Upper, Edentulous Lower, Dental Advisory Given ?  ?Pulmonary ?neg shortness of breath, asthma , neg sleep apnea, neg COPD, neg recent URI, former smoker,  ?  ?breath sounds clear to auscultation ? ? ? ? ? ? Cardiovascular ?hypertension, Pt. on medications ?(-) angina+ CAD, + Cardiac Stents and + Peripheral Vascular Disease  ?(-) CHF  ?Rhythm:Regular  ?? Ost RCA to Prox RCA lesion is 99% stenosed. ?? Mid RCA lesion is 75% stenosed. ?? Post intervention, there is a 0% residual stenosis. ?? A drug-eluting stent was successfully placed using a STENT RESOLUTE ONYX 2.25X12. ?? Post intervention, there is a 15% residual stenosis. ?? A drug-eluting stent was successfully placed using a STENT RESOLUTE ONYX 3.5X12. ?  ?Successful but very difficult angioplasty, intravascular lithotripsy and drug-coated balloon angioplasty to the ostial right coronary artery as well as drug-eluting stent placement to the mid right coronary artery.  Very difficult procedure due to calcifications, ostial stenosis and tortuosity. ?? ?Recommendations: ?Dual antiplatelet therapy for at least 12 months. ?Aggressive treatment of risk factors. ?Ostial RCA stent is high risk for in-stent restenosis. ? ?  ?Neuro/Psych ?PSYCHIATRIC DISORDERS Anxiety   ? GI/Hepatic ?Neg liver ROS, GERD  Medicated and Controlled,  ?Endo/Other  ?Hypothyroidism  ? Renal/GU ?negative Renal ROS  ? ?  ?Musculoskeletal ? ? Abdominal ?  ?Peds ? Hematology ? ?(+) Blood dyscrasia, , Lab Results ?     Component                Value               Date                 ?     WBC                       7.0                 07/06/2020           ?     HGB                      11.4                07/06/2020           ?     HCT                      36.0                07/06/2020           ?     MCV                      93                  07/06/2020           ?     PLT  279                 07/06/2020           ? ?plavix ?   ?Anesthesia Other Findings ? ? Reproductive/Obstetrics ? ?  ? ? ? ? ? ? ? ? ? ? ? ? ? ?  ?  ? ? ? ? ? ? ? ?Anesthesia Physical ?Anesthesia Plan ? ?ASA: 3 ? ?Anesthesia Plan: General  ? ?Post-op Pain Management: Ofirmev IV (intra-op)*  ? ?Induction: Intravenous ? ?PONV Risk Score and Plan: 4 or greater and Ondansetron, Dexamethasone, Propofol infusion, TIVA and Treatment may vary due to age or medical condition ? ?Airway Management Planned: Oral ETT ? ?Additional Equipment: Arterial line ? ?Intra-op Plan:  ? ?Post-operative Plan: Extubation in OR ? ?Informed Consent: I have reviewed the patients History and Physical, chart, labs and discussed the procedure including the risks, benefits and alternatives for the proposed anesthesia with the patient or authorized representative who has indicated his/her understanding and acceptance.  ? ? ? ?Dental advisory given ? ?Plan Discussed with: CRNA ? ?Anesthesia Plan Comments: (PAT note written 07/02/2021 by Shonna Chock, PA-C. ?)  ? ? ? ? ? ?Anesthesia Quick Evaluation ? ?

## 2021-07-05 ENCOUNTER — Inpatient Hospital Stay (HOSPITAL_COMMUNITY): Payer: Medicare Other | Admitting: Vascular Surgery

## 2021-07-05 ENCOUNTER — Other Ambulatory Visit: Payer: Self-pay

## 2021-07-05 ENCOUNTER — Encounter (HOSPITAL_COMMUNITY)
Admission: RE | Disposition: A | Discharge status: Home / Self Care | Payer: Self-pay | Source: Home / Self Care | Attending: Vascular Surgery

## 2021-07-05 ENCOUNTER — Inpatient Hospital Stay (HOSPITAL_COMMUNITY)
Admission: RE | Admit: 2021-07-05 | Discharge: 2021-07-06 | DRG: 039 | Disposition: A | Discharge status: Home / Self Care | Payer: Medicare Other | Attending: Vascular Surgery | Admitting: Vascular Surgery

## 2021-07-05 ENCOUNTER — Encounter (HOSPITAL_COMMUNITY): Payer: Self-pay | Admitting: Vascular Surgery

## 2021-07-05 DIAGNOSIS — Z7902 Long term (current) use of antithrombotics/antiplatelets: Secondary | ICD-10-CM

## 2021-07-05 DIAGNOSIS — Z7982 Long term (current) use of aspirin: Secondary | ICD-10-CM | POA: Diagnosis not present

## 2021-07-05 DIAGNOSIS — Z885 Allergy status to narcotic agent status: Secondary | ICD-10-CM | POA: Diagnosis not present

## 2021-07-05 DIAGNOSIS — I080 Rheumatic disorders of both mitral and aortic valves: Secondary | ICD-10-CM | POA: Diagnosis not present

## 2021-07-05 DIAGNOSIS — I708 Atherosclerosis of other arteries: Secondary | ICD-10-CM | POA: Diagnosis present

## 2021-07-05 DIAGNOSIS — K219 Gastro-esophageal reflux disease without esophagitis: Secondary | ICD-10-CM | POA: Diagnosis present

## 2021-07-05 DIAGNOSIS — Z87891 Personal history of nicotine dependence: Secondary | ICD-10-CM | POA: Diagnosis not present

## 2021-07-05 DIAGNOSIS — E559 Vitamin D deficiency, unspecified: Secondary | ICD-10-CM | POA: Diagnosis not present

## 2021-07-05 DIAGNOSIS — F419 Anxiety disorder, unspecified: Secondary | ICD-10-CM | POA: Diagnosis present

## 2021-07-05 DIAGNOSIS — I6521 Occlusion and stenosis of right carotid artery: Secondary | ICD-10-CM

## 2021-07-05 DIAGNOSIS — Z825 Family history of asthma and other chronic lower respiratory diseases: Secondary | ICD-10-CM

## 2021-07-05 DIAGNOSIS — I2510 Atherosclerotic heart disease of native coronary artery without angina pectoris: Secondary | ICD-10-CM

## 2021-07-05 DIAGNOSIS — Z91018 Allergy to other foods: Secondary | ICD-10-CM | POA: Diagnosis not present

## 2021-07-05 DIAGNOSIS — I6523 Occlusion and stenosis of bilateral carotid arteries: Secondary | ICD-10-CM | POA: Diagnosis not present

## 2021-07-05 DIAGNOSIS — I6529 Occlusion and stenosis of unspecified carotid artery: Secondary | ICD-10-CM | POA: Diagnosis present

## 2021-07-05 DIAGNOSIS — I10 Essential (primary) hypertension: Secondary | ICD-10-CM | POA: Diagnosis present

## 2021-07-05 DIAGNOSIS — Z888 Allergy status to other drugs, medicaments and biological substances status: Secondary | ICD-10-CM | POA: Diagnosis not present

## 2021-07-05 DIAGNOSIS — J45909 Unspecified asthma, uncomplicated: Secondary | ICD-10-CM | POA: Diagnosis present

## 2021-07-05 DIAGNOSIS — E785 Hyperlipidemia, unspecified: Secondary | ICD-10-CM | POA: Diagnosis present

## 2021-07-05 DIAGNOSIS — E039 Hypothyroidism, unspecified: Secondary | ICD-10-CM | POA: Diagnosis present

## 2021-07-05 DIAGNOSIS — I739 Peripheral vascular disease, unspecified: Secondary | ICD-10-CM | POA: Diagnosis present

## 2021-07-05 HISTORY — PX: OPERATIVE ULTRASOUND: SHX5996

## 2021-07-05 HISTORY — DX: Other specified postprocedural states: R11.2

## 2021-07-05 HISTORY — DX: Other specified postprocedural states: Z98.890

## 2021-07-05 HISTORY — PX: PATCH ANGIOPLASTY: SHX6230

## 2021-07-05 HISTORY — DX: Unspecified asthma, uncomplicated: J45.909

## 2021-07-05 HISTORY — DX: Anemia, unspecified: D64.9

## 2021-07-05 HISTORY — PX: ENDARTERECTOMY: SHX5162

## 2021-07-05 HISTORY — DX: Unspecified osteoarthritis, unspecified site: M19.90

## 2021-07-05 LAB — APTT: aPTT: 27 seconds (ref 24–36)

## 2021-07-05 LAB — CBC
HCT: 38.7 % (ref 36.0–46.0)
Hemoglobin: 12.8 g/dL (ref 12.0–15.0)
MCH: 35.5 pg — ABNORMAL HIGH (ref 26.0–34.0)
MCHC: 33.1 g/dL (ref 30.0–36.0)
MCV: 107.2 fL — ABNORMAL HIGH (ref 80.0–100.0)
Platelets: 293 10*3/uL (ref 150–400)
RBC: 3.61 MIL/uL — ABNORMAL LOW (ref 3.87–5.11)
RDW: 13.8 % (ref 11.5–15.5)
WBC: 6.5 10*3/uL (ref 4.0–10.5)
nRBC: 0 % (ref 0.0–0.2)

## 2021-07-05 LAB — TYPE AND SCREEN
ABO/RH(D): O POS
Antibody Screen: NEGATIVE

## 2021-07-05 LAB — URINALYSIS, ROUTINE W REFLEX MICROSCOPIC
Bilirubin Urine: NEGATIVE
Glucose, UA: NEGATIVE mg/dL
Hgb urine dipstick: NEGATIVE
Ketones, ur: NEGATIVE mg/dL
Leukocytes,Ua: NEGATIVE
Nitrite: NEGATIVE
Protein, ur: NEGATIVE mg/dL
Specific Gravity, Urine: 1.005 (ref 1.005–1.030)
pH: 7 (ref 5.0–8.0)

## 2021-07-05 LAB — COMPREHENSIVE METABOLIC PANEL
ALT: 21 U/L (ref 0–44)
AST: 31 U/L (ref 15–41)
Albumin: 4.1 g/dL (ref 3.5–5.0)
Alkaline Phosphatase: 59 U/L (ref 38–126)
Anion gap: 11 (ref 5–15)
BUN: 12 mg/dL (ref 8–23)
CO2: 30 mmol/L (ref 22–32)
Calcium: 9.4 mg/dL (ref 8.9–10.3)
Chloride: 102 mmol/L (ref 98–111)
Creatinine, Ser: 0.7 mg/dL (ref 0.44–1.00)
GFR, Estimated: >60 mL/min (ref >60)
Glucose, Bld: 77 mg/dL (ref 70–99)
Potassium: 4.1 mmol/L (ref 3.5–5.1)
Sodium: 143 mmol/L (ref 135–145)
Total Bilirubin: 0.9 mg/dL (ref 0.3–1.2)
Total Protein: 6.5 g/dL (ref 6.5–8.1)

## 2021-07-05 LAB — SURGICAL PCR SCREEN
MRSA, PCR: NEGATIVE
Staphylococcus aureus: POSITIVE — AB

## 2021-07-05 LAB — POCT ACTIVATED CLOTTING TIME: Activated Clotting Time: 233 seconds

## 2021-07-05 LAB — PROTIME-INR
INR: 1 (ref 0.8–1.2)
Prothrombin Time: 12.9 seconds (ref 11.4–15.2)

## 2021-07-05 SURGERY — ENDARTERECTOMY, CAROTID
Anesthesia: General | Laterality: Right

## 2021-07-05 MED ORDER — PANTOPRAZOLE SODIUM 40 MG PO TBEC: 40.0000 mg | DELAYED_RELEASE_TABLET | Freq: Every day | ORAL | Status: DC

## 2021-07-05 MED ORDER — HYDRALAZINE HCL 20 MG/ML IJ SOLN: 5.0000 mg | INTRAMUSCULAR | Status: DC | PRN

## 2021-07-05 MED ORDER — PROPOFOL 1000 MG/100ML IV EMUL
INTRAVENOUS | Status: AC
  Filled 2021-07-05: qty 100

## 2021-07-05 MED ORDER — VANCOMYCIN HCL 500 MG/100ML IV SOLN
500.0000 mg | INTRAVENOUS | Status: AC
  Administered 2021-07-06: 500 mg via INTRAVENOUS
  Filled 2021-07-05: qty 100

## 2021-07-05 MED ORDER — ACETAMINOPHEN 160 MG/5ML PO SOLN: 1000.0000 mg | Freq: Once | ORAL | Status: DC | PRN

## 2021-07-05 MED ORDER — VANCOMYCIN HCL IN DEXTROSE 1-5 GM/200ML-% IV SOLN
1000.0000 mg | INTRAVENOUS | Status: AC
  Administered 2021-07-05: 1000 mg via INTRAVENOUS
  Filled 2021-07-05: qty 200

## 2021-07-05 MED ORDER — HEPARIN 6000 UNIT IRRIGATION SOLUTION
Status: AC
  Filled 2021-07-05: qty 500

## 2021-07-05 MED ORDER — OXYCODONE-ACETAMINOPHEN 5-325 MG PO TABS: 1.0000 | ORAL_TABLET | ORAL | Status: DC | PRN

## 2021-07-05 MED ORDER — DOCUSATE SODIUM 100 MG PO CAPS
100.0000 mg | ORAL_CAPSULE | Freq: Every day | ORAL | Status: DC
  Administered 2021-07-06: 100 mg via ORAL
  Filled 2021-07-05: qty 1

## 2021-07-05 MED ORDER — FERROUS SULFATE 325 (65 FE) MG PO TABS
325.0000 mg | ORAL_TABLET | Freq: Every day | ORAL | Status: DC
  Administered 2021-07-06: 325 mg via ORAL
  Filled 2021-07-05: qty 1

## 2021-07-05 MED ORDER — DIPHENHYDRAMINE HCL 25 MG PO CAPS: 25.0000 mg | ORAL_CAPSULE | Freq: Four times a day (QID) | ORAL | Status: DC | PRN

## 2021-07-05 MED ORDER — POTASSIUM CHLORIDE CRYS ER 20 MEQ PO TBCR: 20.0000 meq | EXTENDED_RELEASE_TABLET | Freq: Every day | ORAL | Status: DC | PRN

## 2021-07-05 MED ORDER — DEXAMETHASONE SODIUM PHOSPHATE 10 MG/ML IJ SOLN
INTRAMUSCULAR | Status: AC
  Filled 2021-07-05: qty 1

## 2021-07-05 MED ORDER — ACETAMINOPHEN 500 MG PO TABS: 1000.0000 mg | ORAL_TABLET | Freq: Once | ORAL | Status: DC | PRN

## 2021-07-05 MED ORDER — HEPARIN SODIUM (PORCINE) 5000 UNIT/ML IJ SOLN
5000.0000 [IU] | Freq: Three times a day (TID) | INTRAMUSCULAR | Status: DC
  Administered 2021-07-05 – 2021-07-06 (×2): 5000 [IU] via SUBCUTANEOUS
  Filled 2021-07-05 (×2): qty 1

## 2021-07-05 MED ORDER — SODIUM CHLORIDE 0.9 % IV SOLN
500.0000 mL | Freq: Once | INTRAVENOUS | Status: DC | PRN
Start: 2021-07-05 — End: 2021-07-06

## 2021-07-05 MED ORDER — PROTAMINE SULFATE 10 MG/ML IV SOLN
INTRAVENOUS | Status: DC | PRN
  Administered 2021-07-05: 50 mg via INTRAVENOUS

## 2021-07-05 MED ORDER — BENAZEPRIL HCL 40 MG PO TABS
40.0000 mg | ORAL_TABLET | Freq: Every day | ORAL | Status: DC
  Administered 2021-07-05 – 2021-07-06 (×2): 40 mg via ORAL
  Filled 2021-07-05 (×2): qty 1

## 2021-07-05 MED ORDER — CHLORHEXIDINE GLUCONATE CLOTH 2 % EX PADS
6.0000 | MEDICATED_PAD | Freq: Once | CUTANEOUS | Status: DC
Start: 2021-07-05 — End: 2021-07-05

## 2021-07-05 MED ORDER — CHLORHEXIDINE GLUCONATE 0.12 % MT SOLN
OROMUCOSAL | Status: AC
  Administered 2021-07-05: 15 mL via OROMUCOSAL
  Filled 2021-07-05: qty 15

## 2021-07-05 MED ORDER — DIPHENHYDRAMINE HCL 50 MG/ML IJ SOLN
INTRAMUSCULAR | Status: DC | PRN
  Administered 2021-07-05 (×2): 12.5 mg via INTRAVENOUS

## 2021-07-05 MED ORDER — CLOPIDOGREL BISULFATE 75 MG PO TABS
75.0000 mg | ORAL_TABLET | Freq: Every day | ORAL | Status: DC
  Administered 2021-07-06: 75 mg via ORAL
  Filled 2021-07-05: qty 1

## 2021-07-05 MED ORDER — ROCURONIUM BROMIDE 100 MG/10ML IV SOLN
INTRAVENOUS | Status: DC | PRN
  Administered 2021-07-05: 40 mg via INTRAVENOUS
  Administered 2021-07-05: 10 mg via INTRAVENOUS

## 2021-07-05 MED ORDER — FENTANYL CITRATE (PF) 100 MCG/2ML IJ SOLN
25.0000 ug | INTRAMUSCULAR | Status: DC | PRN
  Administered 2021-07-05 (×2): 25 ug via INTRAVENOUS

## 2021-07-05 MED ORDER — ASPIRIN EC 81 MG PO TBEC
81.0000 mg | DELAYED_RELEASE_TABLET | Freq: Every morning | ORAL | Status: DC
  Administered 2021-07-06: 81 mg via ORAL
  Filled 2021-07-05: qty 1

## 2021-07-05 MED ORDER — ACETAMINOPHEN 10 MG/ML IV SOLN: 1000.0000 mg | Freq: Once | INTRAVENOUS | Status: DC | PRN

## 2021-07-05 MED ORDER — CHLORHEXIDINE GLUCONATE 0.12 % MT SOLN: 15.0000 mL | Freq: Once | OROMUCOSAL | Status: AC

## 2021-07-05 MED ORDER — BISACODYL 5 MG PO TBEC: 5.0000 mg | DELAYED_RELEASE_TABLET | Freq: Every day | ORAL | Status: DC | PRN

## 2021-07-05 MED ORDER — FENTANYL CITRATE (PF) 250 MCG/5ML IJ SOLN
INTRAMUSCULAR | Status: AC
  Filled 2021-07-05: qty 5

## 2021-07-05 MED ORDER — ASPIRIN EC 81 MG PO TBEC: 81.0000 mg | DELAYED_RELEASE_TABLET | Freq: Every day | ORAL | Status: DC

## 2021-07-05 MED ORDER — SUGAMMADEX SODIUM 200 MG/2ML IV SOLN
INTRAVENOUS | Status: DC | PRN
Start: 2021-07-05 — End: 2021-07-05
  Administered 2021-07-05: 100 mg via INTRAVENOUS

## 2021-07-05 MED ORDER — ORAL CARE MOUTH RINSE: 15.0000 mL | Freq: Once | OROMUCOSAL | Status: AC

## 2021-07-05 MED ORDER — 0.9 % SODIUM CHLORIDE (POUR BTL) OPTIME
TOPICAL | Status: DC | PRN
  Administered 2021-07-05 (×2): 1000 mL

## 2021-07-05 MED ORDER — ONDANSETRON HCL 4 MG/2ML IJ SOLN
INTRAMUSCULAR | Status: AC
  Filled 2021-07-05: qty 2

## 2021-07-05 MED ORDER — FENTANYL CITRATE (PF) 100 MCG/2ML IJ SOLN
INTRAMUSCULAR | Status: AC
  Filled 2021-07-05: qty 2

## 2021-07-05 MED ORDER — HEPARIN SODIUM (PORCINE) 1000 UNIT/ML IJ SOLN
INTRAMUSCULAR | Status: AC
  Filled 2021-07-05: qty 10

## 2021-07-05 MED ORDER — ALUM & MAG HYDROXIDE-SIMETH 200-200-20 MG/5ML PO SUSP: 15.0000 mL | ORAL | Status: DC | PRN

## 2021-07-05 MED ORDER — CITALOPRAM HYDROBROMIDE 40 MG PO TABS
40.0000 mg | ORAL_TABLET | Freq: Every day | ORAL | Status: DC
Start: 2021-07-06 — End: 2021-07-06
  Administered 2021-07-06: 40 mg via ORAL
  Filled 2021-07-05: qty 1

## 2021-07-05 MED ORDER — PHENYLEPHRINE HCL-NACL 20-0.9 MG/250ML-% IV SOLN
INTRAVENOUS | Status: DC | PRN
  Administered 2021-07-05: 50 ug/min via INTRAVENOUS

## 2021-07-05 MED ORDER — PANTOPRAZOLE SODIUM 40 MG PO TBEC
40.0000 mg | DELAYED_RELEASE_TABLET | Freq: Every day | ORAL | Status: DC
  Administered 2021-07-06: 40 mg via ORAL
  Filled 2021-07-05: qty 1

## 2021-07-05 MED ORDER — PHENOL 1.4 % MT LIQD: 1.0000 | OROMUCOSAL | Status: DC | PRN

## 2021-07-05 MED ORDER — MAGNESIUM SULFATE 2 GM/50ML IV SOLN: 2.0000 g | Freq: Every day | INTRAVENOUS | Status: DC | PRN

## 2021-07-05 MED ORDER — PROPOFOL 10 MG/ML IV BOLUS
INTRAVENOUS | Status: AC
  Filled 2021-07-05: qty 20

## 2021-07-05 MED ORDER — LIDOCAINE HCL (PF) 1 % IJ SOLN
INTRAMUSCULAR | Status: AC
  Filled 2021-07-05: qty 30

## 2021-07-05 MED ORDER — ACETAMINOPHEN 325 MG RE SUPP
325.0000 mg | RECTAL | Status: DC | PRN
  Filled 2021-07-05: qty 2

## 2021-07-05 MED ORDER — HEMOSTATIC AGENTS (NO CHARGE) OPTIME
TOPICAL | Status: DC | PRN
  Administered 2021-07-05 (×2): 1 via TOPICAL

## 2021-07-05 MED ORDER — CHLORHEXIDINE GLUCONATE CLOTH 2 % EX PADS
6.0000 | MEDICATED_PAD | Freq: Every day | CUTANEOUS | Status: DC
  Administered 2021-07-05 – 2021-07-06 (×2): 6 via TOPICAL

## 2021-07-05 MED ORDER — GUAIFENESIN-DM 100-10 MG/5ML PO SYRP: 15.0000 mL | ORAL_SOLUTION | ORAL | Status: DC | PRN

## 2021-07-05 MED ORDER — VANCOMYCIN HCL IN DEXTROSE 1-5 GM/200ML-% IV SOLN: 1000.0000 mg | Freq: Two times a day (BID) | INTRAVENOUS | Status: DC

## 2021-07-05 MED ORDER — LACTATED RINGERS IV SOLN: INTRAVENOUS | Status: DC | PRN

## 2021-07-05 MED ORDER — EPHEDRINE SULFATE-NACL 50-0.9 MG/10ML-% IV SOSY
PREFILLED_SYRINGE | INTRAVENOUS | Status: DC | PRN
  Administered 2021-07-05: 5 mg via INTRAVENOUS

## 2021-07-05 MED ORDER — ONDANSETRON HCL 4 MG/2ML IJ SOLN: 4.0000 mg | Freq: Four times a day (QID) | INTRAMUSCULAR | Status: DC | PRN

## 2021-07-05 MED ORDER — HEPARIN 6000 UNIT IRRIGATION SOLUTION
Status: DC | PRN
  Administered 2021-07-05: 1

## 2021-07-05 MED ORDER — VASOPRESSIN 20 UNIT/ML IV SOLN
INTRAVENOUS | Status: AC
  Filled 2021-07-05: qty 1

## 2021-07-05 MED ORDER — LACTATED RINGERS IV SOLN: INTRAVENOUS | Status: DC

## 2021-07-05 MED ORDER — SODIUM CHLORIDE 0.9 % IV SOLN
0.1500 ug/kg/min | INTRAVENOUS | Status: DC
  Administered 2021-07-05: .15 ug/kg/min via INTRAVENOUS
  Filled 2021-07-05 (×2): qty 1000

## 2021-07-05 MED ORDER — HYDROMORPHONE HCL 1 MG/ML IJ SOLN: 0.5000 mg | INTRAMUSCULAR | Status: DC | PRN

## 2021-07-05 MED ORDER — CHLORHEXIDINE GLUCONATE CLOTH 2 % EX PADS: 6.0000 | MEDICATED_PAD | Freq: Once | CUTANEOUS | Status: DC

## 2021-07-05 MED ORDER — SODIUM CHLORIDE 0.9 % IV SOLN: INTRAVENOUS | Status: DC

## 2021-07-05 MED ORDER — CLEVIDIPINE BUTYRATE 0.5 MG/ML IV EMUL
0.0000 mg/h | INTRAVENOUS | Status: DC
  Administered 2021-07-05: 3 mg/h via INTRAVENOUS

## 2021-07-05 MED ORDER — FENTANYL CITRATE (PF) 100 MCG/2ML IJ SOLN
INTRAMUSCULAR | Status: DC | PRN
  Administered 2021-07-05: 100 ug via INTRAVENOUS
  Administered 2021-07-05 (×2): 25 ug via INTRAVENOUS

## 2021-07-05 MED ORDER — EPHEDRINE 5 MG/ML INJ
INTRAVENOUS | Status: AC
  Filled 2021-07-05: qty 5

## 2021-07-05 MED ORDER — ATORVASTATIN CALCIUM 80 MG PO TABS
80.0000 mg | ORAL_TABLET | Freq: Every day | ORAL | Status: DC
  Administered 2021-07-06: 80 mg via ORAL
  Filled 2021-07-05: qty 1

## 2021-07-05 MED ORDER — PROPOFOL 500 MG/50ML IV EMUL
INTRAVENOUS | Status: DC | PRN
  Administered 2021-07-05: 75 ug/kg/min via INTRAVENOUS

## 2021-07-05 MED ORDER — LIDOCAINE 2% (20 MG/ML) 5 ML SYRINGE
INTRAMUSCULAR | Status: DC | PRN
  Administered 2021-07-05: 40 mg via INTRAVENOUS

## 2021-07-05 MED ORDER — HEPARIN SODIUM (PORCINE) 1000 UNIT/ML IJ SOLN
INTRAMUSCULAR | Status: DC | PRN
  Administered 2021-07-05: 2000 [IU] via INTRAVENOUS
  Administered 2021-07-05: 4000 [IU] via INTRAVENOUS

## 2021-07-05 MED ORDER — SENNOSIDES-DOCUSATE SODIUM 8.6-50 MG PO TABS: 1.0000 | ORAL_TABLET | Freq: Every evening | ORAL | Status: DC | PRN

## 2021-07-05 MED ORDER — CLEVIDIPINE BUTYRATE 0.5 MG/ML IV EMUL
INTRAVENOUS | Status: DC | PRN
  Administered 2021-07-05: 4 mg/h via INTRAVENOUS

## 2021-07-05 MED ORDER — LEVOTHYROXINE SODIUM 75 MCG PO TABS
75.0000 ug | ORAL_TABLET | Freq: Every day | ORAL | Status: DC
  Administered 2021-07-06: 75 ug via ORAL
  Filled 2021-07-05: qty 1

## 2021-07-05 MED ORDER — ONDANSETRON HCL 4 MG/2ML IJ SOLN
INTRAMUSCULAR | Status: DC | PRN
  Administered 2021-07-05: 4 mg via INTRAVENOUS

## 2021-07-05 MED ORDER — ALBUTEROL SULFATE (2.5 MG/3ML) 0.083% IN NEBU: 2.5000 mg | INHALATION_SOLUTION | Freq: Four times a day (QID) | RESPIRATORY_TRACT | Status: DC | PRN

## 2021-07-05 MED ORDER — PROPOFOL 10 MG/ML IV BOLUS
INTRAVENOUS | Status: DC | PRN
  Administered 2021-07-05: 40 mg via INTRAVENOUS

## 2021-07-05 MED ORDER — PHENYLEPHRINE 80 MCG/ML (10ML) SYRINGE FOR IV PUSH (FOR BLOOD PRESSURE SUPPORT)
PREFILLED_SYRINGE | INTRAVENOUS | Status: DC | PRN
  Administered 2021-07-05: 80 ug via INTRAVENOUS

## 2021-07-05 MED ORDER — PROTAMINE SULFATE 10 MG/ML IV SOLN
INTRAVENOUS | Status: AC
  Filled 2021-07-05: qty 5

## 2021-07-05 MED ORDER — ACETAMINOPHEN 325 MG PO TABS: 325.0000 mg | ORAL_TABLET | ORAL | Status: DC | PRN

## 2021-07-05 MED ORDER — MUPIROCIN 2 % EX OINT
1.0000 "application " | TOPICAL_OINTMENT | Freq: Two times a day (BID) | CUTANEOUS | Status: DC
  Administered 2021-07-05 – 2021-07-06 (×2): 1 via NASAL
  Filled 2021-07-05: qty 22

## 2021-07-05 SURGICAL SUPPLY — 57 items
BAG COUNTER SPONGE SURGICOUNT (BAG) ×3 IMPLANT
CANISTER SUCT 3000ML PPV (MISCELLANEOUS) ×3 IMPLANT
CATH ROBINSON RED A/P 18FR (CATHETERS) ×3 IMPLANT
CLIP VESOCCLUDE MED 24/CT (CLIP) ×3 IMPLANT
CLIP VESOCCLUDE SM WIDE 24/CT (CLIP) ×3 IMPLANT
COVER PROBE W GEL 5X96 (DRAPES) ×2 IMPLANT
COVER TRANSDUCER ULTRASND GEL (DISPOSABLE) ×3 IMPLANT
DERMABOND ADVANCED (GAUZE/BANDAGES/DRESSINGS) ×1
DERMABOND ADVANCED .7 DNX12 (GAUZE/BANDAGES/DRESSINGS) ×2 IMPLANT
DRAIN CHANNEL 15F RND FF W/TCR (WOUND CARE) IMPLANT
ELECT REM PT RETURN 9FT ADLT (ELECTROSURGICAL) ×3
ELECTRODE REM PT RTRN 9FT ADLT (ELECTROSURGICAL) ×2 IMPLANT
EVACUATOR SILICONE 100CC (DRAIN) IMPLANT
GLOVE BIO SURGEON STRL SZ 6.5 (GLOVE) ×4 IMPLANT
GLOVE BIO SURGEON STRL SZ7.5 (GLOVE) ×3 IMPLANT
GLOVE BIOGEL PI IND STRL 6.5 (GLOVE) IMPLANT
GLOVE BIOGEL PI IND STRL 8 (GLOVE) ×2 IMPLANT
GLOVE BIOGEL PI INDICATOR 6.5 (GLOVE) ×4
GLOVE BIOGEL PI INDICATOR 8 (GLOVE) ×1
GLOVE SURG SS PI 6.5 STRL IVOR (GLOVE) ×1 IMPLANT
GLOVE SURG SS PI 7.0 STRL IVOR (GLOVE) ×1 IMPLANT
GOWN STRL REUS W/ TWL LRG LVL3 (GOWN DISPOSABLE) ×4 IMPLANT
GOWN STRL REUS W/ TWL XL LVL3 (GOWN DISPOSABLE) ×4 IMPLANT
GOWN STRL REUS W/TWL LRG LVL3 (GOWN DISPOSABLE) ×4
GOWN STRL REUS W/TWL XL LVL3 (GOWN DISPOSABLE) ×2
HEMOSTAT SNOW SURGICEL 2X4 (HEMOSTASIS) ×2 IMPLANT
IV ADAPTER SYR DOUBLE MALE LL (MISCELLANEOUS) IMPLANT
KIT BASIN OR (CUSTOM PROCEDURE TRAY) ×3 IMPLANT
KIT SHUNT ARGYLE CAROTID ART 6 (VASCULAR PRODUCTS) ×1 IMPLANT
KIT TURNOVER KIT B (KITS) ×3 IMPLANT
LOOP VESSEL MINI RED (MISCELLANEOUS) IMPLANT
NDL HYPO 25GX1X1/2 BEV (NEEDLE) IMPLANT
NDL SPNL 20GX3.5 QUINCKE YW (NEEDLE) IMPLANT
NEEDLE HYPO 25GX1X1/2 BEV (NEEDLE) IMPLANT
NEEDLE SPNL 20GX3.5 QUINCKE YW (NEEDLE) IMPLANT
NS IRRIG 1000ML POUR BTL (IV SOLUTION) ×9 IMPLANT
PACK CAROTID (CUSTOM PROCEDURE TRAY) ×3 IMPLANT
PAD ARMBOARD 7.5X6 YLW CONV (MISCELLANEOUS) ×6 IMPLANT
PATCH VASC XENOSURE 1CMX6CM (Vascular Products) ×1 IMPLANT
PATCH VASC XENOSURE 1X6 (Vascular Products) IMPLANT
POSITIONER HEAD DONUT 9IN (MISCELLANEOUS) ×3 IMPLANT
SHUNT CAROTID BYPASS 10 (VASCULAR PRODUCTS) ×1 IMPLANT
SHUNT CAROTID BYPASS 12FRX15.5 (VASCULAR PRODUCTS) IMPLANT
SPONGE SURGIFOAM ABS GEL 100 (HEMOSTASIS) IMPLANT
STOPCOCK 4 WAY LG BORE MALE ST (IV SETS) IMPLANT
SUT ETHILON 3 0 PS 1 (SUTURE) IMPLANT
SUT MNCRL AB 4-0 PS2 18 (SUTURE) ×3 IMPLANT
SUT PROLENE 5 0 C 1 24 (SUTURE) ×3 IMPLANT
SUT PROLENE 6 0 BV (SUTURE) ×6 IMPLANT
SUT SILK 3 0 (SUTURE) ×1
SUT SILK 3-0 18XBRD TIE 12 (SUTURE) IMPLANT
SUT VIC AB 3-0 SH 27 (SUTURE) ×1
SUT VIC AB 3-0 SH 27X BRD (SUTURE) ×2 IMPLANT
SYR CONTROL 10ML LL (SYRINGE) IMPLANT
TOWEL GREEN STERILE (TOWEL DISPOSABLE) ×3 IMPLANT
TUBING ART PRESS 48 MALE/FEM (TUBING) IMPLANT
WATER STERILE IRR 1000ML POUR (IV SOLUTION) ×3 IMPLANT

## 2021-07-05 NOTE — OR Nursing (Signed)
Patient displayed equal bilateral strength in hands and feet, tongue midline in pre op. Post op- patient followed commands with wiggling bilateral feet and squeezing hands, tongue midline.  ?

## 2021-07-05 NOTE — Progress Notes (Signed)
[facility] ? ?Subjective  - doing well-hungry ? ? ?Objective ?102/82 ?98 ?98.5 ?F (36.9 ?C) ?16 ?90% ? ?Intake/Output Summary (Last 24 hours) at 07/05/2021 1828 ?Last data filed at 07/05/2021 1034 ?Gross per 24 hour  ?Intake 1400 ml  ?Output 50 ml  ?Net 1350 ml  ? ? ?No tongue deviation or facial droop  ?Right neck incision healing well without hematoma ?Moving all extremities ?General no acute distress ? ?Assessment/Planning: ?S/P right CEA for asymptomatic carotid stenosis ? ?Stable post op condition  ?BP stable systolic 130-150. Off Cleviprex since 12pm. ?Will D/C a line and go by cuff ? ?Mosetta Pigeon ?07/05/2021 ?6:28 PM ?-- ? ?Laboratory ?Lab Results: ?Recent Labs  ?  07/05/21 ?0646  ?WBC 6.5  ?HGB 12.8  ?HCT 38.7  ?PLT 293  ? ?BMET ?Recent Labs  ?  07/05/21 ?0646  ?NA 143  ?K 4.1  ?CL 102  ?CO2 30  ?GLUCOSE 77  ?BUN 12  ?CREATININE 0.70  ?CALCIUM 9.4  ? ? ?COAG ?Lab Results  ?Component Value Date  ? INR 1.0 07/05/2021  ? ?No results found for: PTT ? ? ? ?

## 2021-07-05 NOTE — Transfer of Care (Signed)
Immediate Anesthesia Transfer of Care Note ? ?Patient: Alexis [name] ? ?Procedure(s) Performed: RIGHT ENDARTERECTOMY CAROTID (Right) ?PATCH ANGIOPLASTY (Right) ?OPERATIVE ULTRASOUND ? ?Patient Location: PACU ? ?Anesthesia Type:General ? ?Level of Consciousness: awake, alert  and oriented ? ?Airway & Oxygen Therapy: Patient Spontanous Breathing and Patient connected to nasal cannula oxygen ? ?Post-op Assessment: Report given to RN and Post -op Vital signs reviewed and stable ? ?Post vital signs: Reviewed and stable ? ?Last Vitals:  ?Vitals Value Taken Time  ?BP 155/62 07/05/21 1033  ?Temp    ?Pulse 107 07/05/21 1033  ?Resp 22 07/05/21 1033  ?SpO2 97 % 07/05/21 1033  ?Vitals shown include unvalidated device data. ? ?Last Pain:  ?Vitals:  ? 07/05/21 0627  ?TempSrc:   ?PainSc: 0-No pain  ?   ? ?  ? ?Complications: No notable events documented. ?

## 2021-07-05 NOTE — Op Note (Addendum)
? ? ?OPERATIVE NOTE ? ?PROCEDURE:   ?1.  right carotid endarterectomy with bovine patch angioplasty ?2.  right intraoperative carotid ultrasound ? ?PRE-OPERATIVE DIAGNOSIS: right asymptomatic high grade carotid stenosis (>80%) ? ?POST-OPERATIVE DIAGNOSIS: same as above  ? ?SURGEON: Cephus Shelling, MD ? ?ASSISTANT(S): Mosetta Pigeon, PA ? ?ANESTHESIA: general ? ?ESTIMATED BLOOD LOSS: <50 mL ? ?FINDING(S): ?1.  High grade calcified right carotid plaque. ?2.  No evidence of intimal flap visualized on transverse or longitudinal ultrasonography after endarterectomy and patch angioplasty. ?3.  Continuous doppler audible flow signatures are appropriate for each carotid artery after endarterectomy.  ? ? ? ?SPECIMEN(S):  Carotid plaque (sent to Pathology) ? ?INDICATIONS:   ?Alexis [name] is a 76 y.o. female who presents with right asymptomatic high grade carotid stenosis >80%.  I discussed with the patient the risks, benefits, and alternatives to carotid endarterectomy.  I discussed the procedural details of carotid endarterectomy with the patient.  The patient is aware that the risks of carotid endarterectomy include but are not limited to: bleeding, infection, stroke, myocardial infarction, death, cranial nerve injuries both temporary and permanent, neck hematoma, possible airway compromise, labile blood pressure post-operatively, cerebral hyperperfusion syndrome, and possible need for additional interventions in the future. The patient is aware of the risks and agrees to proceed forward with the procedure.  An assistant was needed for endarterectomy and to sew the patch angioplasty. ? ?DESCRIPTION: ?After full informed written consent was obtained from the patient, the patient was brought back to the operating room and placed supine upon the operating table.  Prior to induction, the patient received IV antibiotics.  After obtaining adequate anesthesia, the patient was placed into semi-Fowler position with  a shoulder roll in place and the patient's neck slightly hyperextended and rotated away from the surgical site.  The patient was prepped in the standard fashion for a right carotid endarterectomy.  I made an incision anterior to the sternocleidomastoid muscle and dissected down through the subcutaneous tissue.  The platysma was opened with electrocautery.  I then used Bovie cautery and blunt dissection to dissect through the underlying platysma and to mobilize the anterior border of the sternocleidomastoid as well as the internal jugular vein laterally.  The facial vein was ligated with 3-0 silk and divided.  After identifying the carotid artery I used Metzenbaum scissors to bluntly dissect the common carotid artery and then controlled this with both a large vessel loop and a umbilical tape.  At this point in time the patient was given 100 units/kg of IV heparin and we checked an ACT to ensure it was greater than 250.  I then carried my dissection cephalad and mobilized the external carotid artery and superior thyroid artery and controlled each of these with a vessel loop.  I then dissected out the internal carotid artery well past the distal plaque.  The internal carotid artery was then controlled with a umbilical tape as well. I was careful to identify the vagus nerve between the internal jugular and common carotid and this was presereved.  I was also careful to identify and preserve the hypoglossal nerve and this was preserved.    Once our ACT was confirmed, I proceeded by clamping the internal carotid artery with a angled bulldog clamp.  The proximal common carotid artery was controlled with a angled debakey clamp.  The external carotid was controlled with a vessel loop.  I subsequently opened the common carotid artery with an 11 blade scalpel in longitudinal fashion and  extended the arteriotomy with Potts scissors onto the ICA past the distal plaque.  There was pulsatile backbleeding from the internal carotid  artery and I elected not to place a shunt.  I then used a Runner, broadcasting/film/video and performed a endarterectomy starting in the common carotid artery.  The external carotid artery was endarterectomized with an eversion technique and I was careful to feather the distal ICA plaque with the help of my assisant.  The specimen was passed off the field.  The endarterectomy site was then flushed with heparinized saline and I was careful to ensure there were no flaps in the endarterectomy site.  I then brought a bovine carotid patch on the field and this was sewn in place with a running anastomosis using a 6-0 Prolene distally and a 5-0 proximal with the help of my assisant.  The bovine patch was trimmed accordingly.  The artery was flushed antegrade and retrograde prior to completion of the patch.  Once the patch was complete, I flushed up the external carotid artery first prior to releasing the internal carotid artery clamp.  An intraoperative duplex was performed that showed no evidence of any flaps.  There was excellent doppler flow in the internal and external carotid artery.  Once I was happy with the intraoperative ultrasound the patient was given protamine for reversal.  I used surgicel snow to get hemostasis around the patch.  Ultimately the platysma was closed in running fashion with 3-0 Vicryl.  The skin was closed with a running 4-0 Monocryl.  Dermabond was applied with a dry sterile dressing.  The patient was awakened from anesthesia with no new neurological deficit and taken to PACU in stable condition. ?   ?COMPLICATIONS: None ? ?CONDITION: Stable ? ?Cephus Shelling, MD ?[facility] ?Office: [phone] ? ?07/05/2021, 9:43 AM  ?

## 2021-07-05 NOTE — Discharge Instructions (Signed)
   [facility]  Discharge Instructions   Carotid Surgery  Please refer to the following instructions for your post-procedure care. Your surgeon or physician assistant will discuss any changes with you.  Activity  You are encouraged to walk as much as you can. You can slowly return to normal activities but must avoid strenuous activity and heavy lifting until your doctor tell you it's okay. Avoid activities such as vacuuming or swinging a golf club. You can drive after one week if you are comfortable and you are no longer taking prescription pain medications. It is normal to feel tired for serval weeks after your surgery. It is also normal to have difficulty with sleep habits, eating, and bowel movements after surgery. These will go away with time.  Bathing/Showering  Shower daily after you go home. Do not soak in a bathtub, hot tub, or swim until the incision heals completely.  Incision Care  Shower every day. Clean your incision with mild soap and water. Pat the area dry with a clean towel. You do not need a bandage unless otherwise instructed. Do not apply any ointments or creams to your incision. You may have skin glue on your incision. Do not peel it off. It will come off on its own in about one week. Your incision may feel thickened and raised for several weeks after your surgery. This is normal and the skin will soften over time.   For Men Only: It's okay to shave around the incision but do not shave the incision itself for 2 weeks. It is common to have numbness under your chin that could last for several months.  Diet  Resume your normal diet. There are no special food restrictions following this procedure. A low fat/low cholesterol diet is recommended for all patients with vascular disease. In order to heal from your surgery, it is CRITICAL to get adequate nutrition. Your body requires vitamins, minerals, and protein. Vegetables are the best source of  vitamins and minerals. Vegetables also provide the perfect balance of protein. Processed food has little nutritional value, so try to avoid this.  Medications  Resume taking all of your medications unless your doctor or physician assistant tells you not to. If your incision is causing pain, you may take over-the- counter pain relievers such as acetaminophen (Tylenol). If you were prescribed a stronger pain medication, please be aware these medications can cause nausea and constipation. Prevent nausea by taking the medication with a snack or meal. Avoid constipation by drinking plenty of fluids and eating foods with a high amount of fiber, such as fruits, vegetables, and grains.   Do not take Tylenol if you are taking prescription pain medications.  Follow Up  Our office will schedule a follow up appointment 2-3 weeks following discharge.  Please call us immediately for any of the following conditions  . Increased pain, redness, drainage (pus) from your incision site. . Fever of 101 degrees or higher. . If you should develop stroke (slurred speech, difficulty swallowing, weakness on one side of your body, loss of vision) you should call 911 and go to the nearest emergency room. .  Reduce your risk of vascular disease:  . Stop smoking. If you would like help call [facility] at 1-800-QUIT-NOW (1-800-784-8669) or [place] at 336-586-4000. . Manage your cholesterol . Maintain a desired weight . Control your diabetes . Keep your blood pressure down .  If you have any questions, please call the office at [phone]. 

## 2021-07-05 NOTE — Anesthesia Postprocedure Evaluation (Signed)
Anesthesia Post Note ? ?Patient: Alexis [name] ? ?Procedure(s) Performed: RIGHT ENDARTERECTOMY CAROTID (Right) ?PATCH ANGIOPLASTY (Right) ?OPERATIVE ULTRASOUND ? ?  ? ?Patient location during evaluation: PACU ?Anesthesia Type: General ?Level of consciousness: awake and alert ?Pain management: pain level controlled ?Vital Signs Assessment: post-procedure vital signs reviewed and stable ?Respiratory status: spontaneous breathing, nonlabored ventilation, respiratory function stable and patient connected to nasal cannula oxygen ?Cardiovascular status: blood pressure returned to baseline and stable ?Postop Assessment: no apparent nausea or vomiting ?Anesthetic complications: no ? ? ?No notable events documented. ? ?Last Vitals:  ?Vitals:  ? 07/05/21 1245 07/05/21 1300  ?BP: 120/72 (!) 116/50  ?Pulse: 86 89  ?Resp: 18 20  ?Temp:    ?SpO2: 95% 97%  ?  ?Last Pain:  ?Vitals:  ? 07/05/21 1245  ?TempSrc:   ?PainSc: 2   ? ? ?  ?  ?  ?  ?  ?  ? ?[name] ? ? ? ? ?

## 2021-07-05 NOTE — Progress Notes (Signed)
?  Day of Surgery Note ? ? ? ?Subjective:  c/o some right shoulder pain.   ? ? ?Vitals:  ? 07/05/21 1200 07/05/21 1215  ?BP: (!) 137/50 (!) 132/55  ?Pulse: 99 87  ?Resp: 19 16  ?Temp:    ?SpO2: 94% 96%  ? ? ?Incisions:   clean and dry ?Neuro:  moving all extremities equally; tongue is midline ?Cardiac:  regular ?Lungs:  non labored ? ? ? ?Assessment/Plan:  This is a 76 y.o. female who is s/p  ?Right CEA ? ?-pt doing well in recovery and neuro intact.  ?-she has been off Cleviprex for about 30 minutes and a-line with 150 systolic.  She does have Benazepril (PTA med) ordered.  Most likely will be able to go to the floor if able to stay off of cleviprex.   ? ? ?Doreatha Massed, PA-C ?07/05/2021 ?12:21 PM ?[phone] ? ?

## 2021-07-05 NOTE — Anesthesia Procedure Notes (Signed)
Procedure Name: Intubation ?Date/Time: 07/05/2021 7:49 AM ?Performed by: Marny Lowenstein, CRNA ?Pre-anesthesia Checklist: Patient identified, Emergency Drugs available, Suction available and Patient being monitored ?Patient Re-evaluated:Patient Re-evaluated prior to induction ?Oxygen Delivery Method: Circle system utilized ?Preoxygenation: Pre-oxygenation with 100% oxygen ?Induction Type: IV induction ?Ventilation: Mask ventilation without difficulty ?Laryngoscope Size: Hyacinth Meeker and 2 ?Grade View: Grade I ?Tube type: Oral ?Tube size: 6.5 mm ?Number of attempts: 1 ?Airway Equipment and Method: Stylet ?Placement Confirmation: ETT inserted through vocal cords under direct vision, positive ETCO2 and breath sounds checked- equal and bilateral ?Secured at: 20 cm ?Tube secured with: Tape ?Dental Injury: Teeth and Oropharynx as per pre-operative assessment  ? ? ? ? ?

## 2021-07-05 NOTE — H&P (Signed)
History and Physical Interval Note: ? ?07/05/2021 ?7:28 AM ? ?Alexis [name]  has presented today for surgery, with the diagnosis of CAROTID ARTERY STENOSIS.  The various methods of treatment have been discussed with the patient and family. After consideration of risks, benefits and other options for treatment, the patient has consented to  Procedure(s): ?RIGHT ENDARTERECTOMY CAROTID (Right) as a surgical intervention.  The patient's history has been reviewed, patient examined, no change in status, stable for surgery.  I have reviewed the patient's chart and labs.  Questions were answered to the patient's satisfaction.   ? ?Right CEA.  Risks and benefits again discussed. ? ?Cephus Shelling ? ?Patient name: Alexis [name]           MRN: [id number]        DOB: Aug 14, 1945          Sex: female ?  ?REASON FOR CONSULT: 21-month follow-up for surveillance of carotid artery disease ?  ?HPI: ?Alexis [name] is a 76 y.o. female, history of hypertension, hyperlipidemia, PAD with claudication, coronary artery disease that presents for 31-month follow-up of her carotid artery disease.  She reports no history of TIAs or strokes.  No focal neurologic symptoms or vision loss since last follow-up.   ?  ?During her last visit 6 months ago her ultrasound on 12/08/2020 showed a 60 to 79% stenosis in the right ICA and a 40 to 59% stenosis in the left ICA.  The left vertebral artery had retrograde flow.  She denies any history of neck radiation or neck surgery.  She remains on aspirin Plavix.  Denies any chest pain or shortness of breath. ?  ?    ?Past Medical History:  ?Diagnosis Date  ? Anxiety    ? Claudication [facility])    ?  stent in bil iliac 11/25/10  ? Coronary artery disease    ? GERD (gastroesophageal reflux disease)    ? HTN (hypertension)    ? Hyperlipemia    ? Hypothyroidism    ? Murmur, cardiac    ?  echo 10/12/10- EF >55%, mod sclerotic aortic valve  ? PVD (peripheral vascular disease) (HCC)    ?  lower ext and upper ext-L  subclavian stenosis by ultrasound, nl nuclear test- 10/27/10  ? Tobacco abuse    ? Vitamin D deficiency    ?  ?  ?     ?Past Surgical History:  ?Procedure Laterality Date  ? CORONARY STENT INTERVENTION N/A 06/03/2020  ?  Procedure: CORONARY STENT INTERVENTION;  Surgeon: Iran Ouch, MD;  Location: [facility] INVASIVE CV LAB;  Service: Cardiovascular;  Laterality: N/A;  ? INTRAVASCULAR LITHOTRIPSY   06/03/2020  ?  Procedure: INTRAVASCULAR LITHOTRIPSY;  Surgeon: Iran Ouch, MD;  Location: [facility] INVASIVE CV LAB;  Service: Cardiovascular;;  ? INTRAVASCULAR PRESSURE WIRE/FFR STUDY N/A 05/21/2020  ?  Procedure: INTRAVASCULAR PRESSURE WIRE/FFR STUDY;  Surgeon: Runell Gess, MD;  Location: [facility] INVASIVE CV LAB;  Service: Cardiovascular;  Laterality: N/A;  ? KNEE SURGERY      ? LEFT HEART CATH AND CORONARY ANGIOGRAPHY N/A 05/21/2020  ?  Procedure: LEFT HEART CATH AND CORONARY ANGIOGRAPHY;  Surgeon: Runell Gess, MD;  Location: [facility] INVASIVE CV LAB;  Service: Cardiovascular;  Laterality: N/A;  ? peripheral angio   11/25/10  ?  right iliac-6x27 Express balloon expandable stent; Left iliac 6x37  ? TONSILLECTOMY      ?  ?  ?     ?Family History  ?Problem Relation  Age of Onset  ? Allergic rhinitis Father    ? Allergic rhinitis Maternal Grandmother    ? Urticaria Maternal Grandmother    ? Asthma Maternal Grandfather    ? Allergic rhinitis Paternal Grandmother    ? Angioedema Neg Hx    ? Eczema Neg Hx    ? Immunodeficiency Neg Hx    ?  ?  ?SOCIAL HISTORY: ?Social History  ?  ?     ?Socioeconomic History  ? Marital status: Married  ?    Spouse name: Not on file  ? Number of children: Not on file  ? Years of education: Not on file  ? Highest education level: Not on file  ?Occupational History  ? Not on file  ?Tobacco Use  ? Smoking status: Former  ?    Types: Cigarettes  ?    Quit date: 02/28/2002  ?    Years since quitting: 19.3  ? Smokeless tobacco: Never  ?Vaping Use  ? Vaping Use: Never used  ?Substance and Sexual Activity  ?  Alcohol use: No  ? Drug use: No  ? Sexual activity: Not on file  ?Other Topics Concern  ? Not on file  ?Social History Narrative  ? Not on file  ?  ?Social Determinants of Health  ?  ?Financial Resource Strain: Not on file  ?Food Insecurity: Not on file  ?Transportation Needs: Not on file  ?Physical Activity: Not on file  ?Stress: Not on file  ?Social Connections: Not on file  ?Intimate Partner Violence: Not on file  ?  ?  ?     ?Allergies  ?Allergen Reactions  ? Demerol [Meperidine] Nausea And Vomiting  ? Metoprolol    ?    asthma  ? Milk-Related Compounds Other (See Comments)  ?    REFLUX (CHEESE)  ? Oatmeal Hives  ? Pineapple Other (See Comments)  ?    Burning in mouth  ? Penicillins Rash  ?  ?  ?      ?Current Outpatient Medications  ?Medication Sig Dispense Refill  ? albuterol (VENTOLIN HFA) 108 (90 Base) MCG/ACT inhaler Inhale 2 puffs into the lungs every 6 (six) hours as needed for wheezing or shortness of breath. 18 g 2  ? aspirin EC 81 MG tablet Take 81 mg by mouth in the morning. Swallow whole.      ? atorvastatin (LIPITOR) 80 MG tablet TAKE 1 TABLET BY MOUTH ONCE A DAY 90 tablet 2  ? benazepril (LOTENSIN) 40 MG tablet TAKE 1 TABLET BY MOUTH ONCE A DAY 90 tablet 1  ? Cholecalciferol (VITAMIN D3) 50 MCG (2000 UT) TABS Take 2,000 Units by mouth in the morning.      ? citalopram (CELEXA) 40 MG tablet TAKE 1 TABLET BY MOUTH ONCE DAILY 90 tablet 1  ? clopidogrel (PLAVIX) 75 MG tablet TAKE 1 TABLET BY MOUTH ONCE A DAY WITH BREAKFAST 90 tablet 0  ? diphenhydrAMINE (BENADRYL) 25 MG tablet Take 25-50 mg by mouth every 6 (six) hours as needed for itching (ECZEMA).      ? levocetirizine (XYZAL) 5 MG tablet Take 5 mg by mouth every evening.      ? levothyroxine (SYNTHROID) 75 MCG tablet TAKE 1 TABLET BY MOUTH EVERY MORNING BEFORE BREAKFAST 90 tablet 1  ? pantoprazole (PROTONIX) 40 MG tablet Take 1 tablet (40 mg total) by mouth daily. 90 tablet 1  ? triamcinolone cream (KENALOG) 0.1 % Apply 1 application topically 2  (two) times daily. (Patient taking differently:  Apply 1 application. topically 2 (two) times daily as needed (ECZEMA).) 30 g 3  ? nitroGLYCERIN (NITROSTAT) 0.4 MG SL tablet Place 1 tablet (0.4 mg total) under the tongue every 5 (five) minutes as needed for chest pain. 25 tablet 3  ?  ?No current facility-administered medications for this visit.  ?  ?  ?REVIEW OF SYSTEMS:  ?[X]  denotes positive finding, [ ]  denotes negative finding ?Cardiac   Comments:  ?Chest pain or chest pressure:      ?Shortness of breath upon exertion:      ?Short of breath when lying flat:      ?Irregular heart rhythm:      ?       ?Vascular      ?Pain in calf, thigh, or hip brought on by ambulation:      ?Pain in feet at night that wakes you up from your sleep:       ?Blood clot in your veins:      ?Leg swelling:       ?       ?Pulmonary      ?Oxygen at home:      ?Productive cough:       ?Wheezing:       ?       ?Neurologic      ?Sudden weakness in arms or legs:       ?Sudden numbness in arms or legs:       ?Sudden onset of difficulty speaking or slurred speech:      ?Temporary loss of vision in one eye:       ?Problems with dizziness:       ?       ?Gastrointestinal      ?Blood in stool:       ?Vomited blood:       ?       ?Genitourinary      ?Burning when urinating:       ?Blood in urine:      ?       ?Psychiatric      ?Major depression:       ?       ?Hematologic      ?Bleeding problems:      ?Problems with blood clotting too easily:      ?       ?Skin      ?Rashes or ulcers:      ?       ?Constitutional      ?Fever or chills:      ?  ?  ?PHYSICAL EXAM: ?    ?Vitals:  ?  06/29/21 0936 06/29/21 0940  ?BP: (!) 155/87 (!) 175/76  ?Pulse: 71 75  ?Resp: 14    ?Temp: (!) 97.5 ?F (36.4 ?C)    ?TempSrc: Temporal    ?SpO2: 99%    ?Weight: 87 lb (39.5 kg)    ?Height: 4\' 11"  (1.499 m)    ?  ?  ?GENERAL: The patient is a well-nourished female, in no acute distress. The vital signs are documented above. ?CARDIAC: There is a regular rate and rhythm.   ?VASCULAR:  ?Right radial pulse palpable, left radial pulse nonpalpable ?PULMONARY: No respiratory distress ?ABDOMEN: Soft and non-tender. ?MUSCULOSKELETAL: There are no major deformities or cyanosis. ?NEUROLOG

## 2021-07-05 NOTE — Progress Notes (Signed)
PHARMACY NOTE:  ANTIMICROBIAL RENAL DOSAGE ADJUSTMENT ? ?Current antimicrobial regimen includes a mismatch between antimicrobial dosage and estimated renal function.  As per policy approved by the Pharmacy & Therapeutics and Medical Executive Committees, the antimicrobial dosage will be adjusted accordingly. ? ?Current antimicrobial dosage:  Vancomycin 1gm IV q12h x 2 doses ? ?Indication: post-op surgical prophylaxis ? ?Renal Function: ? ?Estimated Creatinine Clearance: 37 mL/min (by C-G formula based on SCr of 0.7 mg/dL). ?[]      On intermittent HD, scheduled: ?[]      On CRRT ?   ?Antimicrobial dosage has been changed to:  Vancomycin 500mg  IV q24h x 1 dose ? ?Additional comments: Also adjusted for patient wt = 40 kg ? ? ?Thank you for allowing pharmacy to be a part of this patient's care. ? ? , [facility] ?07/05/2021 5:06 PM ?

## 2021-07-06 ENCOUNTER — Encounter (HOSPITAL_COMMUNITY): Payer: Self-pay | Admitting: Vascular Surgery

## 2021-07-06 LAB — LIPID PANEL
Cholesterol: 98 mg/dL (ref 0–200)
HDL: 47 mg/dL (ref >40)
LDL Cholesterol: 40 mg/dL (ref 0–99)
Total CHOL/HDL Ratio: 2.1 RATIO
Triglycerides: 57 mg/dL (ref <150)
VLDL: 11 mg/dL (ref 0–40)

## 2021-07-06 LAB — BASIC METABOLIC PANEL
Anion gap: 7 (ref 5–15)
BUN: 13 mg/dL (ref 8–23)
CO2: 26 mmol/L (ref 22–32)
Calcium: 8 mg/dL — ABNORMAL LOW (ref 8.9–10.3)
Chloride: 105 mmol/L (ref 98–111)
Creatinine, Ser: 0.94 mg/dL (ref 0.44–1.00)
GFR, Estimated: >60 mL/min (ref >60)
Glucose, Bld: 104 mg/dL — ABNORMAL HIGH (ref 70–99)
Potassium: 3.9 mmol/L (ref 3.5–5.1)
Sodium: 138 mmol/L (ref 135–145)

## 2021-07-06 LAB — CBC
HCT: 28.1 % — ABNORMAL LOW (ref 36.0–46.0)
Hemoglobin: 9.2 g/dL — ABNORMAL LOW (ref 12.0–15.0)
MCH: 35.4 pg — ABNORMAL HIGH (ref 26.0–34.0)
MCHC: 32.7 g/dL (ref 30.0–36.0)
MCV: 108.1 fL — ABNORMAL HIGH (ref 80.0–100.0)
Platelets: 155 10*3/uL (ref 150–400)
RBC: 2.6 MIL/uL — ABNORMAL LOW (ref 3.87–5.11)
RDW: 13.9 % (ref 11.5–15.5)
WBC: 7.5 10*3/uL (ref 4.0–10.5)
nRBC: 0 % (ref 0.0–0.2)

## 2021-07-06 MED ORDER — OXYCODONE-ACETAMINOPHEN 5-325 MG PO TABS: 1.0000 | ORAL_TABLET | Freq: Four times a day (QID) | ORAL | 0 refills | Status: DC | PRN

## 2021-07-06 NOTE — TOC Transition Note (Signed)
Transition of Care (TOC) - CM/SW Discharge Note ?Donn Pierini Charity fundraiser, BSN ?Transitions of Care ?Unit 4E- RN Case Manager ?See Treatment Team for direct phone #  ? ? ?Patient Details  ?Name: Alexis [name] ?MRN: [id number] ?Date of Birth: 08/26/1945 ? ?Transition of Care (TOC) CM/SW Contact:  ?Zenda Alpers, Lenn Sink, RN ?Phone Number: ?07/06/2021, 9:55 AM ? ? ?Clinical Narrative:    ?Pt stable for transition home today s/p CEA.  Transition of Care Department [facility]) has reviewed patient and no TOC needs have been identified at this time.  Family to transport home ? ? ?Final next level of care: Home/Self Care ?Barriers to Discharge: No Barriers Identified ? ? ?Patient Goals and CMS Choice ?  ?  ?Choice offered to / list presented to : NA ? ?Discharge Placement ?  ?           ? Home ?  ?  ?  ? ?Discharge Plan and Services ?In-house Referral: NA ?Discharge Planning Services: NA ?Post Acute Care Choice: NA          ?DME Arranged: N/A ?DME Agency: NA ?  ?  ?  ?HH Arranged: NA ?HH Agency: NA ?  ?  ?  ? ?Social Determinants of Health (SDOH) Interventions ?  ? ? ?Readmission Risk Interventions ? ?  07/06/2021  ?  9:55 AM  ?Readmission Risk Prevention Plan  ?Post Dischage Appt Complete  ?Medication Screening Complete  ?Transportation Screening Complete  ? ? ? ? ? ?

## 2021-07-06 NOTE — Progress Notes (Signed)
Patient to d/c home via private vehicle. Neuro intact, VS stable. Discharge instructions reviewed and patient verbalized understanding. Fuller Canada, RN ? ?

## 2021-07-06 NOTE — Progress Notes (Addendum)
[facility] [place] ? ?Subjective  - Doing well no complaints ? ? ?Objective ?(!) 146/61 ?98 ?98.3 ?F (36.8 ?C) (Oral) ?16 ?98% ? ?Intake/Output Summary (Last 24 hours) at 07/06/2021 0740 ?Last data filed at 07/05/2021 1034 ?Gross per 24 hour  ?Intake 1400 ml  ?Output 50 ml  ?Net 1350 ml  ? ? ?Right neck incision healing well without hematoma ?No tongue deviation, no facial droop ?Moving all ext. No weakness noted ?No acute distress ? ?Assessment/Planning: ?POD # 1 right CEA for asymptomatic carotid stenosis ? ?Stable disposition tol PO's, void, pending hall ambulation prior to discharge. ?F/U in our office in 2-3 weeks ?Cont. ASA, Statin and Plavix daily ? ? ?Mosetta Pigeon ?07/06/2021 ?7:40 AM ?-- ? ?Laboratory ?Lab Results: ?Recent Labs  ?  07/05/21 ?0646 07/06/21 ?0118  ?WBC 6.5 7.5  ?HGB 12.8 9.2*  ?HCT 38.7 28.1*  ?PLT 293 155  ? ?BMET ?Recent Labs  ?  07/05/21 ?0646 07/06/21 ?0118  ?NA 143 138  ?K 4.1 3.9  ?CL 102 105  ?CO2 30 26  ?GLUCOSE 77 104*  ?BUN 12 13  ?CREATININE 0.70 0.94  ?CALCIUM 9.4 8.0*  ? ? ?COAG ?Lab Results  ?Component Value Date  ? INR 1.0 07/05/2021  ? ?No results found for: PTT ? ?I have seen and evaluated the patient. I agree with the PA note as documented above.  Postop day 1 status post right carotid endarterectomy for asymptomatic high-grade stenosis.  Neurologically intact.  Right neck incision clean dry and intact.  Plan discharge home.  We will arrange follow-up in 2 to 3 weeks for incision checks.  Continue aspirin statin Plavix at discharge with her home meds. ? ?Cephus Shelling, MD ?[facility] ?Office: [phone] ? ? ?

## 2021-07-09 NOTE — Discharge Summary (Signed)
[facility] Discharge Summary ? ? ?Patient ID:  ?Alexis [name] ?MRN: [id number] ?DOB/AGE: [birth year] 76 y.o. ? ?Admit date: 07/05/2021 ?Discharge date: 07/06/21 ?Date of Surgery: 07/05/2021 ?Surgeon: Surgeon(s): ?Cephus Shellinglark, Christopher J, MD ? ?Admission Diagnosis: ?Carotid artery stenosis [I65.29] ?Carotid stenosis [I65.29] ? ?Discharge Diagnoses:  ?Carotid artery stenosis [I65.29] ?Carotid stenosis [I65.29] ? ?Secondary Diagnoses: ?Past Medical History:  ?Diagnosis Date  ? Anemia   ? Anxiety   ? Arthritis   ? Asthma   ? Claudication [facility](HCC)   ? stent in bil iliac 11/25/10  ? Coronary artery disease   ? GERD (gastroesophageal reflux disease)   ? HTN (hypertension)   ? Hyperlipemia   ? Hypothyroidism   ? Murmur, cardiac   ? echo 10/12/10- EF >55%, mod sclerotic aortic valve  ? PONV (postoperative nausea and vomiting)   ? PVD (peripheral vascular disease) (HCC)   ? lower ext and upper ext-L subclavian stenosis by ultrasound, nl nuclear test- 10/27/10  ? Tobacco abuse   ? Vitamin D deficiency   ? ? ?Procedure(s): ?RIGHT ENDARTERECTOMY CAROTID ?PATCH ANGIOPLASTY ?OPERATIVE ULTRASOUND ? ?Discharged Condition: good ? ?HPI: Alexis [name] is a 76 y.o. female with asymptomatic ICA stenosis > 80%. ? ? ?Hospital Course:  ?Alexis [name] is a 76 y.o. female is S/P  ?Procedure(s): ?RIGHT ENDARTERECTOMY CAROTID ?PATCH ANGIOPLASTY ?OPERATIVE ULTRASOUND ?The neck incision is healing well without hematoma, she is moving all 4 extremities, voided and no neurologic deficits.  No tongue deviation or facial droop.  Discharged home in stable condition 07/06/21.  ? ? ?Significant Diagnostic Studies: ?CBC ?Lab Results  ?Component Value Date  ? WBC 7.5 07/06/2021  ? HGB 9.2 (L) 07/06/2021  ? HCT 28.1 (L) 07/06/2021  ? MCV 108.1 (H) 07/06/2021  ? PLT 155 07/06/2021  ? ? ?BMET ?   ?Component Value Date/Time  ? NA 138 07/06/2021 0118  ? NA 142 07/06/2020 1538  ? K 3.9 07/06/2021 0118  ? CL 105 07/06/2021 0118  ? CO2 26 07/06/2021 0118   ? GLUCOSE 104 (H) 07/06/2021 0118  ? BUN 13 07/06/2021 0118  ? BUN 16 07/06/2020 1538  ? CREATININE 0.94 07/06/2021 0118  ? CREATININE 0.77 11/26/2019 1153  ? CALCIUM 8.0 (L) 07/06/2021 0118  ? GFRNONAA >60 07/06/2021 0118  ? GFRNONAA 73 04/30/2019 1513  ? GFRAA 85 04/30/2019 1513  ? ?COAG ?Lab Results  ?Component Value Date  ? INR 1.0 07/05/2021  ? ? ? ?Disposition:  ?Discharge to :Home ?Discharge Instructions   ? ? Call MD for:  redness, tenderness, or signs of infection (pain, swelling, bleeding, redness, odor or green/yellow discharge around incision site)   Complete by: As directed ?  ? Call MD for:  severe or increased pain, loss or decreased feeling  in affected limb(s)   Complete by: As directed ?  ? Call MD for:  temperature >100.5   Complete by: As directed ?  ? Resume previous diet   Complete by: As directed ?  ? ?  ? ?Allergies as of 07/06/2021   ? ?   Reactions  ? Demerol [meperidine] Nausea And Vomiting  ? Metoprolol   ? asthma  ? Milk-related Compounds Other (See Comments)  ? REFLUX (CHEESE)  ? Oatmeal Hives  ? Pineapple Other (See Comments)  ? Burning in mouth  ? Penicillins Rash  ? ?  ? ?  ?Medication List  ?  ? ?TAKE these medications   ? ?albuterol 108 (90 Base) MCG/ACT inhaler ?Commonly known  as: Ventolin HFA ?Inhale 2 puffs into the lungs every 6 (six) hours as needed for wheezing or shortness of breath. ?  ?aspirin EC 81 MG tablet ?Take 81 mg by mouth in the morning. Swallow whole. ?  ?atorvastatin 80 MG tablet ?Commonly known as: LIPITOR ?TAKE 1 TABLET BY MOUTH ONCE A DAY ?  ?benazepril 40 MG tablet ?Commonly known as: LOTENSIN ?TAKE 1 TABLET BY MOUTH ONCE A DAY ?  ?citalopram 40 MG tablet ?Commonly known as: CELEXA ?TAKE 1 TABLET BY MOUTH ONCE DAILY ?  ?clopidogrel 75 MG tablet ?Commonly known as: PLAVIX ?TAKE 1 TABLET BY MOUTH ONCE A DAY WITH BREAKFAST ?  ?diphenhydrAMINE 25 MG tablet ?Commonly known as: BENADRYL ?Take 25-50 mg by mouth every 6 (six) hours as needed for itching (ECZEMA). ?   ?ferrous sulfate 324 MG Tbec ?Take 324 mg by mouth daily with breakfast. ?  ?levothyroxine 75 MCG tablet ?Commonly known as: SYNTHROID ?TAKE 1 TABLET BY MOUTH EVERY MORNING BEFORE BREAKFAST ?  ?nitroGLYCERIN 0.4 MG SL tablet ?Commonly known as: Nitrostat ?Place 1 tablet (0.4 mg total) under the tongue every 5 (five) minutes as needed for chest pain. ?  ?oxyCODONE-acetaminophen 5-325 MG tablet ?Commonly known as: PERCOCET/ROXICET ?Take 1 tablet by mouth every 6 (six) hours as needed for moderate pain. ?  ?pantoprazole 40 MG tablet ?Commonly known as: PROTONIX ?Take 1 tablet (40 mg total) by mouth daily. ?  ?PreserVision AREDS 2 Caps ?Take 1 capsule by mouth once a week. ?  ?triamcinolone cream 0.1 % ?Commonly known as: KENALOG ?Apply 1 application topically 2 (two) times daily. ?What changed:  ?when to take this ?reasons to take this ?  ?Vitamin D3 50 MCG (2000 UT) Tabs ?Take 2,000 Units by mouth in the morning. ?  ? ?  ? ?Verbal and written Discharge instructions given to the patient. Wound care per Discharge AVS ? Follow-up Information   ? ? [facility] Follow up in 3 week(s).   ?Specialty: Vascular Surgery ?Why: Office will call you to arrange your appt (sent) ?Contact information: ?[address] ?[place] [address] ?[phone] ? ?  ?  ? ?  ?  ? ?  ? ? ?Signed: ?Mosetta Pigeon ?07/09/2021, 9:45 AM ?--- For [facility] Registry use --- ?Instructions: Press F2 to tab through selections.  Delete question if not applicable.  ? ?Modified Rankin score at D/C (0-6): Rankin Score=0 ? ?IV medication needed for:  ?1. Hypertension: No ?2. Hypotension: No ? ?Post-op Complications: No ? ?1. Post-op CVA or TIA: No ? ?If yes: Event classification (right eye, left eye, right cortical, left cortical, verterobasilar, other):  ? ?If yes: Timing of event (intra-op, <6 hrs post-op, >=6 hrs post-op, unknown):  ? ?2. CN injury: No ? ?If yes: CN  injuried  ? ?3. Myocardial infarction:  No ? ?If yes: Dx by (EKG or clinical, Troponin):  ? ?4.  CHF: No ? ?5.  Dysrhythmia (new): No ? ?6. Wound infection: No ? ?7. Reperfusion symptoms: No ? ?8. Return to OR: No ? ?If yes: return to OR for (bleeding, neurologic, other CEA incision, other):  ? ?Discharge medications: ?Statin use:  Yes ?ASA use:  Yes ?Beta blocker use:  No  for medical reason not indicated ?ACE-Inhibitor use:  Yes ?P2Y12 Antagonist use: [ ]  None, [x ] Plavix, [ ]  Plasugrel, [ ]  Ticlopinine, [ ]  Ticagrelor, [ ]  Other, [ ]  No for medical reason, [ ]  Non-compliant, [ ]  Not-indicated ?Anti-coagulant use:  [ ]   None, [ ]  Warfarin, [ ]  Rivaroxaban, [ ]  Dabigatran, [ ]  Other, [ ]  No for medical reason, [ ]  Non-compliant, [ ]  Not-indicated ?

## 2021-07-20 DIAGNOSIS — H353231 Exudative age-related macular degeneration, bilateral, with active choroidal neovascularization: Secondary | ICD-10-CM | POA: Diagnosis not present

## 2021-07-27 ENCOUNTER — Ambulatory Visit (INDEPENDENT_AMBULATORY_CARE_PROVIDER_SITE_OTHER): Payer: Medicare Other | Admitting: Physician Assistant

## 2021-07-27 VITALS — BP 105/64 | HR 81 | Temp 98.1°F | Resp 18 | Ht 59.0 in | Wt 88.4 lb

## 2021-07-27 DIAGNOSIS — I6523 Occlusion and stenosis of bilateral carotid arteries: Secondary | ICD-10-CM

## 2021-07-27 NOTE — Progress Notes (Signed)
POST OPERATIVE OFFICE NOTE    CC:  F/u for surgery  HPI:  This is a 76 y.o. female who has a history of right ICA asymptomatic stenosis > 80%.  She is  s/p right CEA by on 07/05/21 by Dr. Chestine Spore.    Pt returns today for follow up.  Pt states she has no issues and is very pleased with her surgical outcome.  She denies symptoms of stroke/TIA.     Allergies  Allergen Reactions   Demerol [Meperidine] Nausea And Vomiting   Metoprolol     asthma   Milk-Related Compounds Other (See Comments)    REFLUX (CHEESE)   Oatmeal Hives   Pineapple Other (See Comments)    Burning in mouth   Penicillins Rash    Current Outpatient Medications  Medication Sig Dispense Refill   albuterol (VENTOLIN HFA) 108 (90 Base) MCG/ACT inhaler Inhale 2 puffs into the lungs every 6 (six) hours as needed for wheezing or shortness of breath. 18 g 2   aspirin EC 81 MG tablet Take 81 mg by mouth in the morning. Swallow whole.     atorvastatin (LIPITOR) 80 MG tablet TAKE 1 TABLET BY MOUTH ONCE A DAY 90 tablet 2   benazepril (LOTENSIN) 40 MG tablet TAKE 1 TABLET BY MOUTH ONCE A DAY 90 tablet 1   Cholecalciferol (VITAMIN D3) 50 MCG (2000 UT) TABS Take 2,000 Units by mouth in the morning.     citalopram (CELEXA) 40 MG tablet TAKE 1 TABLET BY MOUTH ONCE DAILY 90 tablet 1   clopidogrel (PLAVIX) 75 MG tablet TAKE 1 TABLET BY MOUTH ONCE A DAY WITH BREAKFAST 90 tablet 0   diphenhydrAMINE (BENADRYL) 25 MG tablet Take 25-50 mg by mouth every 6 (six) hours as needed for itching (ECZEMA).     ferrous sulfate 324 MG TBEC Take 324 mg by mouth daily with breakfast.     levothyroxine (SYNTHROID) 75 MCG tablet TAKE 1 TABLET BY MOUTH EVERY MORNING BEFORE BREAKFAST 90 tablet 1   Multiple Vitamins-Minerals (PRESERVISION AREDS 2) CAPS Take 1 capsule by mouth once a week.     nitroGLYCERIN (NITROSTAT) 0.4 MG SL tablet Place 1 tablet (0.4 mg total) under the tongue every 5 (five) minutes as needed for chest pain. 25 tablet 3    oxyCODONE-acetaminophen (PERCOCET/ROXICET) 5-325 MG tablet Take 1 tablet by mouth every 6 (six) hours as needed for moderate pain. 12 tablet 0   pantoprazole (PROTONIX) 40 MG tablet Take 1 tablet (40 mg total) by mouth daily. 90 tablet 1   triamcinolone cream (KENALOG) 0.1 % Apply 1 application topically 2 (two) times daily. (Patient taking differently: Apply 1 application. topically 2 (two) times daily as needed (ECZEMA).) 30 g 3   No current facility-administered medications for this visit.     ROS:  See HPI  Physical Exam:    Incision:  well healed right neck incision Extremities:  palpable radial pulses B, motor and sensation intact  no neurologic deficits.  No tongue deviation or facial droop.  Speech clear. Lungs non labored breathing    Assessment/Plan:  76 y.o. female who has a history of right ICA asymptomatic stenosis > 80%.  She is  s/p right CEA by on 07/05/21 by Dr. Chestine Spore.    Her incision has fully healed and she has no neurologic deficits.  Activity as tolerates and she will f/u in 6 months for baseline carotid duplex.  The left ICA has 60-79% stenosis with EDV of 72 as of 06/29/21.  If she develops symptoms of stroke /TIA she will call 911.    Willa Rough. Thomasena Edis, [facility] [phone]   Clinic MD:  Chestine Spore

## 2021-07-30 ENCOUNTER — Other Ambulatory Visit: Payer: Self-pay | Admitting: *Deleted

## 2021-07-30 ENCOUNTER — Other Ambulatory Visit: Payer: Self-pay | Admitting: Nurse Practitioner

## 2021-07-30 DIAGNOSIS — F419 Anxiety disorder, unspecified: Secondary | ICD-10-CM

## 2021-07-30 DIAGNOSIS — I10 Essential (primary) hypertension: Secondary | ICD-10-CM

## 2021-07-30 DIAGNOSIS — K219 Gastro-esophageal reflux disease without esophagitis: Secondary | ICD-10-CM

## 2021-07-30 DIAGNOSIS — I6523 Occlusion and stenosis of bilateral carotid arteries: Secondary | ICD-10-CM

## 2021-08-05 DIAGNOSIS — L2089 Other atopic dermatitis: Secondary | ICD-10-CM | POA: Diagnosis not present

## 2021-08-17 ENCOUNTER — Other Ambulatory Visit: Payer: Self-pay

## 2021-08-17 MED ORDER — CLOPIDOGREL BISULFATE 75 MG PO TABS: 75.0000 mg | ORAL_TABLET | Freq: Every day | ORAL | 3 refills | Status: DC

## 2021-08-20 DIAGNOSIS — H353231 Exudative age-related macular degeneration, bilateral, with active choroidal neovascularization: Secondary | ICD-10-CM | POA: Diagnosis not present

## 2021-08-20 DIAGNOSIS — H43813 Vitreous degeneration, bilateral: Secondary | ICD-10-CM | POA: Diagnosis not present

## 2021-08-20 DIAGNOSIS — H35033 Hypertensive retinopathy, bilateral: Secondary | ICD-10-CM | POA: Diagnosis not present

## 2021-08-27 ENCOUNTER — Other Ambulatory Visit: Payer: Self-pay | Admitting: Nurse Practitioner

## 2021-08-27 DIAGNOSIS — K219 Gastro-esophageal reflux disease without esophagitis: Secondary | ICD-10-CM

## 2021-08-27 NOTE — Telephone Encounter (Signed)
pt needs a new PCP- Prescriber no longer with [facility]  Requested Prescriptions  Refused Prescriptions Disp Refills  . pantoprazole (PROTONIX) 40 MG tablet [Pharmacy Med Name: PANTOPRAZOLE SODIUM 40 MG DR TAB] 90 tablet 1    Sig: TAKE 1 TABLET BY MOUTH ONCE DAILY     Gastroenterology: Proton Pump Inhibitors Failed - 08/27/2021  9:05 AM      Failed - Valid encounter within last 12 months    Recent Outpatient Visits          1 year ago Hypothyroidism, unspecified type   [facility] Valentino Nose, NP   1 year ago Essential hypertension   [facility] [place], Velna Hatchet, MD   2 years ago Encounter for medication management   [facility] Elmore Guise, FNP   3 years ago Essential hypertension   [facility] Danelle Berry, PA-C   4 years ago Peripheral vascular disease with claudication (HCC)   [facility] Dorena Bodo, PA-C

## 2021-09-20 DIAGNOSIS — H04123 Dry eye syndrome of bilateral lacrimal glands: Secondary | ICD-10-CM | POA: Diagnosis not present

## 2021-09-20 DIAGNOSIS — H43813 Vitreous degeneration, bilateral: Secondary | ICD-10-CM | POA: Diagnosis not present

## 2021-09-20 DIAGNOSIS — H353231 Exudative age-related macular degeneration, bilateral, with active choroidal neovascularization: Secondary | ICD-10-CM | POA: Diagnosis not present

## 2021-09-20 DIAGNOSIS — H35033 Hypertensive retinopathy, bilateral: Secondary | ICD-10-CM | POA: Diagnosis not present

## 2021-10-05 ENCOUNTER — Other Ambulatory Visit: Payer: Self-pay | Admitting: Nurse Practitioner

## 2021-10-05 DIAGNOSIS — I10 Essential (primary) hypertension: Secondary | ICD-10-CM

## 2021-10-05 DIAGNOSIS — F419 Anxiety disorder, unspecified: Secondary | ICD-10-CM

## 2021-10-05 DIAGNOSIS — K219 Gastro-esophageal reflux disease without esophagitis: Secondary | ICD-10-CM

## 2021-11-08 DIAGNOSIS — H43813 Vitreous degeneration, bilateral: Secondary | ICD-10-CM | POA: Diagnosis not present

## 2021-11-08 DIAGNOSIS — H353231 Exudative age-related macular degeneration, bilateral, with active choroidal neovascularization: Secondary | ICD-10-CM | POA: Diagnosis not present

## 2021-11-16 ENCOUNTER — Encounter: Payer: Self-pay | Admitting: Family Medicine

## 2021-11-16 ENCOUNTER — Ambulatory Visit (INDEPENDENT_AMBULATORY_CARE_PROVIDER_SITE_OTHER): Payer: Medicare Other | Admitting: Family Medicine

## 2021-11-16 VITALS — BP 115/64 | HR 81 | Temp 97.8°F | Ht 59.0 in | Wt 87.0 lb

## 2021-11-16 DIAGNOSIS — I6521 Occlusion and stenosis of right carotid artery: Secondary | ICD-10-CM | POA: Diagnosis not present

## 2021-11-16 DIAGNOSIS — E785 Hyperlipidemia, unspecified: Secondary | ICD-10-CM

## 2021-11-16 DIAGNOSIS — F419 Anxiety disorder, unspecified: Secondary | ICD-10-CM

## 2021-11-16 DIAGNOSIS — Z0000 Encounter for general adult medical examination without abnormal findings: Secondary | ICD-10-CM

## 2021-11-16 DIAGNOSIS — I10 Essential (primary) hypertension: Secondary | ICD-10-CM | POA: Diagnosis not present

## 2021-11-16 DIAGNOSIS — K219 Gastro-esophageal reflux disease without esophagitis: Secondary | ICD-10-CM

## 2021-11-16 DIAGNOSIS — E039 Hypothyroidism, unspecified: Secondary | ICD-10-CM

## 2021-11-16 DIAGNOSIS — J4520 Mild intermittent asthma, uncomplicated: Secondary | ICD-10-CM | POA: Diagnosis not present

## 2021-11-16 DIAGNOSIS — E559 Vitamin D deficiency, unspecified: Secondary | ICD-10-CM

## 2021-11-16 DIAGNOSIS — Z7689 Persons encountering health services in other specified circumstances: Secondary | ICD-10-CM

## 2021-11-16 DIAGNOSIS — D649 Anemia, unspecified: Secondary | ICD-10-CM

## 2021-11-16 DIAGNOSIS — R718 Other abnormality of red blood cells: Secondary | ICD-10-CM | POA: Diagnosis not present

## 2021-11-16 MED ORDER — ALBUTEROL SULFATE HFA 108 (90 BASE) MCG/ACT IN AERS: 2.0000 | INHALATION_SPRAY | Freq: Four times a day (QID) | RESPIRATORY_TRACT | 2 refills | Status: DC | PRN

## 2021-11-16 MED ORDER — CITALOPRAM HYDROBROMIDE 40 MG PO TABS: 40.0000 mg | ORAL_TABLET | Freq: Every day | ORAL | 1 refills | Status: DC

## 2021-11-16 MED ORDER — CITALOPRAM HYDROBROMIDE 40 MG PO TABS: 20.0000 mg | ORAL_TABLET | Freq: Every day | ORAL | 1 refills | Status: DC

## 2021-11-16 MED ORDER — LEVOTHYROXINE SODIUM 75 MCG PO TABS: ORAL_TABLET | ORAL | 1 refills | Status: DC

## 2021-11-16 MED ORDER — PANTOPRAZOLE SODIUM 40 MG PO TBEC: 40.0000 mg | DELAYED_RELEASE_TABLET | Freq: Every day | ORAL | 1 refills | Status: DC

## 2021-11-16 NOTE — Progress Notes (Addendum)
Established Patient Office Visit  Subjective   Patient ID: Alexis [name], female    DOB: 1945/08/03  Age: 76 y.o. MRN: [id number]  Chief Complaint  Patient presents with   Establish Care    HPI  Alexis [name] is a very pleasant 76yo female here today to establish care with me. She declines a full physical exam today but is OK with having labs drawn and would like to discuss medication management. She has a recent history of right carotid endarterectomy 07/05/21 by Dr Chestine Spore.  Asthma: uses albuterol 1x monthly, well-controlled, allergy induced (cat) HTN, HLD, CAD: cardiology follows, denies SOB, CP, palpitations, swelling Hypothyroidism: fatigue, denies tremors, palpitations, cold/heat intolerance GERD: currently taking OTC medication (unknown), well controlled, has hiatal hernia Anxiety: caused by stress at home, would like to restart her Celexa as this has helped her in the past  She declines all immunizations and screenings. Discussed risks vs benefits.  Past Medical History:  Diagnosis Date   Anemia    Anxiety    Arthritis    Asthma    Claudication (HCC)    stent in bil iliac 11/25/10   Coronary artery disease    GERD (gastroesophageal reflux disease)    HTN (hypertension)    Hyperlipemia    Hypothyroidism    Murmur, cardiac    echo 10/12/10- EF >55%, mod sclerotic aortic valve   PONV (postoperative nausea and vomiting)    PVD (peripheral vascular disease) (HCC)    lower ext and upper ext-L subclavian stenosis by ultrasound, nl nuclear test- 10/27/10   Tobacco abuse    Vitamin D deficiency    Past Surgical History:  Procedure Laterality Date   CORONARY STENT INTERVENTION N/A 06/03/2020   Procedure: CORONARY STENT INTERVENTION;  Surgeon: Iran Ouch, MD;  Location: [facility];  Service: Cardiovascular;  Laterality: N/A;   ENDARTERECTOMY Right 07/05/2021   Procedure: RIGHT ENDARTERECTOMY CAROTID;  Surgeon: Cephus Shelling, MD;  Location: [facility] OR;  Service:  Vascular;  Laterality: Right;   INTRAVASCULAR LITHOTRIPSY  06/03/2020   Procedure: INTRAVASCULAR LITHOTRIPSY;  Surgeon: Iran Ouch, MD;  Location: [facility];  Service: Cardiovascular;;   INTRAVASCULAR PRESSURE WIRE/FFR STUDY N/A 05/21/2020   Procedure: INTRAVASCULAR PRESSURE WIRE/FFR STUDY;  Surgeon: Runell Gess, MD;  Location: [facility];  Service: Cardiovascular;  Laterality: N/A;   KNEE SURGERY     LEFT HEART CATH AND CORONARY ANGIOGRAPHY N/A 05/21/2020   Procedure: LEFT HEART CATH AND CORONARY ANGIOGRAPHY;  Surgeon: Runell Gess, MD;  Location: [facility];  Service: Cardiovascular;  Laterality: N/A;   OPERATIVE ULTRASOUND  07/05/2021   Procedure: OPERATIVE ULTRASOUND;  Surgeon: Cephus Shelling, MD;  Location: [facility] OR;  Service: Vascular;;   PATCH ANGIOPLASTY Right 07/05/2021   Procedure: PATCH ANGIOPLASTY;  Surgeon: Cephus Shelling, MD;  Location: [facility] OR;  Service: Vascular;  Laterality: Right;   peripheral angio  11/25/10   right iliac-6x27 Express balloon expandable stent; Left iliac 6x37   TONSILLECTOMY     Current Outpatient Medications on File Prior to Visit  Medication Sig Dispense Refill   aspirin EC 81 MG tablet Take 81 mg by mouth in the morning. Swallow whole.     atorvastatin (LIPITOR) 80 MG tablet TAKE 1 TABLET BY MOUTH ONCE A DAY 90 tablet 2   benazepril (LOTENSIN) 40 MG tablet TAKE 1 TABLET BY MOUTH ONCE A DAY 90 tablet 1   Cholecalciferol (VITAMIN D3) 50 MCG (2000 UT)  TABS Take 2,000 Units by mouth in the morning.     clopidogrel (PLAVIX) 75 MG tablet Take 1 tablet (75 mg total) by mouth daily with breakfast. 90 tablet 3   diphenhydrAMINE (BENADRYL) 25 MG tablet Take 25-50 mg by mouth every 6 (six) hours as needed for itching (ECZEMA).     ferrous sulfate 324 MG TBEC Take 324 mg by mouth daily with breakfast.     Multiple Vitamins-Minerals (PRESERVISION AREDS 2) CAPS Take 1 capsule by mouth once a week.     oxyCODONE-acetaminophen  (PERCOCET/ROXICET) 5-325 MG tablet Take 1 tablet by mouth every 6 (six) hours as needed for moderate pain. 12 tablet 0   triamcinolone cream (KENALOG) 0.1 % Apply 1 application topically 2 (two) times daily. (Patient taking differently: Apply 1 application  topically 2 (two) times daily as needed (ECZEMA).) 30 g 3   nitroGLYCERIN (NITROSTAT) 0.4 MG SL tablet Place 1 tablet (0.4 mg total) under the tongue every 5 (five) minutes as needed for chest pain. 25 tablet 3   No current facility-administered medications on file prior to visit.   Allergies  Allergen Reactions   Demerol [Meperidine] Nausea And Vomiting   Metoprolol     asthma   Milk-Related Compounds Other (See Comments)    REFLUX (CHEESE)   Oatmeal Hives   Pineapple Other (See Comments)    Burning in mouth   Penicillins Rash     Review of Systems  Respiratory: Negative.    Cardiovascular: Negative.   Neurological: Negative.   Psychiatric/Behavioral:  The patient is nervous/anxious.   All other systems reviewed and are negative.     Objective:     BP 115/64   Pulse 81   Temp 97.8 F (36.6 C) (Oral)   Ht 4\' 11"  (1.499 m)   Wt 87 lb (39.5 kg)   SpO2 95%   BMI 17.57 kg/m    Physical Exam Vitals and nursing note reviewed.  Constitutional:      Appearance: Normal appearance. She is normal weight.  Cardiovascular:     Rate and Rhythm: Normal rate and regular rhythm.     Heart sounds: Murmur heard.  Pulmonary:     Effort: Pulmonary effort is normal.     Breath sounds: Normal breath sounds.  Neurological:     Mental Status: She is alert.  Psychiatric:        Mood and Affect: Mood normal.        Behavior: Behavior normal.        Thought Content: Thought content normal.        Judgment: Judgment normal.       11/16/2021    9:28 AM  GAD 7 : Generalized Anxiety Score  Nervous, Anxious, on Edge 3  Control/stop worrying 2  Worry too much - different things 2  Trouble relaxing 1  Restless 2  Easily annoyed  or irritable 3  Afraid - awful might happen 2  Total GAD 7 Score 15  Anxiety Difficulty Somewhat difficult      No results found for any visits on 11/16/21.  Last CBC Lab Results  Component Value Date   WBC 7.5 07/06/2021   HGB 9.2 (L) 07/06/2021   HCT 28.1 (L) 07/06/2021   MCV 108.1 (H) 07/06/2021   MCH 35.4 (H) 07/06/2021   RDW 13.9 07/06/2021   PLT 155 81/85/6314   Last metabolic panel Lab Results  Component Value Date   GLUCOSE 104 (H) 07/06/2021   NA 138 07/06/2021  K 3.9 07/06/2021   CL 105 07/06/2021   CO2 26 07/06/2021   BUN 13 07/06/2021   CREATININE 0.94 07/06/2021   GFRNONAA >60 07/06/2021   CALCIUM 8.0 (L) 07/06/2021   PROT 6.5 07/05/2021   ALBUMIN 4.1 07/05/2021   BILITOT 0.9 07/05/2021   ALKPHOS 59 07/05/2021   AST 31 07/05/2021   ALT 21 07/05/2021   ANIONGAP 7 07/06/2021   Last lipids Lab Results  Component Value Date   CHOL 98 07/06/2021   HDL 47 07/06/2021   LDLCALC 40 07/06/2021   TRIG 57 07/06/2021   CHOLHDL 2.1 07/06/2021   Last hemoglobin A1c Lab Results  Component Value Date   HGBA1C 5.5 11/26/2019   Last thyroid functions Lab Results  Component Value Date   TSH 3.87 06/25/2020   Last vitamin D Lab Results  Component Value Date   VD25OH 47 04/30/2015      The ASCVD Risk score (Arnett DK, et al., 2019) failed to calculate for the following reasons:   The valid total cholesterol range is 130 to 320 mg/dL    Assessment & Plan:   Encounter to establish care  Primary hypertension - Plan: COMPLETE METABOLIC PANEL WITH GFR Followed closely by Dr Cristal Deer. Goal <130/80. Well controlled on Benazepril 40mg  daily. No medication changes at this time.  Hyperlipidemia, unspecified hyperlipidemia type Followed closely by Dr . LDL goal <70, 07/06/21 labs LDL was 40. Continue Lipitor 80mg  daily.  Anxiety - Plan: citalopram (CELEXA) 40 MG tablet, half tablet once daily GAD score 15. Desires restarting Celexa. Endorses  anxiety related to her home life. Encouraged stress management. Will restart Celexa at 20mg  daily. Follow up in 3 months.  Hypothyroidism, unspecified type - Plan: TSH Continue Synthroid 09/05/21 daily.  Gastro-esophageal Reflux Disease Well controlled with OTC Prilosec.  Mild intermittent extrinsic asthma without complication - Plan: albuterol (VENTOLIN HFA) 108 (90 Base) MCG/ACT inhaler Inhaler use once monthly without exacerbations. Refilled today.  Anemia, unspecified type - Plan: CBC with Differential/Platelet  Vitamin D deficiency - Plan: VITAMIN D 25 Hydroxy (Vit-D Deficiency, Fractures)  Carotid stenosis, right History of right ICA asymptomatic stenosis >80% s/p right CEA by Dr . Follow-up duplex planned after 6 months. Continue DAPT per cardiology.  Follow up in 3 months  , FNP

## 2021-11-18 LAB — COMPLETE METABOLIC PANEL WITH GFR
AG Ratio: 1.6 (calc) (ref 1.0–2.5)
ALT: 10 U/L (ref 6–29)
AST: 17 U/L (ref 10–35)
Albumin: 3.9 g/dL (ref 3.6–5.1)
Alkaline phosphatase (APISO): 62 U/L (ref 37–153)
BUN: 12 mg/dL (ref 7–25)
CO2: 29 mmol/L (ref 20–32)
Calcium: 9.2 mg/dL (ref 8.6–10.4)
Chloride: 102 mmol/L (ref 98–110)
Creat: 0.85 mg/dL (ref 0.60–1.00)
Globulin: 2.5 g/dL (calc) (ref 1.9–3.7)
Glucose, Bld: 81 mg/dL (ref 65–99)
Potassium: 4.4 mmol/L (ref 3.5–5.3)
Sodium: 141 mmol/L (ref 135–146)
Total Bilirubin: 0.4 mg/dL (ref 0.2–1.2)
Total Protein: 6.4 g/dL (ref 6.1–8.1)
eGFR: 71 mL/min/{1.73_m2} (ref >=60)

## 2021-11-18 LAB — TEST AUTHORIZATION

## 2021-11-18 LAB — TSH: TSH: 15.68 mIU/L — ABNORMAL HIGH (ref 0.40–4.50)

## 2021-11-18 LAB — CBC WITH DIFFERENTIAL/PLATELET
Absolute Monocytes: 643 cells/uL (ref 200–950)
Basophils Absolute: 53 cells/uL (ref 0–200)
Basophils Relative: 0.9 %
Eosinophils Absolute: 271 cells/uL (ref 15–500)
Eosinophils Relative: 4.6 %
HCT: 31.3 % — ABNORMAL LOW (ref 35.0–45.0)
Hemoglobin: 10.3 g/dL — ABNORMAL LOW (ref 11.7–15.5)
Lymphs Abs: 714 cells/uL — ABNORMAL LOW (ref 850–3900)
MCH: 33.9 pg — ABNORMAL HIGH (ref 27.0–33.0)
MCHC: 32.9 g/dL (ref 32.0–36.0)
MCV: 103 fL — ABNORMAL HIGH (ref 80.0–100.0)
MPV: 10.4 fL (ref 7.5–12.5)
Monocytes Relative: 10.9 %
Neutro Abs: 4219 cells/uL (ref 1500–7800)
Neutrophils Relative %: 71.5 %
Platelets: 344 10*3/uL (ref 140–400)
RBC: 3.04 10*6/uL — ABNORMAL LOW (ref 3.80–5.10)
RDW: 11.8 % (ref 11.0–15.0)
Total Lymphocyte: 12.1 %
WBC: 5.9 10*3/uL (ref 3.8–10.8)

## 2021-11-18 LAB — B12 AND FOLATE PANEL
Folate: 18.2 ng/mL
Vitamin B-12: 221 pg/mL (ref 200–1100)

## 2021-11-18 LAB — IRON,TIBC AND FERRITIN PANEL
%SAT: 8 % (calc) — ABNORMAL LOW (ref 16–45)
Ferritin: 17 ng/mL (ref 16–288)
Iron: 24 ug/dL — ABNORMAL LOW (ref 45–160)
TIBC: 302 mcg/dL (calc) (ref 250–450)

## 2021-11-18 LAB — RETICULOCYTES
ABS Retic: 112850 cells/uL — ABNORMAL HIGH (ref 20000–80000)
Retic Ct Pct: 3.7 %

## 2021-11-18 LAB — VITAMIN D 25 HYDROXY (VIT D DEFICIENCY, FRACTURES): Vit D, 25-Hydroxy: 43 ng/mL (ref 30–100)

## 2021-11-24 ENCOUNTER — Other Ambulatory Visit: Payer: Self-pay | Admitting: Family Medicine

## 2021-12-07 ENCOUNTER — Other Ambulatory Visit: Payer: Self-pay | Admitting: Nurse Practitioner

## 2021-12-07 DIAGNOSIS — I10 Essential (primary) hypertension: Secondary | ICD-10-CM

## 2021-12-07 NOTE — Telephone Encounter (Signed)
Requested Prescriptions  Pending Prescriptions Disp Refills  . benazepril (LOTENSIN) 40 MG tablet [Pharmacy Med Name: BENAZEPRIL HCL 40 MG TAB] 90 tablet 0    Sig: TAKE 1 TABLET BY MOUTH ONCE A DAY     Cardiovascular:  ACE Inhibitors Failed - 12/07/2021 10:32 AM      Failed - Valid encounter within last 6 months    Recent Outpatient Visits          1 year ago Hypothyroidism, unspecified type   [facility] Eulogio Bear, NP   2 years ago Essential hypertension   [place], [place] Nunner[place], MD   2 years ago Encounter for medication management   [place], [place], [place]   3 years ago Essential hypertension   [place] Delsa Grana, PA-C   4 years ago Peripheral vascular disease with claudication ([place])   [place] Orlena Sheldon, PA-C             Passed - Cr in normal range and within 180 days    Creat  Date Value Ref Range Status  11/16/2021 0.85 0.60 - 1.00 mg/dL Final         Passed - K in normal range and within 180 days    Potassium  Date Value Ref Range Status  11/16/2021 4.4 3.5 - 5.3 mmol/L Final         Passed - Patient is not pregnant      Passed - Last BP in normal range    BP Readings from Last 1 Encounters:  11/16/21 115/64

## 2021-12-27 DIAGNOSIS — H353231 Exudative age-related macular degeneration, bilateral, with active choroidal neovascularization: Secondary | ICD-10-CM | POA: Diagnosis not present

## 2021-12-27 DIAGNOSIS — H43813 Vitreous degeneration, bilateral: Secondary | ICD-10-CM | POA: Diagnosis not present

## 2022-01-27 ENCOUNTER — Telehealth (HOSPITAL_BASED_OUTPATIENT_CLINIC_OR_DEPARTMENT_OTHER): Payer: Self-pay

## 2022-01-27 MED ORDER — ATORVASTATIN CALCIUM 80 MG PO TABS: 80.0000 mg | ORAL_TABLET | Freq: Every day | ORAL | 2 refills | Status: DC

## 2022-01-27 NOTE — Telephone Encounter (Signed)
Received fax from [facility] requesting refills for Atorvastatin 80 mg. Rx request sent to pharmacy.

## 2022-03-07 DIAGNOSIS — H43813 Vitreous degeneration, bilateral: Secondary | ICD-10-CM | POA: Diagnosis not present

## 2022-03-07 DIAGNOSIS — H35033 Hypertensive retinopathy, bilateral: Secondary | ICD-10-CM | POA: Diagnosis not present

## 2022-03-07 DIAGNOSIS — H353231 Exudative age-related macular degeneration, bilateral, with active choroidal neovascularization: Secondary | ICD-10-CM | POA: Diagnosis not present

## 2022-03-09 ENCOUNTER — Other Ambulatory Visit: Payer: Self-pay | Admitting: Family Medicine

## 2022-03-09 DIAGNOSIS — I10 Essential (primary) hypertension: Secondary | ICD-10-CM

## 2022-03-10 ENCOUNTER — Other Ambulatory Visit: Payer: Self-pay

## 2022-03-10 DIAGNOSIS — I10 Essential (primary) hypertension: Secondary | ICD-10-CM

## 2022-03-10 MED ORDER — BENAZEPRIL HCL 40 MG PO TABS: 40.0000 mg | ORAL_TABLET | Freq: Every day | ORAL | 0 refills | Status: DC

## 2022-03-17 DIAGNOSIS — L2089 Other atopic dermatitis: Secondary | ICD-10-CM | POA: Diagnosis not present

## 2022-03-22 ENCOUNTER — Ambulatory Visit (INDEPENDENT_AMBULATORY_CARE_PROVIDER_SITE_OTHER): Payer: Medicare Other

## 2022-03-22 VITALS — Ht 59.0 in | Wt 87.0 lb

## 2022-03-22 DIAGNOSIS — Z0000 Encounter for general adult medical examination without abnormal findings: Secondary | ICD-10-CM

## 2022-03-22 NOTE — Progress Notes (Addendum)
Subjective:   Alexis [name] is a 77 y.o. female who presents for an Initial Medicare Annual Wellness Visit.  I connected with  JEANEE FABRE on 03/22/22 by a audio enabled telemedicine application and verified that I am speaking with the correct person using two identifiers.  Patient Location: Home  Provider Location: Office/Clinic  I discussed the limitations of evaluation and management by telemedicine. The patient expressed understanding and agreed to proceed.  Review of Systems     Cardiac Risk Factors include: advanced age (>24men, >54 women);hypertension;dyslipidemia     Objective:    Today's Vitals   03/22/22 1436  Weight: 87 lb (39.5 kg)  Height: 4\' 11"  (1.499 m)   Body mass index is 17.57 kg/m.     03/22/2022    2:41 PM 07/05/2021    6:31 AM 06/04/2020    8:52 AM 06/03/2020   12:42 PM 05/21/2020    8:07 AM  Advanced Directives  Does Patient Have a Medical Advance Directive? No No No No Yes  Type of 05/23/2020;Living will  Does patient want to make changes to medical advance directive?   No - Patient declined No - Patient declined No - Patient declined  Would patient like information on creating a medical advance directive? No - Patient declined No - Patient declined  No - Patient declined     Current Medications (verified) Outpatient Encounter Medications as of 03/22/2022  Medication Sig   albuterol (VENTOLIN HFA) 108 (90 Base) MCG/ACT inhaler Inhale 2 puffs into the lungs every 6 (six) hours as needed for wheezing or shortness of breath.   aspirin EC 81 MG tablet Take 81 mg by mouth in the morning. Swallow whole.   atorvastatin (LIPITOR) 80 MG tablet Take 1 tablet (80 mg total) by mouth daily.   benazepril (LOTENSIN) 40 MG tablet Take 1 tablet (40 mg total) by mouth daily.   Cholecalciferol (VITAMIN D3) 50 MCG (2000 UT) TABS Take 2,000 Units by mouth in the morning.   citalopram (CELEXA) 40 MG tablet Take 0.5 tablets  (20 mg total) by mouth daily.   clopidogrel (PLAVIX) 75 MG tablet Take 1 tablet (75 mg total) by mouth daily with breakfast.   diphenhydrAMINE (BENADRYL) 25 MG tablet Take 25-50 mg by mouth every 6 (six) hours as needed for itching (ECZEMA).   DUPIXENT 300 MG/2ML SOPN Inject into the skin.   ferrous sulfate 324 MG TBEC Take 324 mg by mouth daily with breakfast.   levothyroxine (SYNTHROID) 75 MCG tablet TAKE 1 TABLET BY MOUTH EVERY MORNING BEFORE BREAKFAST   Multiple Vitamins-Minerals (PRESERVISION AREDS 2) CAPS Take 1 capsule by mouth once a week.   oxyCODONE-acetaminophen (PERCOCET/ROXICET) 5-325 MG tablet Take 1 tablet by mouth every 6 (six) hours as needed for moderate pain.   triamcinolone cream (KENALOG) 0.1 % Apply 1 application topically 2 (two) times daily. (Patient taking differently: Apply 1 application  topically 2 (two) times daily as needed (ECZEMA).)   nitroGLYCERIN (NITROSTAT) 0.4 MG SL tablet Place 1 tablet (0.4 mg total) under the tongue every 5 (five) minutes as needed for chest pain.   No facility-administered encounter medications on file as of 03/22/2022.    Allergies (verified) Demerol [meperidine], Metoprolol, Milk-related compounds, Oatmeal, Pineapple, and Penicillins   History: Past Medical History:  Diagnosis Date   Anemia    Anxiety    Arthritis    Asthma    Claudication (HCC)    stent in  bil iliac 11/25/10   Coronary artery disease    GERD (gastroesophageal reflux disease)    HTN (hypertension)    Hyperlipemia    Hypothyroidism    Murmur, cardiac    echo 10/12/10- EF >55%, mod sclerotic aortic valve   PONV (postoperative nausea and vomiting)    PVD (peripheral vascular disease) (HCC)    lower ext and upper ext-L subclavian stenosis by ultrasound, nl nuclear test- 10/27/10   Tobacco abuse    Vitamin D deficiency    Past Surgical History:  Procedure Laterality Date   CORONARY STENT INTERVENTION N/A 06/03/2020   Procedure: CORONARY STENT INTERVENTION;   Surgeon: Wellington Hampshire, MD;  Location: [facility];  Service: Cardiovascular;  Laterality: N/A;   ENDARTERECTOMY Right 07/05/2021   Procedure: RIGHT ENDARTERECTOMY CAROTID;  Surgeon: Marty Heck, MD;  Location: [facility];  Service: Vascular;  Laterality: Right;   INTRAVASCULAR LITHOTRIPSY  06/03/2020   Procedure: INTRAVASCULAR LITHOTRIPSY;  Surgeon: Wellington Hampshire, MD;  Location: [facility];  Service: Cardiovascular;;   INTRAVASCULAR PRESSURE WIRE/FFR STUDY N/A 05/21/2020   Procedure: INTRAVASCULAR PRESSURE WIRE/FFR STUDY;  Surgeon: Lorretta Harp, MD;  Location: [facility];  Service: Cardiovascular;  Laterality: N/A;   KNEE SURGERY     LEFT HEART CATH AND CORONARY ANGIOGRAPHY N/A 05/21/2020   Procedure: LEFT HEART CATH AND CORONARY ANGIOGRAPHY;  Surgeon: Lorretta Harp, MD;  Location: [facility];  Service: Cardiovascular;  Laterality: N/A;   OPERATIVE ULTRASOUND  07/05/2021   Procedure: OPERATIVE ULTRASOUND;  Surgeon: Marty Heck, MD;  Location: [facility];  Service: Vascular;;   PATCH ANGIOPLASTY Right 07/05/2021   Procedure: PATCH ANGIOPLASTY;  Surgeon: Marty Heck, MD;  Location: [facility];  Service: Vascular;  Laterality: Right;   peripheral angio  11/25/10   right iliac-6x27 Express balloon expandable stent; Left iliac 6x37   TONSILLECTOMY     Family History  Problem Relation Age of Onset   Allergic rhinitis Father    Allergic rhinitis Maternal Grandmother    Urticaria Maternal Grandmother    Asthma Maternal Grandfather    Allergic rhinitis Paternal Grandmother    Angioedema Neg Hx    Eczema Neg Hx    Immunodeficiency Neg Hx    Social History   Socioeconomic History   Marital status: Married    Spouse name: Not on file   Number of children: Not on file   Years of education: Not on file   Highest education level: Not on file  Occupational History   Not on file  Tobacco Use   Smoking status: Former    Types: Cigarettes     Quit date: 02/28/2002    Years since quitting: 20.0   Smokeless tobacco: Never  Vaping Use   Vaping Use: Never used  Substance and Sexual Activity   Alcohol use: No   Drug use: No   Sexual activity: Not on file  Other Topics Concern   Not on file  Social History Narrative   Not on file   Social Determinants of Health   Financial Resource Strain: Low Risk  (03/22/2022)   Overall Financial Resource Strain (CARDIA)    Difficulty of Paying Living Expenses: Not hard at all  Food Insecurity: No Food Insecurity (03/22/2022)   Hunger Vital Sign    Worried About Running Out of Food in the Last Year: Never true    Ran Out of Food in the Last Year: Never true  Transportation Needs:  No Transportation Needs (03/22/2022)   PRAPARE - Administrator, Civil Service (Medical): No    Lack of Transportation (Non-Medical): No  Physical Activity: Unknown (03/22/2022)   Exercise Vital Sign    Days of Exercise per Week: 0 days    Minutes of Exercise per Session: Not on file  Stress: No Stress Concern Present (03/22/2022)   [employer] of Occupational Health - Occupational Stress Questionnaire    Feeling of Stress : Not at all  Social Connections: Socially Integrated (03/22/2022)   Social Connection and Isolation Panel [NHANES]    Frequency of Communication with Friends and Family: More than three times a week    Frequency of Social Gatherings with Friends and Family: Three times a week    Attends Religious Services: More than 4 times per year    Active Member of Clubs or Organizations: Yes    Attends Engineer, structural: More than 4 times per year    Marital Status: Married    Tobacco Counseling Counseling given: Not Answered   Clinical Intake:  Pre-visit preparation completed: Yes  Pain : No/denies pain  Diabetes: No  How often do you need to have someone help you when you read instructions, pamphlets, or other written materials from your doctor or pharmacy?: 1 -  Never  Diabetic?No  Interpreter Needed?: No  Information entered by :: Kandis Fantasia LPN   Activities of Daily Living    03/22/2022    2:41 PM 07/05/2021    6:27 AM  In your present state of health, do you have any difficulty performing the following activities:  Hearing? 0 0  Vision? 0 0  Difficulty concentrating or making decisions? 0 0  Walking or climbing stairs? 0 0  Dressing or bathing? 0 0  Doing errands, shopping? 0 0  Preparing Food and eating ? N   Using the Toilet? N   In the past six months, have you accidently leaked urine? N   Do you have problems with loss of bowel control? N   Managing your Medications? N   Managing your Finances? N   Housekeeping or managing your Housekeeping? N     Patient Care Team: Park Meo, FNP as PCP - General (Family Medicine) Jodelle Red, MD as PCP - Cardiology (Cardiology) Maeola Sarah, MD as Referring Physician (Ophthalmology) Caro Laroche, MD as Referring Physician (Dermatology)  Indicate any recent Medical Services you may have received from other than [facility] providers in the past year (date may be approximate).     Assessment:   This is a routine wellness examination for Alexis [name].  Hearing/Vision screen Hearing Screening - Comments:: Denies hearing difficulties  Vision Screening - Comments::  up to date with routine eye exams with Dr. Lelan Pons    Dietary issues and exercise activities discussed: Current Exercise Habits: The patient does not participate in regular exercise at present   Goals Addressed             This Visit's Progress    Maintain Independence        Depression Screen    03/22/2022    2:38 PM 11/16/2021    9:48 AM 11/26/2019   11:14 AM 03/06/2017   11:27 AM 06/22/2016   11:23 AM 04/30/2015   12:40 PM  PHQ 2/9 Scores  PHQ - 2 Score 0 1 0 0 0 0  PHQ- 9 Score  7 7  0 0    Fall Risk    03/22/2022  2:37 PM 11/16/2021    9:27 AM 11/26/2019   11:16 AM 09/28/2018    2:32 PM  03/06/2017   11:27 AM  Fall Risk   Falls in the past year? 0 0   No  Comment    Emmi Telephone Survey: data to providers prior to load   Number falls in past yr: 0 0 0    Comment    Emmi Telephone Survey Actual Response =    Injury with Fall? 0 0 0    Risk for fall due to : Impaired vision      Follow up Falls prevention discussed;Education provided;Falls evaluation completed  Falls evaluation completed      FALL RISK PREVENTION PERTAINING TO THE HOME:  Any stairs in or around the home? No  If so, are there any without handrails? No  Home free of loose throw rugs in walkways, pet beds, electrical cords, etc? Yes  Adequate lighting in your home to reduce risk of falls? No   ASSISTIVE DEVICES UTILIZED TO PREVENT FALLS:  Life alert? No  Use of a cane, walker or w/c? No  Grab bars in the bathroom? Yes  Shower chair or bench in shower? No  Elevated toilet seat or a handicapped toilet? Yes   TIMED UP AND GO:  Was the test performed? No . Telephonic visit   Cognitive Function:        03/22/2022    2:41 PM  6CIT Screen  What Year? 0 points  What month? 0 points  What time? 0 points  Count back from 20 0 points  Months in reverse 0 points  Repeat phrase 0 points  Total Score 0 points    Immunizations  There is no immunization history on file for this patient.  TDAP status: Due, Education has been provided regarding the importance of this vaccine. Advised may receive this vaccine at local pharmacy or Health Dept. Aware to provide a copy of the vaccination record if obtained from local pharmacy or Health Dept. Verbalized acceptance and understanding.  Flu Vaccine status: Declined, Education has been provided regarding the importance of this vaccine but patient still declined. Advised may receive this vaccine at local pharmacy or Health Dept. Aware to provide a copy of the vaccination record if obtained from local pharmacy or Health Dept. Verbalized acceptance and  understanding.  Pneumococcal vaccine status: Declined,  Education has been provided regarding the importance of this vaccine but patient still declined. Advised may receive this vaccine at local pharmacy or Health Dept. Aware to provide a copy of the vaccination record if obtained from local pharmacy or Health Dept. Verbalized acceptance and understanding.   Covid-19 vaccine status: Declined, Education has been provided regarding the importance of this vaccine but patient still declined. Advised may receive this vaccine at local pharmacy or Health Dept.or vaccine clinic. Aware to provide a copy of the vaccination record if obtained from local pharmacy or Health Dept. Verbalized acceptance and understanding.  Qualifies for Shingles Vaccine? Yes   Zostavax completed No   Shingrix Completed?: No.    Education has been provided regarding the importance of this vaccine. Patient has been advised to call insurance company to determine out of pocket expense if they have not yet received this vaccine. Advised may also receive vaccine at local pharmacy or Health Dept. Verbalized acceptance and understanding.  Screening Tests Health Maintenance  Topic Date Due   COVID-19 Vaccine (1) Never done   DTaP/Tdap/Td (1 - Tdap) Never done  Zoster Vaccines- Shingrix (1 of 2) Never done   Pneumonia Vaccine 73+ Years old (1 - PCV) Never done   DEXA SCAN  Never done   INFLUENZA VACCINE  Never done   Hepatitis C Screening  11/17/2022 (Originally 03/16/1963)   Medicare Annual Wellness (AWV)  03/23/2023   HPV VACCINES  Aged Out    Health Maintenance  Health Maintenance Due  Topic Date Due   COVID-19 Vaccine (1) Never done   DTaP/Tdap/Td (1 - Tdap) Never done   Zoster Vaccines- Shingrix (1 of 2) Never done   Pneumonia Vaccine 42+ Years old (1 - PCV) Never done   DEXA SCAN  Never done   INFLUENZA VACCINE  Never done    Colorectal cancer screening: No longer required.   Mammogram status: No longer required  due to age.  Bone density status:  Patient declines at this time   Lung Cancer Screening: (Low Dose CT Chest recommended if Age 14-80 years, 30 pack-year currently smoking OR have quit w/in 15years.) does not qualify.   Lung Cancer Screening Referral: n/a   Additional Screening:  Hepatitis C Screening: does not qualify  Vision Screening: Recommended annual ophthalmology exams for early detection of glaucoma and other disorders of the eye. Is the patient up to date with their annual eye exam?  Yes  Who is the provider or what is the name of the office in which the patient attends annual eye exams? Dr. Jule Ser If pt is not established with a provider, would they like to be referred to a provider to establish care? No .   Dental Screening: Recommended annual dental exams for proper oral hygiene  Community Resource Referral / Chronic Care Management: CRR required this visit?  No   CCM required this visit?  No      Plan:     I have personally reviewed and noted the following in the patient's chart:   Medical and social history Use of alcohol, tobacco or illicit drugs  Current medications and supplements including opioid prescriptions. Patient is not currently taking opioid prescriptions. Functional ability and status Nutritional status Physical activity Advanced directives List of other physicians Hospitalizations, surgeries, and ER visits in previous 12 months Vitals Screenings to include cognitive, depression, and falls Referrals and appointments  In addition, I have reviewed and discussed with patient certain preventive protocols, quality metrics, and best practice recommendations. A written personalized care plan for preventive services as well as general preventive health recommendations were provided to patient.     Vanetta Mulders, [place]   [id number]   Due to this being a virtual visit, the after visit summary with patients personalized plan was offered to  patient via mail or my-chart. Patient would like to access on my-chart  Nurse Notes: No concerns   I have reviewed and agree with the above AWV documentation.   Mila Merry, MSN, APRN, FNP-C [facility] [id number] [place] [address]

## 2022-03-22 NOTE — Patient Instructions (Signed)
Alexis [name] , Thank you for taking time to come for your Medicare Wellness Visit. I appreciate your ongoing commitment to your health goals. Please review the following plan we discussed and let me know if I can assist you in the future.   These are the goals we discussed:  Goals      Maintain Independence        This is a list of the screening recommended for you and due dates:  Health Maintenance  Topic Date Due   COVID-19 Vaccine (1) Never done   DTaP/Tdap/Td vaccine (1 - Tdap) Never done   Zoster (Shingles) Vaccine (1 of 2) Never done   Pneumonia Vaccine (1 - PCV) Never done   DEXA scan (bone density measurement)  Never done   Flu Shot  Never done   Hepatitis C Screening: USPSTF Recommendation to screen - Ages 18-79 yo.  11/17/2022*   Medicare Annual Wellness Visit  03/23/2023   HPV Vaccine  Aged Out  *Topic was postponed. The date shown is not the original due date.    Advanced directives: Forms are available if you choose in the future to pursue completion.  This is recommended in order to make sure that your health wishes are honored in the event that you are unable to verbalize them to the provider.    Conditions/risks identified: Aim for 30 minutes of exercise or brisk walking, 6-8 glasses of water, and 5 servings of fruits and vegetables each day.   Next appointment: Follow up in one year for your annual wellness visit    Preventive Care 65 Years and Older, Female Preventive care refers to lifestyle choices and visits with your health care provider that can promote health and wellness. What does preventive care include? A yearly physical exam. This is also called an annual well check. Dental exams once or twice a year. Routine eye exams. Ask your health care provider how often you should have your eyes checked. Personal lifestyle choices, including: Daily care of your teeth and gums. Regular physical activity. Eating a healthy diet. Avoiding tobacco and drug  use. Limiting alcohol use. Practicing safe sex. Taking low-dose aspirin every day. Taking vitamin and mineral supplements as recommended by your health care provider. What happens during an annual well check? The services and screenings done by your health care provider during your annual well check will depend on your age, overall health, lifestyle risk factors, and family history of disease. Counseling  Your health care provider may ask you questions about your: Alcohol use. Tobacco use. Drug use. Emotional well-being. Home and relationship well-being. Sexual activity. Eating habits. History of falls. Memory and ability to understand (cognition). Work and work Statistician. Reproductive health. Screening  You may have the following tests or measurements: Height, weight, and BMI. Blood pressure. Lipid and cholesterol levels. These may be checked every 5 years, or more frequently if you are over 78 years old. Skin check. Lung cancer screening. You may have this screening every year starting at age 29 if you have a 30-pack-year history of smoking and currently smoke or have quit within the past 15 years. Fecal occult blood test (FOBT) of the stool. You may have this test every year starting at age 38. Flexible sigmoidoscopy or colonoscopy. You may have a sigmoidoscopy every 5 years or a colonoscopy every 10 years starting at age 52. Hepatitis C blood test. Hepatitis B blood test. Sexually transmitted disease (STD) testing. Diabetes screening. This is done by checking your blood sugar (  glucose) after you have not eaten for a while (fasting). You may have this done every 1-3 years. Bone density scan. This is done to screen for osteoporosis. You may have this done starting at age 64. Mammogram. This may be done every 1-2 years. Talk to your health care provider about how often you should have regular mammograms. Talk with your health care provider about your test results, treatment  options, and if necessary, the need for more tests. Vaccines  Your health care provider may recommend certain vaccines, such as: Influenza vaccine. This is recommended every year. Tetanus, diphtheria, and acellular pertussis (Tdap, Td) vaccine. You may need a Td booster every 10 years. Zoster vaccine. You may need this after age 50. Pneumococcal 13-valent conjugate (PCV13) vaccine. One dose is recommended after age 11. Pneumococcal polysaccharide (PPSV23) vaccine. One dose is recommended after age 49. Talk to your health care provider about which screenings and vaccines you need and how often you need them. This information is not intended to replace advice given to you by your health care provider. Make sure you discuss any questions you have with your health care provider. Document Released: 03/13/2015 Document Revised: 11/04/2015 Document Reviewed: 12/16/2014 Elsevier Interactive Patient Education  2017 [place] Prevention in the Home Falls can cause injuries. They can happen to people of all ages. There are many things you can do to make your home safe and to help prevent falls. What can I do on the outside of my home? Regularly fix the edges of walkways and driveways and fix any cracks. Remove anything that might make you trip as you walk through a door, such as a raised step or threshold. Trim any bushes or trees on the path to your home. Use bright outdoor lighting. Clear any walking paths of anything that might make someone trip, such as rocks or tools. Regularly check to see if handrails are loose or broken. Make sure that both sides of any steps have handrails. Any raised decks and porches should have guardrails on the edges. Have any leaves, snow, or ice cleared regularly. Use sand or salt on walking paths during winter. Clean up any spills in your garage right away. This includes oil or grease spills. What can I do in the bathroom? Use night lights. Install grab  bars by the toilet and in the tub and shower. Do not use towel bars as grab bars. Use non-skid mats or decals in the tub or shower. If you need to sit down in the shower, use a plastic, non-slip stool. Keep the floor dry. Clean up any water that spills on the floor as soon as it happens. Remove soap buildup in the tub or shower regularly. Attach bath mats securely with double-sided non-slip rug tape. Do not have throw rugs and other things on the floor that can make you trip. What can I do in the bedroom? Use night lights. Make sure that you have a light by your bed that is easy to reach. Do not use any sheets or blankets that are too big for your bed. They should not hang down onto the floor. Have a firm chair that has side arms. You can use this for support while you get dressed. Do not have throw rugs and other things on the floor that can make you trip. What can I do in the kitchen? Clean up any spills right away. Avoid walking on wet floors. Keep items that you use a lot in easy-to-reach places.  If you need to reach something above you, use a strong step stool that has a grab bar. Keep electrical cords out of the way. Do not use floor polish or wax that makes floors slippery. If you must use wax, use non-skid floor wax. Do not have throw rugs and other things on the floor that can make you trip. What can I do with my stairs? Do not leave any items on the stairs. Make sure that there are handrails on both sides of the stairs and use them. Fix handrails that are broken or loose. Make sure that handrails are as long as the stairways. Check any carpeting to make sure that it is firmly attached to the stairs. Fix any carpet that is loose or worn. Avoid having throw rugs at the top or bottom of the stairs. If you do have throw rugs, attach them to the floor with carpet tape. Make sure that you have a light switch at the top of the stairs and the bottom of the stairs. If you do not have them,  ask someone to add them for you. What else can I do to help prevent falls? Wear shoes that: Do not have high heels. Have rubber bottoms. Are comfortable and fit you well. Are closed at the toe. Do not wear sandals. If you use a stepladder: Make sure that it is fully opened. Do not climb a closed stepladder. Make sure that both sides of the stepladder are locked into place. Ask someone to hold it for you, if possible. Clearly mark and make sure that you can see: Any grab bars or handrails. First and last steps. Where the edge of each step is. Use tools that help you move around (mobility aids) if they are needed. These include: Canes. Walkers. Scooters. Crutches. Turn on the lights when you go into a dark area. Replace any light bulbs as soon as they burn out. Set up your furniture so you have a clear path. Avoid moving your furniture around. If any of your floors are uneven, fix them. If there are any pets around you, be aware of where they are. Review your medicines with your doctor. Some medicines can make you feel dizzy. This can increase your chance of falling. Ask your doctor what other things that you can do to help prevent falls. This information is not intended to replace advice given to you by your health care provider. Make sure you discuss any questions you have with your health care provider. Document Released: 12/11/2008 Document Revised: 07/23/2015 Document Reviewed: 03/21/2014 Elsevier Interactive Patient Education  2017 [employer].

## 2022-03-31 DIAGNOSIS — L2089 Other atopic dermatitis: Secondary | ICD-10-CM | POA: Diagnosis not present

## 2022-04-14 DIAGNOSIS — L2089 Other atopic dermatitis: Secondary | ICD-10-CM | POA: Diagnosis not present

## 2022-04-28 ENCOUNTER — Ambulatory Visit: Payer: Medicare Other | Admitting: Physician Assistant

## 2022-04-28 ENCOUNTER — Ambulatory Visit (HOSPITAL_COMMUNITY)
Admission: RE | Admit: 2022-04-28 | Discharge: 2022-04-28 | Disposition: A | Discharge status: Home / Self Care | Payer: Medicare Other | Source: Ambulatory Visit | Attending: Vascular Surgery | Admitting: Vascular Surgery

## 2022-04-28 VITALS — BP 120/64 | HR 80 | Temp 98.2°F | Resp 20 | Ht 59.0 in | Wt 88.9 lb

## 2022-04-28 DIAGNOSIS — I6523 Occlusion and stenosis of bilateral carotid arteries: Secondary | ICD-10-CM | POA: Diagnosis not present

## 2022-04-28 NOTE — Progress Notes (Signed)
History of Present Illness:  Patient is a 77 y.o. year old female who presents for evaluation of carotid stenosis.  She has a history of asymptomatic right ICA stenosis s/p CEA 07/05/21 by Dr. Carlis Abbott.  The asymptomatic left ICA has 60-79% stenosis with EDV of 72 as of 06/29/21.     The patient denies symptoms of TIA, amaurosis, or stroke.  She is medically managed on ASA, Lipitor and Plavix.       Past Medical History:  Diagnosis Date   Anemia    Anxiety    Arthritis    Asthma    Claudication ([place])    stent in bil iliac 11/25/10   Coronary artery disease    GERD (gastroesophageal reflux disease)    HTN (hypertension)    Hyperlipemia    Hypothyroidism    Murmur, cardiac    echo 10/12/10- EF >55%, mod sclerotic aortic valve   PONV (postoperative nausea and vomiting)    PVD (peripheral vascular disease) (HCC)    lower ext and upper ext-L subclavian stenosis by ultrasound, nl nuclear test- 10/27/10   Tobacco abuse    Vitamin D deficiency     Past Surgical History:  Procedure Laterality Date   CORONARY STENT INTERVENTION N/A 06/03/2020   Procedure: CORONARY STENT INTERVENTION;  Surgeon: Wellington Hampshire, MD;  Location: [facility];  Service: Cardiovascular;  Laterality: N/A;   ENDARTERECTOMY Right 07/05/2021   Procedure: RIGHT ENDARTERECTOMY CAROTID;  Surgeon: Marty Heck, MD;  Location: [facility];  Service: Vascular;  Laterality: Right;   INTRAVASCULAR LITHOTRIPSY  06/03/2020   Procedure: INTRAVASCULAR LITHOTRIPSY;  Surgeon: Wellington Hampshire, MD;  Location: [facility];  Service: Cardiovascular;;   INTRAVASCULAR PRESSURE WIRE/FFR STUDY N/A 05/21/2020   Procedure: INTRAVASCULAR PRESSURE WIRE/FFR STUDY;  Surgeon: Lorretta Harp, MD;  Location: [facility];  Service: Cardiovascular;  Laterality: N/A;   KNEE SURGERY     LEFT HEART CATH AND CORONARY ANGIOGRAPHY N/A 05/21/2020   Procedure: LEFT HEART CATH AND CORONARY ANGIOGRAPHY;  Surgeon: Lorretta Harp, MD;   Location: [facility];  Service: Cardiovascular;  Laterality: N/A;   OPERATIVE ULTRASOUND  07/05/2021   Procedure: OPERATIVE ULTRASOUND;  Surgeon: Marty Heck, MD;  Location: [facility];  Service: Vascular;;   PATCH ANGIOPLASTY Right 07/05/2021   Procedure: PATCH ANGIOPLASTY;  Surgeon: Marty Heck, MD;  Location: [facility];  Service: Vascular;  Laterality: Right;   peripheral angio  11/25/10   right iliac-6x27 Express balloon expandable stent; Left iliac 6x37   TONSILLECTOMY       Social History Social History   Tobacco Use   Smoking status: Former    Types: Cigarettes    Quit date: 02/28/2002    Years since quitting: 20.1    Passive exposure: Never   Smokeless tobacco: Never  Vaping Use   Vaping Use: Never used  Substance Use Topics   Alcohol use: No   Drug use: No    Family History Family History  Problem Relation Age of Onset   Allergic rhinitis Father    Allergic rhinitis Maternal Grandmother    Urticaria Maternal Grandmother    Asthma Maternal Grandfather    Allergic rhinitis Paternal Grandmother    Angioedema Neg Hx    Eczema Neg Hx    Immunodeficiency Neg Hx     Allergies  Allergies  Allergen Reactions   Demerol [Meperidine] Nausea And Vomiting   Metoprolol     asthma  Milk-Related Compounds Other (See Comments)    REFLUX (CHEESE)   Oatmeal Hives   Pineapple Other (See Comments)    Burning in mouth   Penicillins Rash     Current Outpatient Medications  Medication Sig Dispense Refill   albuterol (VENTOLIN HFA) 108 (90 Base) MCG/ACT inhaler Inhale 2 puffs into the lungs every 6 (six) hours as needed for wheezing or shortness of breath. 18 g 2   aspirin EC 81 MG tablet Take 81 mg by mouth in the morning. Swallow whole.     atorvastatin (LIPITOR) 80 MG tablet Take 1 tablet (80 mg total) by mouth daily. 90 tablet 2   benazepril (LOTENSIN) 40 MG tablet Take 1 tablet (40 mg total) by mouth daily. 90 tablet 0   Cholecalciferol (VITAMIN D3) 50  MCG (2000 UT) TABS Take 2,000 Units by mouth in the morning.     citalopram (CELEXA) 40 MG tablet Take 0.5 tablets (20 mg total) by mouth daily. 90 tablet 1   clopidogrel (PLAVIX) 75 MG tablet Take 1 tablet (75 mg total) by mouth daily with breakfast. 90 tablet 3   diphenhydrAMINE (BENADRYL) 25 MG tablet Take 25-50 mg by mouth every 6 (six) hours as needed for itching (ECZEMA).     DUPIXENT 300 MG/2ML SOPN Inject into the skin.     ferrous sulfate 324 MG TBEC Take 324 mg by mouth daily with breakfast.     levothyroxine (SYNTHROID) 75 MCG tablet TAKE 1 TABLET BY MOUTH EVERY MORNING BEFORE BREAKFAST 90 tablet 1   Multiple Vitamins-Minerals (PRESERVISION AREDS 2) CAPS Take 1 capsule by mouth once a week.     oxyCODONE-acetaminophen (PERCOCET/ROXICET) 5-325 MG tablet Take 1 tablet by mouth every 6 (six) hours as needed for moderate pain. 12 tablet 0   triamcinolone cream (KENALOG) 0.1 % Apply 1 application topically 2 (two) times daily. (Patient taking differently: Apply 1 application  topically 2 (two) times daily as needed (ECZEMA).) 30 g 3   nitroGLYCERIN (NITROSTAT) 0.4 MG SL tablet Place 1 tablet (0.4 mg total) under the tongue every 5 (five) minutes as needed for chest pain. 25 tablet 3   No current facility-administered medications for this visit.    ROS:   General:  No weight loss, Fever, chills  HEENT: No recent headaches, no nasal bleeding, no visual changes, no sore throat  Neurologic: No dizziness, blackouts, seizures. No recent symptoms of stroke or mini- stroke. No recent episodes of slurred speech, or temporary blindness.  Cardiac: No recent episodes of chest pain/pressure, no shortness of breath at rest.  No shortness of breath with exertion.  Denies history of atrial fibrillation or irregular heartbeat  Vascular: No history of rest pain in feet.  No history of claudication.  No history of non-healing ulcer, No history of DVT   Pulmonary: No home oxygen, no productive cough, no  hemoptysis,  No asthma or wheezing  Musculoskeletal:  '[ ]'$  Arthritis, '[ ]'$  Low back pain,  '[ ]'$  Joint pain  Hematologic:No history of hypercoagulable state.  No history of easy bleeding.  No history of anemia  Gastrointestinal: No hematochezia or melena,  No gastroesophageal reflux, no trouble swallowing  Urinary: '[ ]'$  chronic Kidney disease, '[ ]'$  on HD - '[ ]'$  MWF or '[ ]'$  TTHS, '[ ]'$  Burning with urination, '[ ]'$  Frequent urination, '[ ]'$  Difficulty urinating;   Skin: No rashes  Psychological: No history of anxiety,  No history of depression   Physical Examination  Vitals:   04/28/22 [id number]  04/28/22 0933  BP: (!) 153/65 120/64  Pulse: 80   Resp: 20   Temp: 98.2 F (36.8 C)   TempSrc: Temporal   Weight: 88 lb 14.4 oz (40.3 kg)   Height: '4\' 11"'$  (1.499 m)     Body mass index is 17.96 kg/m.  General:  Alert and oriented, no acute distress HEENT: Normal Neck: No bruit or JVD Pulmonary: Clear to auscultation bilaterally Cardiac: Regular Rate and Rhythm without murmur Gastrointestinal: Soft, non-tender, non-distended, no mass, no scars Skin: No rash Extremity Pulses:  2+ radial pulses bilaterally Musculoskeletal: No deformity or edema  Neurologic: Upper and lower extremity motor 5/5 and symmetric  DATA:  Right Carotid Findings:  +----------+--------+--------+--------+------------------+--------+           PSV cm/sEDV cm/sStenosisPlaque DescriptionComments  +----------+--------+--------+--------+------------------+--------+  CCA Prox  26                      heterogenous                +----------+--------+--------+--------+------------------+--------+  CCA Distal28                      heterogenous                +----------+--------+--------+--------+------------------+--------+  ICA Prox  178     28      1-39%   heterogenous                +----------+--------+--------+--------+------------------+--------+  ICA Mid   141     24                                           +----------+--------+--------+--------+------------------+--------+  ICA Distal80      13                                          +----------+--------+--------+--------+------------------+--------+  ECA      230                                                 +----------+--------+--------+--------+------------------+--------+   +----------+--------+-------+--------+-------------------+           PSV cm/sEDV cmsDescribeArm Pressure (mmHG)  +----------+--------+-------+--------+-------------------+  [id number]                   168                  +----------+--------+-------+--------+-------------------+   +---------+--------+---+--------+--+---------+  VertebralPSV cm/s143EDV cm/s21Antegrade  +---------+--------+---+--------+--+---------+      Left Carotid Findings:  +----------+--------+--------+--------+-------------------------+--------+           PSV cm/sEDV cm/sStenosisPlaque Description       Comments  +----------+--------+--------+--------+-------------------------+--------+  CCA Prox  82      19              heterogenous                       +----------+--------+--------+--------+-------------------------+--------+  CCA Distal63      21              heterogenous                       +----------+--------+--------+--------+-------------------------+--------+  ICA Prox  405     96      60-79%  heterogenous and calcific          +----------+--------+--------+--------+-------------------------+--------+  ICA Mid   198     40                                                 +----------+--------+--------+--------+-------------------------+--------+  ICA Distal114     36                                                 +----------+--------+--------+--------+-------------------------+--------+  ECA      761                                                         +----------+--------+--------+--------+-------------------------+--------+   +----------+--------+--------+--------+-------------------+           PSV cm/sEDV cm/sDescribeArm Pressure (mmHG)  +----------+--------+--------+--------+-------------------+  [id number]                     107                  +----------+--------+--------+--------+-------------------+   +---------+--------+--+--------+----------+  VertebralPSV cm/s69EDV cm/sRetrograde  +---------+--------+--+--------+----------+         Summary:  Right Carotid: Velocities in the right ICA are consistent with a 1-39%  stenosis.   Left Carotid: Velocities in the left ICA are consistent with a 60-79%  stenosis.   Vertebrals: Right vertebral artery demonstrates antegrade flow. Left  vertebral             artery demonstrates retrograde flow.    ASSESSMENT/PLAN: Patient is a 77 y.o. year old female who presents for evaluation of carotid stenosis.  She has a history of asymptomatic right ICA stenosis s/p CEA 07/05/21 by Dr. Carlis Abbott.  The asymptomatic left ICA has 60-79% stenosis with EDV of 72 as of 06/29/21.    The right ICA remains < 39% without re stenosis.  The left ICA has increased stenosis from 72 to 96 EDV compared to 6 months ago.  We will have her f/u in 6 months for repeat duplex.  If the ICA > 80% she will get a CTA and f/u with MD to discuss her options.  If she has symptoms of stroke she will call 911.         Roxy Horseman PA-C [facility] Office: [phone]  MD in the [place]

## 2022-05-09 ENCOUNTER — Other Ambulatory Visit: Payer: Self-pay | Admitting: Family Medicine

## 2022-05-17 ENCOUNTER — Other Ambulatory Visit: Payer: Self-pay | Admitting: Family Medicine

## 2022-05-17 DIAGNOSIS — E039 Hypothyroidism, unspecified: Secondary | ICD-10-CM

## 2022-05-31 ENCOUNTER — Other Ambulatory Visit: Payer: Self-pay | Admitting: Family Medicine

## 2022-05-31 DIAGNOSIS — I10 Essential (primary) hypertension: Secondary | ICD-10-CM

## 2022-06-03 DIAGNOSIS — H353231 Exudative age-related macular degeneration, bilateral, with active choroidal neovascularization: Secondary | ICD-10-CM | POA: Diagnosis not present

## 2022-07-14 DIAGNOSIS — L2089 Other atopic dermatitis: Secondary | ICD-10-CM | POA: Diagnosis not present

## 2022-08-23 ENCOUNTER — Other Ambulatory Visit (HOSPITAL_BASED_OUTPATIENT_CLINIC_OR_DEPARTMENT_OTHER): Payer: Self-pay | Admitting: Cardiology

## 2022-08-23 NOTE — Telephone Encounter (Signed)
Please call pt to schedule overdue follow-up appointment with Dr. Christopher for refills. Thank you! 

## 2022-08-24 ENCOUNTER — Telehealth: Payer: Self-pay | Admitting: Cardiology

## 2022-08-24 MED ORDER — CLOPIDOGREL BISULFATE 75 MG PO TABS: 75.0000 mg | ORAL_TABLET | Freq: Every day | ORAL | 0 refills | Status: DC

## 2022-08-24 NOTE — Telephone Encounter (Signed)
Rx request sent to pharmacy.  

## 2022-08-24 NOTE — Telephone Encounter (Signed)
Left message for patient to call and schedule overdue appointment with Dr. Christopher / APP for medication refills 

## 2022-08-24 NOTE — Telephone Encounter (Signed)
*  STAT* If patient is at the pharmacy, call can be transferred to refill team.   1. Which medications need to be refilled? (please list name of each medication and dose if known) clopidogrel (PLAVIX) 75 MG table  2. Which pharmacy/location (including street and city if local pharmacy) is medication to be sent to? [facility] - [place], [place] - [address]  3. Do they need a 30 day or 90 day supply?  90 day supply  Patient states she is going out of town Friday and will run out of medication while she is out of town.

## 2022-08-29 NOTE — Telephone Encounter (Signed)
Patient is scheduled 10/21/22 with Dr. Cristal Deer

## 2022-08-30 ENCOUNTER — Encounter (HOSPITAL_BASED_OUTPATIENT_CLINIC_OR_DEPARTMENT_OTHER): Payer: Self-pay

## 2022-08-30 ENCOUNTER — Telehealth (HOSPITAL_BASED_OUTPATIENT_CLINIC_OR_DEPARTMENT_OTHER): Payer: Self-pay | Admitting: Cardiology

## 2022-08-30 NOTE — Telephone Encounter (Signed)
Called patient to inform her Dr. Cristal Deer os out of the office on 10/21/22---call air dropped---called again and call would not connect---will rescheduled appointment to Friday 10/28/22 at 11:20 am.  Will send My Chart message and mail information to patient

## 2022-09-05 DIAGNOSIS — H353133 Nonexudative age-related macular degeneration, bilateral, advanced atrophic without subfoveal involvement: Secondary | ICD-10-CM | POA: Diagnosis not present

## 2022-09-05 DIAGNOSIS — H43813 Vitreous degeneration, bilateral: Secondary | ICD-10-CM | POA: Diagnosis not present

## 2022-09-05 DIAGNOSIS — H35033 Hypertensive retinopathy, bilateral: Secondary | ICD-10-CM | POA: Diagnosis not present

## 2022-09-05 DIAGNOSIS — H353231 Exudative age-related macular degeneration, bilateral, with active choroidal neovascularization: Secondary | ICD-10-CM | POA: Diagnosis not present

## 2022-10-18 DIAGNOSIS — L2089 Other atopic dermatitis: Secondary | ICD-10-CM | POA: Diagnosis not present

## 2022-10-21 ENCOUNTER — Ambulatory Visit (HOSPITAL_BASED_OUTPATIENT_CLINIC_OR_DEPARTMENT_OTHER): Payer: Medicare Other | Admitting: Cardiology

## 2022-10-26 ENCOUNTER — Other Ambulatory Visit: Payer: Self-pay | Admitting: *Deleted

## 2022-10-26 DIAGNOSIS — I6523 Occlusion and stenosis of bilateral carotid arteries: Secondary | ICD-10-CM

## 2022-10-28 ENCOUNTER — Encounter (HOSPITAL_BASED_OUTPATIENT_CLINIC_OR_DEPARTMENT_OTHER): Payer: Self-pay | Admitting: Cardiology

## 2022-10-28 ENCOUNTER — Ambulatory Visit (HOSPITAL_BASED_OUTPATIENT_CLINIC_OR_DEPARTMENT_OTHER): Payer: Medicare Other | Admitting: Cardiology

## 2022-10-28 VITALS — BP 134/82 | HR 73 | Ht 59.0 in | Wt 86.3 lb

## 2022-10-28 DIAGNOSIS — I771 Stricture of artery: Secondary | ICD-10-CM | POA: Diagnosis not present

## 2022-10-28 DIAGNOSIS — I6523 Occlusion and stenosis of bilateral carotid arteries: Secondary | ICD-10-CM

## 2022-10-28 DIAGNOSIS — I2510 Atherosclerotic heart disease of native coronary artery without angina pectoris: Secondary | ICD-10-CM

## 2022-10-28 DIAGNOSIS — I739 Peripheral vascular disease, unspecified: Secondary | ICD-10-CM | POA: Diagnosis not present

## 2022-10-28 DIAGNOSIS — I10 Essential (primary) hypertension: Secondary | ICD-10-CM

## 2022-10-28 DIAGNOSIS — E7800 Pure hypercholesterolemia, unspecified: Secondary | ICD-10-CM

## 2022-10-28 NOTE — Progress Notes (Signed)
Cardiology Office Note:  .    Date:  10/28/2022  ID:  Alexis [name], DOB [birth year], MRN [id number] PCP: Park Meo, FNP   HeartCare Providers Cardiologist:  Jodelle Red, MD     History of Present Illness: .    Alexis [name] is a 77 y.o. female with a hx of CAD with complex PCI of RCA 05/2020, PAD (carotid artery disease, subclavian artery stenosis, claudication), hypertension, asthma, hyperlipidemia, who is seen for follow up today. I initially met her 05/09/19 as a new consult at the request of Valentino Nose, NP for the evaluation and management of carotid bruit. She was last seen by Dr. Allyson Sabal in 2015.   At her visit 04/2021 she was feeling okay aside from new cat allergies. Her blood pressure was 138/74 in the office; she wasn't monitoring at home. The night prior she had chest pain that radiated deep towards her back. She was uncertain of the duration as she woke up in pain. She had pleuritic pain but was not short of breath. No medication changes were made.  Today, she states that her brother passed away recently. Also reports that her vision has been worsening due to her macular degeneration. Otherwise she appears to be stable from a cardiovascular perspective. Her blood pressure is stable at 134/82 in the office.  For activity, she recently swept the carport and the front porch. No anginal symptoms.  She denies any palpitations, chest pain, shortness of breath, peripheral edema, lightheadedness, headaches, syncope, orthopnea, or PND.  ROS:  Please see the history of present illness. ROS otherwise negative except as noted.   Studies Reviewed: Marland Kitchen    EKG Interpretation Date/Time:  Friday October 28 2022 11:34:22 EDT Ventricular Rate:  73 PR Interval:  124 QRS Duration:  84 QT Interval:  470 QTC Calculation: 517 R Axis:   61  Text Interpretation: Normal sinus rhythm Possible Left atrial enlargement Confirmed by Jodelle Red [phone]) on  10/28/2022 11:52:58 AM    Bilateral Carotid Dopplers  04/28/2022: Summary:  Right Carotid: Velocities in the right ICA are consistent with a 1-39% stenosis.   Left Carotid: Velocities in the left ICA are consistent with a 60-79% stenosis.   Vertebrals: Right vertebral artery demonstrates antegrade flow. Left vertebral artery demonstrates retrograde flow.   Physical Exam:    VS:  BP 134/82   Pulse 73   Ht 4\' 11"  (1.499 m)   Wt 86 lb 4.8 oz (39.1 kg)   BMI 17.43 kg/m    Wt Readings from Last 3 Encounters:  10/28/22 86 lb 4.8 oz (39.1 kg)  04/28/22 88 lb 14.4 oz (40.3 kg)  03/22/22 87 lb (39.5 kg)    GEN: Well nourished, well developed in no acute distress HEENT: Normal, moist mucous membranes NECK: No JVD CARDIAC: regular rhythm, normal S1 and S2, no rubs or gallops. 1/6 systolic murmur. VASCULAR: Radial and DP pulses 2+ bilaterally. Bilateral carotid bruits RESPIRATORY:  Clear to auscultation without rales, wheezing or rhonchi  ABDOMEN: Soft, non-tender, non-distended MUSCULOSKELETAL:  Ambulates independently SKIN: Warm and dry, no edema NEUROLOGIC:  Alert and oriented x 3. No focal neuro deficits noted. PSYCHIATRIC:  Normal affect   ASSESSMENT AND PLAN: .    Obstructive CAD, s/p complex PCI to RCA 05/2020 -no chest pain recently -does have some chronic bruising, but not bothersome -continue DAPT at least 12 months, given complex ostial RCA stenosis, ideally lifelong if she can tolerate. She is amenable to continuing DAPT today  PAD: carotid disease, subclavian stenosis, history of claudication s/p bilateral iliac stenting 2012 -denies any neurologic symptoms, but has progressive carotid stenosis based on imaging studies. Following with vascular surgery -bilateral carotid bruits -continue aspirin, statin -counseled on red flag warning signs that need immediate medical attention -discussed risk factor management   Hypertension: goal <130/80, near goal -has subclavian  disease, confounds BP measurements -continue benazepril 40 mg -will not make changes today   Hyperlipidemia: LDL goal <70 given PAD -last LDL 40 -continue antiplatelet, atorvastatin 80 mg daily   Cardiac risk counseling and prevention recommendations: -recommend heart healthy/Mediterranean diet, with whole grains, fruits, vegetable, fish, lean meats, nuts, and olive oil. Limit salt. -recommend moderate walking, 3-5 times/week for 30-50 minutes each session. Aim for at least 150 minutes.week. Goal should be pace of 3 miles/hours, or walking 1.5 miles in 30 minutes -recommend avoidance of tobacco products. Avoid excess alcohol.  Dispo: Follow-up in 1 year, or sooner as needed.  I,Mathew Stumpf,acting as a Neurosurgeon for [employer], MD.,have documented all relevant documentation on the behalf of Jodelle Red, MD,as directed by  Jodelle Red, MD while in the presence of Jodelle Red, MD.  I, Jodelle Red, MD, have reviewed all documentation for this visit. The documentation on 10/28/22 for the exam, diagnosis, procedures, and orders are all accurate and complete.   Signed, Jodelle Red, MD

## 2022-10-28 NOTE — Patient Instructions (Signed)
Medication Instructions:  No change today *If you need a refill on your cardiac medications before your next appointment, please call your pharmacy*   Lab Work: None If you have labs (blood work) drawn today and your tests are completely normal, you will receive your results only by: MyChart Message (if you have MyChart) OR A paper copy in the mail If you have any lab test that is abnormal or we need to change your treatment, we will call you to review the results.   Testing/Procedures: None   Follow-Up: At [facility], you and your health needs are our priority.  As part of our continuing mission to provide you with exceptional heart care, we have created designated Provider Care Teams.  These Care Teams include your primary Cardiologist (physician) and Advanced Practice Providers (APPs -  Physician Assistants and Nurse Practitioners) who all work together to provide you with the care you need, when you need it.  We recommend signing up for the patient portal called "MyChart".  Sign up information is provided on this After Visit Summary.  MyChart is used to connect with patients for Virtual Visits (Telemedicine).  Patients are able to view lab/test results, encounter notes, upcoming appointments, etc.  Non-urgent messages can be sent to your provider as well.   To learn more about what you can do with MyChart, go to [email or web address].    Your next appointment:   1 year(s)  Provider:   Jodelle Red, MD    Other Instructions Call if you need [place]!

## 2022-11-05 ENCOUNTER — Other Ambulatory Visit: Payer: Self-pay | Admitting: Family Medicine

## 2022-11-05 DIAGNOSIS — E039 Hypothyroidism, unspecified: Secondary | ICD-10-CM

## 2022-11-08 ENCOUNTER — Encounter: Payer: Self-pay | Admitting: Physician Assistant

## 2022-11-08 ENCOUNTER — Ambulatory Visit (HOSPITAL_COMMUNITY)
Admission: RE | Admit: 2022-11-08 | Discharge: 2022-11-08 | Disposition: A | Discharge status: Home / Self Care | Payer: Medicare Other | Source: Ambulatory Visit | Attending: Vascular Surgery | Admitting: Vascular Surgery

## 2022-11-08 ENCOUNTER — Ambulatory Visit (INDEPENDENT_AMBULATORY_CARE_PROVIDER_SITE_OTHER): Payer: Medicare Other | Admitting: Physician Assistant

## 2022-11-08 VITALS — BP 131/78 | HR 61 | Temp 98.1°F | Resp 18 | Ht 59.0 in | Wt 85.8 lb

## 2022-11-08 DIAGNOSIS — I6523 Occlusion and stenosis of bilateral carotid arteries: Secondary | ICD-10-CM | POA: Diagnosis not present

## 2022-11-08 NOTE — Progress Notes (Signed)
HISTORY AND PHYSICAL     CC:  follow up. Requesting Provider:  Park Meo, FNP  HPI: This is a 77 y.o. female here for follow up for carotid artery stenosis.  Pt is s/p right CEA for asymptomatic carotid artery stenosis on 07/05/2021 by Dr. Chestine Spore.    Pt was last seen 04/28/2022 and at that time she had 60-79% asymptomatic left ICA stenosis.   Pt returns today for follow up.    Pt denies speech difficulties, weakness, numbness, paralysis or clumsiness or facial droop.  She states she has had some lateral vision loss of the left eye for about 4 months.  She contributes this to macular degeneration, which she gets injections in both eyes for this.  She states she is going to have a more comprehensive eye exam in October.   She denies any claudication or non healing wounds.  She does take dupixent injections for her eczema.  She states that she has bad knees.  She has hx of cardiac stents with Dr. Kirke Corin.  She is on levothyroxine for hypothyroidism.    The pt is on a statin for cholesterol management.  The pt is on a daily aspirin.   Other AC:  plavix The pt is on ACEI for hypertension.   The pt is not on medication for diabetes Tobacco hx:  former  Pt does not have family hx of AAA.  Past Medical History:  Diagnosis Date   Anemia    Anxiety    Arthritis    Asthma    Claudication (HCC)    stent in bil iliac 11/25/10   Coronary artery disease    GERD (gastroesophageal reflux disease)    HTN (hypertension)    Hyperlipemia    Hypothyroidism    Murmur, cardiac    echo 10/12/10- EF >55%, mod sclerotic aortic valve   PONV (postoperative nausea and vomiting)    PVD (peripheral vascular disease) (HCC)    lower ext and upper ext-L subclavian stenosis by ultrasound, nl nuclear test- 10/27/10   Tobacco abuse    Vitamin D deficiency     Past Surgical History:  Procedure Laterality Date   CORONARY PRESSURE/FFR STUDY N/A 05/21/2020   Procedure: INTRAVASCULAR PRESSURE WIRE/FFR STUDY;   Surgeon: Runell Gess, MD;  Location: [facility];  Service: Cardiovascular;  Laterality: N/A;   CORONARY STENT INTERVENTION N/A 06/03/2020   Procedure: CORONARY STENT INTERVENTION;  Surgeon: Iran Ouch, MD;  Location: [facility];  Service: Cardiovascular;  Laterality: N/A;   ENDARTERECTOMY Right 07/05/2021   Procedure: RIGHT ENDARTERECTOMY CAROTID;  Surgeon: Cephus Shelling, MD;  Location: [facility] OR;  Service: Vascular;  Laterality: Right;   KNEE SURGERY     LEFT HEART CATH AND CORONARY ANGIOGRAPHY N/A 05/21/2020   Procedure: LEFT HEART CATH AND CORONARY ANGIOGRAPHY;  Surgeon: Runell Gess, MD;  Location: [facility];  Service: Cardiovascular;  Laterality: N/A;   OPERATIVE ULTRASOUND  07/05/2021   Procedure: OPERATIVE ULTRASOUND;  Surgeon: Cephus Shelling, MD;  Location: [facility] OR;  Service: Vascular;;   PATCH ANGIOPLASTY Right 07/05/2021   Procedure: PATCH ANGIOPLASTY;  Surgeon: Cephus Shelling, MD;  Location: [facility] OR;  Service: Vascular;  Laterality: Right;   peripheral angio  11/25/10   right iliac-6x27 Express balloon expandable stent; Left iliac 6x37   PERIPHERAL INTRAVASCULAR LITHOTRIPSY  06/03/2020   Procedure: INTRAVASCULAR LITHOTRIPSY;  Surgeon: Iran Ouch, MD;  Location: [facility];  Service: Cardiovascular;;  TONSILLECTOMY      Allergies  Allergen Reactions   Demerol [Meperidine] Nausea And Vomiting   Metoprolol     asthma   Oatmeal Hives   Penicillins Rash    Current Outpatient Medications  Medication Sig Dispense Refill   albuterol (VENTOLIN HFA) 108 (90 Base) MCG/ACT inhaler Inhale 2 puffs into the lungs every 6 (six) hours as needed for wheezing or shortness of breath. 18 g 2   aspirin EC 81 MG tablet Take 81 mg by mouth in the morning. Swallow whole.     atorvastatin (LIPITOR) 80 MG tablet Take 1 tablet (80 mg total) by mouth daily. 90 tablet 2   benazepril (LOTENSIN) 40 MG tablet TAKE ONE TABLET BY MOUTH ONCE A DAY 90  tablet 1   Cholecalciferol (VITAMIN D3) 50 MCG (2000 UT) TABS Take 2,000 Units by mouth in the morning.     citalopram (CELEXA) 40 MG tablet Take 0.5 tablets (20 mg total) by mouth daily. 90 tablet 1   clopidogrel (PLAVIX) 75 MG tablet Take 1 tablet (75 mg total) by mouth daily with breakfast. 90 tablet 0   diphenhydrAMINE (BENADRYL) 25 MG tablet Take 25-50 mg by mouth every 6 (six) hours as needed for itching (ECZEMA).     DUPIXENT 300 MG/2ML SOPN Inject into the skin.     ferrous sulfate 324 MG TBEC Take 324 mg by mouth daily with breakfast.     levothyroxine (SYNTHROID) 75 MCG tablet TAKE ONE TABLET BY MOUTH EVERY MORNING BEFORE BREAKFAST 30 tablet 0   Multiple Vitamins-Minerals (PRESERVISION AREDS 2) CAPS Take 1 capsule by mouth once a week.     nitroGLYCERIN (NITROSTAT) 0.4 MG SL tablet Place 1 tablet (0.4 mg total) under the tongue every 5 (five) minutes as needed for chest pain. 25 tablet 3   oxyCODONE-acetaminophen (PERCOCET/ROXICET) 5-325 MG tablet Take 1 tablet by mouth every 6 (six) hours as needed for moderate pain. 12 tablet 0   pantoprazole (PROTONIX) 40 MG tablet TAKE ONE TABLET BY MOUTH ONCE DAILY 90 tablet 1   triamcinolone cream (KENALOG) 0.1 % Apply 1 application topically 2 (two) times daily. (Patient taking differently: Apply 1 application  topically 2 (two) times daily as needed (ECZEMA).) 30 g 3   No current facility-administered medications for this visit.    Family History  Problem Relation Age of Onset   Allergic rhinitis Father    Allergic rhinitis Maternal Grandmother    Urticaria Maternal Grandmother    Asthma Maternal Grandfather    Allergic rhinitis Paternal Grandmother    Angioedema Neg Hx    Eczema Neg Hx    Immunodeficiency Neg Hx     Social History   Socioeconomic History   Marital status: Married    Spouse name: Not on file   Number of children: Not on file   Years of education: Not on file   Highest education level: Not on file  Occupational  History   Not on file  Tobacco Use   Smoking status: Former    Current packs/day: 0.00    Types: Cigarettes    Quit date: 02/28/2002    Years since quitting: 20.7    Passive exposure: Never   Smokeless tobacco: Never  Vaping Use   Vaping status: Never Used  Substance and Sexual Activity   Alcohol use: No   Drug use: No   Sexual activity: Not on file  Other Topics Concern   Not on file  Social History Narrative   Not on  file   Social Determinants of Health   Financial Resource Strain: Low Risk  (03/22/2022)   Overall Financial Resource Strain (CARDIA)    Difficulty of Paying Living Expenses: Not hard at all  Food Insecurity: No Food Insecurity (03/22/2022)   Hunger Vital Sign    Worried About Running Out of Food in the Last Year: Never true    Ran Out of Food in the Last Year: Never true  Transportation Needs: No Transportation Needs (03/22/2022)   PRAPARE - Administrator, Civil Service (Medical): No    Lack of Transportation (Non-Medical): No  Physical Activity: Unknown (03/22/2022)   Exercise Vital Sign    Days of Exercise per Week: 0 days    Minutes of Exercise per Session: Not on file  Stress: No Stress Concern Present (03/22/2022)   [employer] of Occupational Health - Occupational Stress Questionnaire    Feeling of Stress : Not at all  Social Connections: Socially Integrated (03/22/2022)   Social Connection and Isolation Panel [NHANES]    Frequency of Communication with Friends and Family: More than three times a week    Frequency of Social Gatherings with Friends and Family: Three times a week    Attends Religious Services: More than 4 times per year    Active Member of Clubs or Organizations: Yes    Attends Banker Meetings: More than 4 times per year    Marital Status: Married  Catering manager Violence: Not At Risk (03/22/2022)   Humiliation, Afraid, Rape, and Kick questionnaire    Fear of Current or Ex-Partner: No    Emotionally  Abused: No    Physically Abused: No    Sexually Abused: No     REVIEW OF SYSTEMS:   [X]  denotes positive finding, [ ]  denotes negative finding Cardiac  Comments:  Chest pain or chest pressure:    Shortness of breath upon exertion:    Short of breath when lying flat:    Irregular heart rhythm:        Vascular    Pain in calf, thigh, or hip brought on by ambulation:    Pain in feet at night that wakes you up from your sleep:     Blood clot in your veins:    Leg swelling:         Pulmonary    Oxygen at home:    Productive cough:     Wheezing:         Neurologic    Sudden weakness in arms or legs:     Sudden numbness in arms or legs:     Sudden onset of difficulty speaking or slurred speech:    Vision loss in one eye:  x See HPI  Problems with dizziness:         Gastrointestinal    Blood in stool:     Vomited blood:         Genitourinary    Burning when urinating:     Blood in urine:        Psychiatric    Major depression:         Hematologic    Bleeding problems:    Problems with blood clotting too easily:        Skin    Rashes or ulcers:        Constitutional    Fever or chills:      PHYSICAL EXAMINATION:  Today's Vitals   11/08/22 0920 11/08/22 0922  BP: Marland Kitchen)  180/77 131/78  Pulse: 61   Resp: 18   Temp: 98.1 F (36.7 C)   TempSrc: Temporal   SpO2: 92%   Weight: 85 lb 12.8 oz (38.9 kg)   Height: 4\' 11"  (1.499 m)    Body mass index is 17.33 kg/m.   General:  WDWN in NAD; vital signs documented above Gait: Not observed HENT: WNL, normocephalic Pulmonary: normal non-labored breathing Cardiac: regular HR, with carotid bruits bilaterally with right > left Abdomen: soft, NT; aortic pulse is not palpable Skin: without rashes Vascular Exam/Pulses:  Right Left  Radial 2+ (normal) 2+ (normal)   Extremities: without open wounds Musculoskeletal: no muscle wasting or atrophy  Neurologic: A&O X 3; moving all extremities equally; speech is  fluent/normal Psychiatric:  The pt has Normal affect.   Non-Invasive Vascular Imaging:   Carotid Duplex on 11/08/2022 Right:  1-39% ICA stenosis Left:  80-99% ICA stenosis   Previous Carotid duplex on 04/28/2022: Right: 1-39% ICA stenosis Left:   60-79% ICA stenosis    ASSESSMENT/PLAN:: 77 y.o. female here for follow up carotid artery stenosis and hx of right CEA for asymptomatic carotid artery stenosis on 07/05/2021 by Dr. Chestine Spore.    -duplex today reveals an increase in stenosis to 80-99% in the left ICA stenosis.   The study was difficult for technologist.  She has had lateral vision loss of the left eye for about 4 months that she contributes to macular degeneration.  Discussed with pt and will have her return in 2-4 weeks with CTA head and neck and f/u with Dr. Chestine Spore for further discussions and recommendations.   -discussed s/s of stroke with pt and she understands should she develop any of these sx, she will go to the nearest ER or call 911. -pt will call sooner should she have any issues. -continue statin/asa/Plavix   Doreatha Massed, [facility] [phone]  Clinic MD:  Chestine Spore

## 2022-11-11 ENCOUNTER — Other Ambulatory Visit: Payer: Self-pay

## 2022-11-11 DIAGNOSIS — I6523 Occlusion and stenosis of bilateral carotid arteries: Secondary | ICD-10-CM

## 2022-11-14 ENCOUNTER — Other Ambulatory Visit (HOSPITAL_BASED_OUTPATIENT_CLINIC_OR_DEPARTMENT_OTHER): Payer: Self-pay | Admitting: Cardiology

## 2022-11-14 ENCOUNTER — Other Ambulatory Visit: Payer: Self-pay | Admitting: Family Medicine

## 2022-11-15 ENCOUNTER — Other Ambulatory Visit: Payer: Self-pay | Admitting: Family Medicine

## 2022-11-15 DIAGNOSIS — I10 Essential (primary) hypertension: Secondary | ICD-10-CM

## 2022-11-16 NOTE — Telephone Encounter (Signed)
Requested medication (s) are due for refill today: yes   Requested medication (s) are on the active medication list: yes   Last refill:  05/31/22 #90 1 refills   Future visit scheduled: no  Notes to clinic:  last OV 03/22/22. Protocol failed last labs 11/16/21. Do you want to continue refills ?     Requested Prescriptions  Pending Prescriptions Disp Refills   benazepril (LOTENSIN) 40 MG tablet [Pharmacy Med Name: BENAZEPRIL HCL 40MG  TABLET] 90 tablet 1    Sig: TAKE ONE TABLET BY MOUTH ONCE A DAY     Cardiovascular:  ACE Inhibitors Failed - 11/15/2022 11:59 AM      Failed - Cr in normal range and within 180 days    Creat  Date Value Ref Range Status  11/16/2021 0.85 0.60 - 1.00 mg/dL Final         Failed - K in normal range and within 180 days    Potassium  Date Value Ref Range Status  11/16/2021 4.4 3.5 - 5.3 mmol/L Final         Failed - Valid encounter within last 6 months    Recent Outpatient Visits           2 years ago Hypothyroidism, unspecified type   [facility] Valentino Nose, NP   2 years ago Essential hypertension   [facility] [place], Velna Hatchet, MD   3 years ago Encounter for medication management   [facility] Elmore Guise, FNP   4 years ago Essential hypertension   [facility] Danelle Berry, PA-C   5 years ago Peripheral vascular disease with claudication [facility])   [facility] Dorena Bodo, [place]              Passed - Patient is not pregnant      Passed - Last BP in normal range    BP Readings from Last 1 Encounters:  11/08/22 131/78

## 2022-11-21 ENCOUNTER — Other Ambulatory Visit: Payer: Self-pay | Admitting: Cardiology

## 2022-11-21 ENCOUNTER — Other Ambulatory Visit: Payer: Self-pay | Admitting: Family Medicine

## 2022-11-21 DIAGNOSIS — F419 Anxiety disorder, unspecified: Secondary | ICD-10-CM

## 2022-11-22 ENCOUNTER — Ambulatory Visit (HOSPITAL_COMMUNITY): Payer: Medicare Other

## 2022-11-22 ENCOUNTER — Other Ambulatory Visit: Payer: Self-pay | Admitting: Vascular Surgery

## 2022-11-22 ENCOUNTER — Encounter (HOSPITAL_COMMUNITY): Payer: Self-pay

## 2022-11-22 ENCOUNTER — Ambulatory Visit (HOSPITAL_COMMUNITY)
Admission: RE | Admit: 2022-11-22 | Discharge: 2022-11-22 | Disposition: A | Discharge status: Home / Self Care | Payer: Medicare Other | Source: Ambulatory Visit | Attending: Vascular Surgery | Admitting: Vascular Surgery

## 2022-11-22 DIAGNOSIS — I6523 Occlusion and stenosis of bilateral carotid arteries: Secondary | ICD-10-CM

## 2022-11-22 DIAGNOSIS — I708 Atherosclerosis of other arteries: Secondary | ICD-10-CM | POA: Diagnosis not present

## 2022-11-22 DIAGNOSIS — I700 Atherosclerosis of aorta: Secondary | ICD-10-CM | POA: Diagnosis not present

## 2022-11-22 MED ORDER — IOHEXOL 350 MG/ML SOLN
75.0000 mL | Freq: Once | INTRAVENOUS | Status: AC | PRN
  Administered 2022-11-22: 75 mL via INTRAVENOUS

## 2022-11-30 ENCOUNTER — Ambulatory Visit (INDEPENDENT_AMBULATORY_CARE_PROVIDER_SITE_OTHER): Payer: Medicare Other | Admitting: Family Medicine

## 2022-11-30 ENCOUNTER — Encounter: Payer: Self-pay | Admitting: Family Medicine

## 2022-11-30 VITALS — BP 130/50 | HR 94 | Temp 98.2°F | Ht 59.0 in | Wt 86.4 lb

## 2022-11-30 DIAGNOSIS — E039 Hypothyroidism, unspecified: Secondary | ICD-10-CM | POA: Diagnosis not present

## 2022-11-30 DIAGNOSIS — I10 Essential (primary) hypertension: Secondary | ICD-10-CM | POA: Diagnosis not present

## 2022-11-30 DIAGNOSIS — E785 Hyperlipidemia, unspecified: Secondary | ICD-10-CM | POA: Diagnosis not present

## 2022-11-30 DIAGNOSIS — D649 Anemia, unspecified: Secondary | ICD-10-CM

## 2022-11-30 DIAGNOSIS — F419 Anxiety disorder, unspecified: Secondary | ICD-10-CM

## 2022-11-30 MED ORDER — BUSPIRONE HCL 5 MG PO TABS: 5.0000 mg | ORAL_TABLET | Freq: Two times a day (BID) | ORAL | 3 refills | Status: DC

## 2022-11-30 NOTE — Assessment & Plan Note (Signed)
Chronic uncontrolled. Continue Celexa 40mg  daily and start Buspar 5mg  BID. Follow up in 6 weeks.

## 2022-11-30 NOTE — Assessment & Plan Note (Signed)
Continue Lipitor 80 mg daily.

## 2022-11-30 NOTE — Assessment & Plan Note (Signed)
TSH today. No symptoms of hypothyroidism or hyperthyroidism. Will refill if TSH normal. Follow up in 6 months.

## 2022-11-30 NOTE — Assessment & Plan Note (Signed)
Chronic. At goal <130/80 today. Continue Benazepril 40mg  daily.

## 2022-11-30 NOTE — Progress Notes (Signed)
Subjective:  HPI: Alexis [name] is a 77 y.o. female presenting on 11/30/2022 for Follow-up (med refills - [facility]\\\/)   HPI Patient is in today for medication management. She reports uncontrolled anxiety due to her worsening vision. She has a history of macular degeneration that is managed by Opthamology. Alexis [name] also has worsening carotid stenosis and has an upcoming CTA and upcoming discussions regarding treatment plan for this. She has been taking 40mg  Celexa daily for 1 year and reports increasing anxiety over the past few months.     11/30/2022   10:00 AM 11/16/2021    9:28 AM  GAD 7 : Generalized Anxiety Score  Nervous, Anxious, on Edge 2 3  Control/stop worrying 2 2  Worry too much - different things 1 2  Trouble relaxing 1 1  Restless 1 2  Easily annoyed or irritable 1 3  Afraid - awful might happen 3 2  Total GAD 7 Score 11 15  Anxiety Difficulty Somewhat difficult Somewhat difficult      Review of Systems  All other systems reviewed and are negative.   Relevant past medical history reviewed and updated as indicated.   Past Medical History:  Diagnosis Date   Anemia    Anxiety    Arthritis    Asthma    Claudication (HCC)    stent in bil iliac 11/25/10   Coronary artery disease    GERD (gastroesophageal reflux disease)    HTN (hypertension)    Hyperlipemia    Hypothyroidism    Murmur, cardiac    echo 10/12/10- EF >55%, mod sclerotic aortic valve   PONV (postoperative nausea and vomiting)    PVD (peripheral vascular disease) (HCC)    lower ext and upper ext-L subclavian stenosis by ultrasound, nl nuclear test- 10/27/10   Tobacco abuse    Vitamin D deficiency      Past Surgical History:  Procedure Laterality Date   CORONARY PRESSURE/FFR STUDY N/A 05/21/2020   Procedure: INTRAVASCULAR PRESSURE WIRE/FFR STUDY;  Surgeon: Runell Gess, MD;  [facility]: [facility];  Service: Cardiovascular;  Laterality: N/A;   CORONARY STENT INTERVENTION N/A 06/03/2020    Procedure: CORONARY STENT INTERVENTION;  Surgeon: Iran Ouch, MD;  Location: [facility];  Service: Cardiovascular;  Laterality: N/A;   ENDARTERECTOMY Right 07/05/2021   Procedure: RIGHT ENDARTERECTOMY CAROTID;  Surgeon: Cephus Shelling, MD;  [facility] OR;  Service: Vascular;  Laterality: Right;   KNEE SURGERY     LEFT HEART CATH AND CORONARY ANGIOGRAPHY N/A 05/21/2020   Procedure: LEFT HEART CATH AND CORONARY ANGIOGRAPHY;  Surgeon: Runell Gess, MD;  Location: [facility];  Service: Cardiovascular;  Laterality: N/A;   OPERATIVE ULTRASOUND  07/05/2021   Procedure: OPERATIVE ULTRASOUND;  Surgeon: Cephus Shelling, MD;  Location: [facility] OR;  Service: Vascular;;   PATCH ANGIOPLASTY Right 07/05/2021   Procedure: PATCH ANGIOPLASTY;  Surgeon: Cephus Shelling, MD;  Location: [facility] OR;  Service: Vascular;  Laterality: Right;   peripheral angio  11/25/10   right iliac-6x27 Express balloon expandable stent; Left iliac 6x37   PERIPHERAL INTRAVASCULAR LITHOTRIPSY  06/03/2020   Procedure: INTRAVASCULAR LITHOTRIPSY;  Surgeon: Iran Ouch, MD;  [facility]: [facility];  Service: Cardiovascular;;   TONSILLECTOMY      Allergies and medications reviewed and updated.   Current Outpatient Medications:    albuterol (VENTOLIN HFA) 108 (90 Base) MCG/ACT inhaler, Inhale 2 puffs into the lungs every 6 (six) hours as needed  for wheezing or shortness of breath., Disp: 18 g, Rfl: 2   aspirin EC 81 MG tablet, Take 81 mg by mouth in the morning. Swallow whole., Disp: , Rfl:    atorvastatin (LIPITOR) 80 MG tablet, TAKE ONE TABLET BY MOUTH ONCE A DAY, Disp: 90 tablet, Rfl: 3   benazepril (LOTENSIN) 40 MG tablet, TAKE ONE TABLET BY MOUTH ONCE A DAY, Disp: 90 tablet, Rfl: 1   busPIRone (BUSPAR) 5 MG tablet, Take 1 tablet (5 mg total) by mouth 2 (two) times daily., Disp: 60 tablet, Rfl: 3   Cholecalciferol (VITAMIN D3) 50 MCG (2000 UT) TABS, Take 2,000 Units by mouth in the morning.,  Disp: , Rfl:    citalopram (CELEXA) 40 MG tablet, Take 0.5 tablets (20 mg total) by mouth daily., Disp: 90 tablet, Rfl: 1   clopidogrel (PLAVIX) 75 MG tablet, TAKE 1 TABLET BY MOUTH ONCE A DAY WITH BREAKFAST, Disp: 90 tablet, Rfl: 3   diphenhydrAMINE (BENADRYL) 25 MG tablet, Take 25-50 mg by mouth every 6 (six) hours as needed for itching (ECZEMA)., Disp: , Rfl:    DUPIXENT 300 MG/2ML SOPN, Inject into the skin., Disp: , Rfl:    ferrous sulfate 324 MG TBEC, Take 324 mg by mouth daily with breakfast., Disp: , Rfl:    levothyroxine (SYNTHROID) 75 MCG tablet, TAKE ONE TABLET BY MOUTH EVERY MORNING BEFORE BREAKFAST, Disp: 30 tablet, Rfl: 0   Multiple Vitamins-Minerals (PRESERVISION AREDS 2) CAPS, Take 1 capsule by mouth once a week., Disp: , Rfl:    pantoprazole (PROTONIX) 40 MG tablet, TAKE ONE TABLET BY MOUTH ONCE DAILY, Disp: 90 tablet, Rfl: 1   nitroGLYCERIN (NITROSTAT) 0.4 MG SL tablet, Place 1 tablet (0.4 mg total) under the tongue every 5 (five) minutes as needed for chest pain., Disp: 25 tablet, Rfl: 3   oxyCODONE-acetaminophen (PERCOCET/ROXICET) 5-325 MG tablet, Take 1 tablet by mouth every 6 (six) hours as needed for moderate pain. (Patient not taking: Reported on 11/30/2022), Disp: 12 tablet, Rfl: 0  Allergies  Allergen Reactions   Demerol [Meperidine] Nausea And Vomiting   Metoprolol     asthma   Oatmeal Hives   Penicillins Rash    Objective:   BP (!) 130/50   Pulse 94   Temp 98.2 F (36.8 C) (Oral)   Ht 4\' 11"  (1.499 m)   Wt 86 lb 6.4 oz (39.2 kg)   SpO2 90%   BMI 17.45 kg/m      11/30/2022    9:02 AM 11/08/2022    9:22 AM 11/08/2022    9:20 AM  Vitals with BMI  Height 4\' 11"   4\' 11"   Weight 86 lbs 6 oz  85 lbs 13 oz  BMI 17.44  17.32  Systolic 130 131 161  Diastolic 50 78 77  Pulse 94  61     Physical Exam Vitals and nursing note reviewed.  Constitutional:      Appearance: Normal appearance. She is normal weight.  HENT:     Head: Normocephalic and atraumatic.   Cardiovascular:     Rate and Rhythm: Normal rate and regular rhythm.     Pulses: Normal pulses.     Heart sounds: Murmur heard.  Pulmonary:     Effort: Pulmonary effort is normal.     Breath sounds: Normal breath sounds.  Skin:    General: Skin is warm and dry.  Neurological:     General: No focal deficit present.     Mental Status: She is alert and oriented  to person, place, and time. Mental status is at baseline.  Psychiatric:        Mood and Affect: Mood normal.        Behavior: Behavior normal.        Thought Content: Thought content normal.        Judgment: Judgment normal.     Assessment & Plan:  Hyperlipidemia, unspecified hyperlipidemia type Assessment & Plan: Continue Lipitor 80mg  daily.  Orders: -     CBC with Differential/Platelet -     COMPLETE METABOLIC PANEL WITH GFR -     Lipid panel  Essential hypertension Assessment & Plan: Chronic. At goal <130/80 today. Continue Benazepril 40mg  daily.  Orders: -     CBC with Differential/Platelet -     COMPLETE METABOLIC PANEL WITH GFR -     Lipid panel  Hypothyroidism, unspecified type Assessment & Plan: TSH today. No symptoms of hypothyroidism or hyperthyroidism. Will refill if TSH normal. Follow up in 6 months.  Orders: -     TSH  Anemia, unspecified type Assessment & Plan: CBC today. Continue ferrous sulfate.  Orders: -     CBC with Differential/Platelet  Anxiety Assessment & Plan: Chronic uncontrolled. Continue Celexa 40mg  daily and start Buspar 5mg  BID. Follow up in 6 weeks.   Other orders -     busPIRone HCl; Take 1 tablet (5 mg total) by mouth 2 (two) times daily.  Dispense: 60 tablet; Refill: 3     Follow up plan: Return in about 6 weeks (around 01/11/2023) for anxiety/depression.  Park Meo, FNP

## 2022-11-30 NOTE — Assessment & Plan Note (Signed)
CBC today. Continue ferrous sulfate.

## 2022-12-01 ENCOUNTER — Telehealth: Payer: Self-pay

## 2022-12-01 LAB — CBC WITH DIFFERENTIAL/PLATELET
Absolute Monocytes: 724 {cells}/uL (ref 200–950)
Basophils Absolute: 56 {cells}/uL (ref 0–200)
Basophils Relative: 0.6 %
Eosinophils Absolute: 743 {cells}/uL — ABNORMAL HIGH (ref 15–500)
Eosinophils Relative: 7.9 %
HCT: 39 % (ref 35.0–45.0)
Hemoglobin: 12.7 g/dL (ref 11.7–15.5)
Lymphs Abs: 1053 {cells}/uL (ref 850–3900)
MCH: 33.3 pg — ABNORMAL HIGH (ref 27.0–33.0)
MCHC: 32.6 g/dL (ref 32.0–36.0)
MCV: 102.4 fL — ABNORMAL HIGH (ref 80.0–100.0)
MPV: 11.3 fL (ref 7.5–12.5)
Monocytes Relative: 7.7 %
Neutro Abs: 6824 {cells}/uL (ref 1500–7800)
Neutrophils Relative %: 72.6 %
Platelets: 262 10*3/uL (ref 140–400)
RBC: 3.81 10*6/uL (ref 3.80–5.10)
RDW: 11.7 % (ref 11.0–15.0)
Total Lymphocyte: 11.2 %
WBC: 9.4 10*3/uL (ref 3.8–10.8)

## 2022-12-01 LAB — COMPLETE METABOLIC PANEL WITH GFR
AG Ratio: 1.8 (calc) (ref 1.0–2.5)
ALT: 12 U/L (ref 6–29)
AST: 18 U/L (ref 10–35)
Albumin: 4.2 g/dL (ref 3.6–5.1)
Alkaline phosphatase (APISO): 102 U/L (ref 37–153)
BUN: 15 mg/dL (ref 7–25)
CO2: 30 mmol/L (ref 20–32)
Calcium: 9.6 mg/dL (ref 8.6–10.4)
Chloride: 101 mmol/L (ref 98–110)
Creat: 0.84 mg/dL (ref 0.60–1.00)
Globulin: 2.4 g/dL (ref 1.9–3.7)
Glucose, Bld: 83 mg/dL (ref 65–99)
Potassium: 4.3 mmol/L (ref 3.5–5.3)
Sodium: 140 mmol/L (ref 135–146)
Total Bilirubin: 0.4 mg/dL (ref 0.2–1.2)
Total Protein: 6.6 g/dL (ref 6.1–8.1)
eGFR: 71 mL/min/{1.73_m2} (ref >=60)

## 2022-12-01 LAB — LIPID PANEL
Cholesterol: 139 mg/dL (ref <200)
HDL: 60 mg/dL (ref >=50)
LDL Cholesterol (Calc): 60 mg/dL
Non-HDL Cholesterol (Calc): 79 mg/dL (ref <130)
Total CHOL/HDL Ratio: 2.3 (calc) (ref <5.0)
Triglycerides: 102 mg/dL (ref <150)

## 2022-12-01 LAB — TSH: TSH: 1.1 m[IU]/L (ref 0.40–4.50)

## 2022-12-01 NOTE — Telephone Encounter (Signed)
[place] pharmacy called in for pt's refill of this med busPIRone (BUSPAR) 5 MG tablet [[id number]] to be sent in. Pt was seen by pcp 11/30/22 but refill was not sent in. Please advise.  Cb#: [facility] [phone]

## 2022-12-02 NOTE — Telephone Encounter (Signed)
Called and LVM for pt, meds has been sent on 11/30/22 per FNP to [facility] pharmacy

## 2022-12-05 ENCOUNTER — Other Ambulatory Visit: Payer: Self-pay | Admitting: Family Medicine

## 2022-12-05 DIAGNOSIS — E039 Hypothyroidism, unspecified: Secondary | ICD-10-CM

## 2022-12-05 NOTE — Progress Notes (Unsigned)
Patient name: Alexis [name] MRN: [id number] DOB: 07-09-1945 Sex: female  REASON FOR CONSULT: 41-month follow-up for surveillance of carotid artery disease  HPI: Alexis [name] is a 77 y.o. female, history of hypertension, hyperlipidemia, PAD with claudication, coronary artery disease that presents for follow-up after CTA neck for further evaluation of her carotid artery disease.  Patient previously underwent right carotid endarterectomy on 07/05/2021.  Recently noted to have progression of for her left carotid stenosis from a duplex on 11/08/2022 with a 80-99% left ICA stenosis.  She was sent for CTA neck that was done on 11/22/2022.  This shows right ICA occlusion.  Past Medical History:  Diagnosis Date   Anemia    Anxiety    Arthritis    Asthma    Claudication (HCC)    stent in bil iliac 11/25/10   Coronary artery disease    GERD (gastroesophageal reflux disease)    HTN (hypertension)    Hyperlipemia    Hypothyroidism    Murmur, cardiac    echo 10/12/10- EF >55%, mod sclerotic aortic valve   PONV (postoperative nausea and vomiting)    PVD (peripheral vascular disease) (HCC)    lower ext and upper ext-L subclavian stenosis by ultrasound, nl nuclear test- 10/27/10   Tobacco abuse    Vitamin D deficiency     Past Surgical History:  Procedure Laterality Date   CORONARY PRESSURE/FFR STUDY N/A 05/21/2020   Procedure: INTRAVASCULAR PRESSURE WIRE/FFR STUDY;  Surgeon: Runell Gess, MD;  [facility];  Service: Cardiovascular;  Laterality: N/A;   CORONARY STENT INTERVENTION N/A 06/03/2020   Procedure: CORONARY STENT INTERVENTION;  Surgeon: Iran Ouch, MD;  [facility]: [facility];  Service: Cardiovascular;  Laterality: N/A;   ENDARTERECTOMY Right 07/05/2021   Procedure: RIGHT ENDARTERECTOMY CAROTID;  Surgeon: Cephus Shelling, MD;  Location: [facility] OR;  Service: Vascular;  Laterality: Right;   KNEE SURGERY     LEFT HEART CATH AND CORONARY ANGIOGRAPHY N/A  05/21/2020   Procedure: LEFT HEART CATH AND CORONARY ANGIOGRAPHY;  Surgeon: Runell Gess, MD;  [facility];  Service: Cardiovascular;  Laterality: N/A;   OPERATIVE ULTRASOUND  07/05/2021   Procedure: OPERATIVE ULTRASOUND;  Surgeon: Cephus Shelling, MD;  Location: [facility] OR;  Service: Vascular;;   PATCH ANGIOPLASTY Right 07/05/2021   Procedure: PATCH ANGIOPLASTY;  Surgeon: Cephus Shelling, MD;  Location: [facility] OR;  Service: Vascular;  Laterality: Right;   peripheral angio  11/25/10   right iliac-6x27 Express balloon expandable stent; Left iliac 6x37   PERIPHERAL INTRAVASCULAR LITHOTRIPSY  06/03/2020   Procedure: INTRAVASCULAR LITHOTRIPSY;  Surgeon: Iran Ouch, MD;  [facility];  Service: Cardiovascular;;   TONSILLECTOMY      Family History  Problem Relation Age of Onset   Allergic rhinitis Father    Allergic rhinitis Maternal Grandmother    Urticaria Maternal Grandmother    Asthma Maternal Grandfather    Allergic rhinitis Paternal Grandmother    Angioedema Neg Hx    Eczema Neg Hx    Immunodeficiency Neg Hx     SOCIAL HISTORY: Social History   Socioeconomic History   Marital status: Married    Spouse name: Not on file   Number of children: Not on file   Years of education: Not on file   Highest education level: Not on file  Occupational History   Not on file  Tobacco Use   Smoking status: Former  Current packs/day: 0.00    Types: Cigarettes    Quit date: 02/28/2002    Years since quitting: 20.7    Passive exposure: Never   Smokeless tobacco: Never  Vaping Use   Vaping status: Never Used  Substance and Sexual Activity   Alcohol use: No   Drug use: No   Sexual activity: Not on file  Other Topics Concern   Not on file  Social History Narrative   Not on file   Social Determinants of Health   Financial Resource Strain: Low Risk  (03/22/2022)   Overall Financial Resource Strain (CARDIA)    Difficulty of Paying Living Expenses:  Not hard at all  Food Insecurity: No Food Insecurity (03/22/2022)   Hunger Vital Sign    Worried About Running Out of Food in the Last Year: Never true    Ran Out of Food in the Last Year: Never true  Transportation Needs: No Transportation Needs (03/22/2022)   PRAPARE - Administrator, Civil Service (Medical): No    Lack of Transportation (Non-Medical): No  Physical Activity: Unknown (03/22/2022)   Exercise Vital Sign    Days of Exercise per Week: 0 days    Minutes of Exercise per Session: Not on file  Stress: No Stress Concern Present (03/22/2022)   [employer] of Occupational Health - Occupational Stress Questionnaire    Feeling of Stress : Not at all  Social Connections: Socially Integrated (03/22/2022)   Social Connection and Isolation Panel [NHANES]    Frequency of Communication with Friends and Family: More than three times a week    Frequency of Social Gatherings with Friends and Family: Three times a week    Attends Religious Services: More than 4 times per year    Active Member of Clubs or Organizations: Yes    Attends Banker Meetings: More than 4 times per year    Marital Status: Married  Catering manager Violence: Not At Risk (03/22/2022)   Humiliation, Afraid, Rape, and Kick questionnaire    Fear of Current or Ex-Partner: No    Emotionally Abused: No    Physically Abused: No    Sexually Abused: No    Allergies  Allergen Reactions   Demerol [Meperidine] Nausea And Vomiting   Metoprolol     asthma   Oatmeal Hives   Penicillins Rash    Current Outpatient Medications  Medication Sig Dispense Refill   albuterol (VENTOLIN HFA) 108 (90 Base) MCG/ACT inhaler Inhale 2 puffs into the lungs every 6 (six) hours as needed for wheezing or shortness of breath. 18 g 2   aspirin EC 81 MG tablet Take 81 mg by mouth in the morning. Swallow whole.     atorvastatin (LIPITOR) 80 MG tablet TAKE ONE TABLET BY MOUTH ONCE A DAY 90 tablet 3   benazepril  (LOTENSIN) 40 MG tablet TAKE ONE TABLET BY MOUTH ONCE A DAY 90 tablet 1   busPIRone (BUSPAR) 5 MG tablet Take 1 tablet (5 mg total) by mouth 2 (two) times daily. 60 tablet 3   Cholecalciferol (VITAMIN D3) 50 MCG (2000 UT) TABS Take 2,000 Units by mouth in the morning.     citalopram (CELEXA) 40 MG tablet Take 0.5 tablets (20 mg total) by mouth daily. 90 tablet 1   clopidogrel (PLAVIX) 75 MG tablet TAKE 1 TABLET BY MOUTH ONCE A DAY WITH BREAKFAST 90 tablet 3   diphenhydrAMINE (BENADRYL) 25 MG tablet Take 25-50 mg by mouth every 6 (six) hours as needed  for itching (ECZEMA).     DUPIXENT 300 MG/2ML SOPN Inject into the skin.     ferrous sulfate 324 MG TBEC Take 324 mg by mouth daily with breakfast.     levothyroxine (SYNTHROID) 75 MCG tablet TAKE ONE TABLET BY MOUTH EVERY MORNING BEFORE BREAKFAST 30 tablet 0   Multiple Vitamins-Minerals (PRESERVISION AREDS 2) CAPS Take 1 capsule by mouth once a week.     nitroGLYCERIN (NITROSTAT) 0.4 MG SL tablet Place 1 tablet (0.4 mg total) under the tongue every 5 (five) minutes as needed for chest pain. 25 tablet 3   oxyCODONE-acetaminophen (PERCOCET/ROXICET) 5-325 MG tablet Take 1 tablet by mouth every 6 (six) hours as needed for moderate pain. (Patient not taking: Reported on 11/30/2022) 12 tablet 0   pantoprazole (PROTONIX) 40 MG tablet TAKE ONE TABLET BY MOUTH ONCE DAILY 90 tablet 1   No current facility-administered medications for this visit.    REVIEW OF SYSTEMS:  [X]  denotes positive finding, [ ]  denotes negative finding Cardiac  Comments:  Chest pain or chest pressure:    Shortness of breath upon exertion:    Short of breath when lying flat:    Irregular heart rhythm:        Vascular    Pain in calf, thigh, or hip brought on by ambulation:    Pain in feet at night that wakes you up from your sleep:     Blood clot in your veins:    Leg swelling:         Pulmonary    Oxygen at home:    Productive cough:     Wheezing:         Neurologic     Sudden weakness in arms or legs:     Sudden numbness in arms or legs:     Sudden onset of difficulty speaking or slurred speech:    Temporary loss of vision in one eye:     Problems with dizziness:         Gastrointestinal    Blood in stool:     Vomited blood:         Genitourinary    Burning when urinating:     Blood in urine:        Psychiatric    Major depression:         Hematologic    Bleeding problems:    Problems with blood clotting too easily:        Skin    Rashes or ulcers:        Constitutional    Fever or chills:      PHYSICAL EXAM: There were no vitals filed for this visit.   GENERAL: The patient is a well-nourished female, in no acute distress. The vital signs are documented above. CARDIAC: There is a regular rate and rhythm.  VASCULAR:  Right radial pulse palpable, left radial pulse nonpalpable PULMONARY: No respiratory distress ABDOMEN: Soft and non-tender. MUSCULOSKELETAL: There are no major deformities or cyanosis. NEUROLOGIC: No focal weakness or paresthesias are detected.  Cranial nerves II through XII grossly intact. SKIN: There are no ulcers or rashes noted. PSYCHIATRIC: The patient has a normal affect.  DATA:   Carotid duplex 11/08/2022 shows a 80-99% left ICA stenosis with a velocity of 581/159.  Assessment/Plan:   77 y.o. female, history of hypertension, hyperlipidemia, PAD with claudication, coronary artery disease that presents for follow-up after CTA neck for further evaluation of her carotid artery disease.  Patient previously underwent right carotid  endarterectomy on 07/05/2021.  Recently noted to have progression of for her left carotid stenosis from a duplex on 11/08/2022 with a 80-99% left ICA stenosis.  She was sent for CTA neck that was done on 11/22/2022.    Cephus Shelling, MD [facility] Office: [phone]

## 2022-12-06 ENCOUNTER — Other Ambulatory Visit: Payer: Self-pay

## 2022-12-06 ENCOUNTER — Encounter: Payer: Self-pay | Admitting: Vascular Surgery

## 2022-12-06 ENCOUNTER — Ambulatory Visit: Payer: Medicare Other | Admitting: Vascular Surgery

## 2022-12-06 VITALS — BP 131/77 | HR 68 | Temp 98.0°F | Resp 18 | Ht 59.0 in | Wt 87.1 lb

## 2022-12-06 DIAGNOSIS — I6522 Occlusion and stenosis of left carotid artery: Secondary | ICD-10-CM

## 2022-12-08 ENCOUNTER — Telehealth: Payer: Self-pay

## 2022-12-08 NOTE — Telephone Encounter (Signed)
Pt called in stating that the new anxiety med that NP put her on is not working well. Pt states that she is very agitated at times and would like to know if she could go back to her previous med citalopram. Pt would like to speak with nurse please. Please advise.  Cb#: [phone]

## 2022-12-12 ENCOUNTER — Other Ambulatory Visit: Payer: Self-pay | Admitting: Family Medicine

## 2022-12-12 DIAGNOSIS — F419 Anxiety disorder, unspecified: Secondary | ICD-10-CM

## 2022-12-12 MED ORDER — CITALOPRAM HYDROBROMIDE 40 MG PO TABS: 20.0000 mg | ORAL_TABLET | Freq: Every day | ORAL | 1 refills | Status: DC

## 2022-12-12 NOTE — Telephone Encounter (Signed)
Pt called in again to speak with nurse/pcp about getting Celexa refilled. Pt stated that she has stopped taking the other med that NP prescribed for her recently as it was making her feel agitated. Pt would really like for NP to send in a refill of Celexa as pt stated that this med works better for her. Please advise.  Cb#: [phone]

## 2022-12-12 NOTE — Pre-Procedure Instructions (Signed)
Surgical Instructions   Your procedure is scheduled on December 19, 2022. Report to [facility] Main Entrance "A" at 7:30 A.M., then check in with the Admitting office. Any questions or running late day of surgery: call [phone]  Questions prior to your surgery date: call [phone], Monday-Friday, 8am-4pm. If you experience any cold or flu symptoms such as cough, fever, chills, shortness of breath, etc. between now and your scheduled surgery, please notify us at the above number.     Remember:  Do not eat or drink after midnight the night before your surgery    Take these medicines the morning of surgery with A SIP OF WATER: aspirin  atorvastatin (LIPITOR)  busPIRone (BUSPAR)  citalopram (CELEXA)  clopidogrel (PLAVIX)  levothyroxine (SYNTHROID)  pantoprazole (PROTONIX)    May take these medicines IF NEEDED: albuterol (VENTOLIN HFA) inhaler  nitroGLYCERIN (NITROSTAT) - please call [phone] if dose taken prior to surgery   One week prior to surgery, STOP taking any Aleve, Naproxen, Ibuprofen, Motrin, Advil, Goody's, BC's, all herbal medications, fish oil, and non-prescription vitamins.                     Do NOT Smoke (Tobacco/Vaping) for 24 hours prior to your procedure.  If you use a CPAP at night, you may bring your mask/headgear for your overnight stay.   You will be asked to remove any contacts, glasses, piercing's, hearing aid's, dentures/partials prior to surgery. Please bring cases for these items if needed.    Patients discharged the day of surgery will not be allowed to drive home, and someone needs to stay with them for 24 hours.  SURGICAL WAITING ROOM VISITATION Patients may have no more than 2 support people in the waiting area - these visitors may rotate.   Pre-op nurse will coordinate an appropriate time for 1 ADULT support person, who may not rotate, to accompany patient in pre-op.  Children under the age of 50 must have an adult with them who is not  the patient and must remain in the main waiting area with an adult.  If the patient needs to stay at the hospital during part of their recovery, the visitor guidelines for inpatient rooms apply.  Please refer to the [facility] website for the visitor guidelines for any additional information.   If you received a COVID test during your pre-op visit  it is requested that you wear a mask when out in public, stay away from anyone that may not be feeling well and notify your surgeon if you develop symptoms. If you have been in contact with anyone that has tested positive in the last 10 days please notify you surgeon.      Pre-operative CHG Bathing Instructions   You can play a key role in reducing the risk of infection after surgery. Your skin needs to be as free of germs as possible. You can reduce the number of germs on your skin by washing with CHG (chlorhexidine gluconate) soap before surgery. CHG is an antiseptic soap that kills germs and continues to kill germs even after washing.   DO NOT use if you have an allergy to chlorhexidine/CHG or antibacterial soaps. If your skin becomes reddened or irritated, stop using the CHG and notify one of our RNs at [phone].              TAKE A SHOWER THE NIGHT BEFORE SURGERY AND THE DAY OF SURGERY    Please keep in mind the following:  DO NOT shave, including legs and underarms, 48 hours prior to surgery.   You may shave your face before/day of surgery.  Place clean sheets on your bed the night before surgery Use a clean washcloth (not used since being washed) for each shower. DO NOT sleep with pet's night before surgery.  CHG Shower Instructions:  Wash your face and private area with normal soap. If you choose to wash your hair, wash first with your normal shampoo.  After you use shampoo/soap, rinse your hair and body thoroughly to remove shampoo/soap residue.  Turn the water OFF and apply half the bottle of CHG soap to a CLEAN washcloth.   Apply CHG soap ONLY FROM YOUR NECK DOWN TO YOUR TOES (washing for 3-5 minutes)  DO NOT use CHG soap on face, private areas, open wounds, or sores.  Pay special attention to the area where your surgery is being performed.  If you are having back surgery, having someone wash your back for you may be helpful. Wait 2 minutes after CHG soap is applied, then you may rinse off the CHG soap.  Pat dry with a clean towel  Put on clean pajamas    Additional instructions for the day of surgery: DO NOT APPLY any lotions, deodorants, cologne, or perfumes.   Do not wear jewelry or makeup Do not wear nail polish, gel polish, artificial nails, or any other type of covering on natural nails (fingers and toes) Do not bring valuables to the hospital. [facility] is not responsible for valuables/personal belongings. Put on clean/comfortable clothes.  Please brush your teeth.  Ask your nurse before applying any prescription medications to the skin.

## 2022-12-12 NOTE — Telephone Encounter (Signed)
Called and spoke w/pt and is aware of NP advice and agrees to NP recommendations.

## 2022-12-13 ENCOUNTER — Other Ambulatory Visit: Payer: Self-pay

## 2022-12-13 ENCOUNTER — Encounter (HOSPITAL_COMMUNITY)
Admission: RE | Admit: 2022-12-13 | Discharge: 2022-12-13 | Disposition: A | Discharge status: Home / Self Care | Payer: Medicare Other | Source: Ambulatory Visit | Attending: Vascular Surgery | Admitting: Vascular Surgery

## 2022-12-13 ENCOUNTER — Encounter (HOSPITAL_COMMUNITY): Payer: Self-pay

## 2022-12-13 VITALS — BP 169/56 | HR 71 | Temp 98.2°F | Resp 18 | Ht 59.0 in | Wt 87.7 lb

## 2022-12-13 DIAGNOSIS — I6522 Occlusion and stenosis of left carotid artery: Secondary | ICD-10-CM | POA: Insufficient documentation

## 2022-12-13 DIAGNOSIS — I10 Essential (primary) hypertension: Secondary | ICD-10-CM | POA: Diagnosis not present

## 2022-12-13 DIAGNOSIS — Z01812 Encounter for preprocedural laboratory examination: Secondary | ICD-10-CM | POA: Diagnosis not present

## 2022-12-13 DIAGNOSIS — I2510 Atherosclerotic heart disease of native coronary artery without angina pectoris: Secondary | ICD-10-CM | POA: Insufficient documentation

## 2022-12-13 DIAGNOSIS — E785 Hyperlipidemia, unspecified: Secondary | ICD-10-CM | POA: Insufficient documentation

## 2022-12-13 DIAGNOSIS — Z01818 Encounter for other preprocedural examination: Secondary | ICD-10-CM

## 2022-12-13 HISTORY — DX: Personal history of other diseases of the digestive system: Z87.19

## 2022-12-13 HISTORY — DX: Depression, unspecified: F32.A

## 2022-12-13 HISTORY — DX: Headache, unspecified: R51.9

## 2022-12-13 LAB — PROTIME-INR
INR: 1 (ref 0.8–1.2)
Prothrombin Time: 13 s (ref 11.4–15.2)

## 2022-12-13 LAB — COMPREHENSIVE METABOLIC PANEL
ALT: 16 U/L (ref 0–44)
AST: 20 U/L (ref 15–41)
Albumin: 4 g/dL (ref 3.5–5.0)
Alkaline Phosphatase: 91 U/L (ref 38–126)
Anion gap: 9 (ref 5–15)
BUN: 9 mg/dL (ref 8–23)
CO2: 30 mmol/L (ref 22–32)
Calcium: 9.7 mg/dL (ref 8.9–10.3)
Chloride: 103 mmol/L (ref 98–111)
Creatinine, Ser: 0.89 mg/dL (ref 0.44–1.00)
GFR, Estimated: >60 mL/min (ref >60)
Glucose, Bld: 82 mg/dL (ref 70–99)
Potassium: 3.8 mmol/L (ref 3.5–5.1)
Sodium: 142 mmol/L (ref 135–145)
Total Bilirubin: 0.6 mg/dL (ref 0.3–1.2)
Total Protein: 7 g/dL (ref 6.5–8.1)

## 2022-12-13 LAB — URINALYSIS, ROUTINE W REFLEX MICROSCOPIC
Bilirubin Urine: NEGATIVE
Glucose, UA: NEGATIVE mg/dL
Hgb urine dipstick: NEGATIVE
Ketones, ur: NEGATIVE mg/dL
Leukocytes,Ua: NEGATIVE
Nitrite: NEGATIVE
Protein, ur: NEGATIVE mg/dL
Specific Gravity, Urine: 1.005 (ref 1.005–1.030)
pH: 6 (ref 5.0–8.0)

## 2022-12-13 LAB — SURGICAL PCR SCREEN
MRSA, PCR: NEGATIVE
Staphylococcus aureus: POSITIVE — AB

## 2022-12-13 LAB — CBC
HCT: 41.1 % (ref 36.0–46.0)
Hemoglobin: 12.8 g/dL (ref 12.0–15.0)
MCH: 32.2 pg (ref 26.0–34.0)
MCHC: 31.1 g/dL (ref 30.0–36.0)
MCV: 103.3 fL — ABNORMAL HIGH (ref 80.0–100.0)
Platelets: 248 10*3/uL (ref 150–400)
RBC: 3.98 MIL/uL (ref 3.87–5.11)
RDW: 12.8 % (ref 11.5–15.5)
WBC: 6 10*3/uL (ref 4.0–10.5)
nRBC: 0 % (ref 0.0–0.2)

## 2022-12-13 LAB — TYPE AND SCREEN
ABO/RH(D): O POS
Antibody Screen: NEGATIVE

## 2022-12-13 LAB — APTT: aPTT: 27 s (ref 24–36)

## 2022-12-13 NOTE — Progress Notes (Addendum)
PCP - Kurtis Bushman, FNP Cardiologist - Dr. Jodelle Red - Last office visit 10/28/2022  PPM/ICD - Denies Device Orders - n/a Rep Notified - n/a  Chest x-ray - Denies EKG - 10/28/2022 Stress Test - 10/27/2010 ECHO - 10/12/2010 Cardiac Cath - 06/03/2020 - 2 stents placed  Sleep Study - Denies CPAP - n/a  No DM  Last dose of GLP1 agonist- n/a GLP1 instructions: n/a  Blood Thinner Instructions: Pt will continue Plavix through DOS Aspirin Instructions: Pt will continue ASA through DOS  NPO after midnight  COVID TEST- n/a   Anesthesia review: Yes. Cardiac hx including CAD with stents, heart murmur and HTN.   Patient denies shortness of breath, fever, cough and chest pain at PAT appointment. Pt endorses runny nose from seasonal allergies, but no other respiratory illness/infection in the last two months.    All instructions explained to the patient, with a verbal understanding of the material. Patient agrees to go over the instructions while at home for a better understanding. Patient also instructed to self quarantine after being tested for COVID-19. The opportunity to ask questions was provided. Pt noted an itchy rash when she uses CHG products. Pt instructed to bath with DIAL SOAP in place of the CHG soap for surgery. Pt understood instructions.

## 2022-12-14 NOTE — Anesthesia Preprocedure Evaluation (Addendum)
Anesthesia Evaluation  Patient identified by MRN, date of birth, ID band Patient awake    Reviewed: Allergy & Precautions, NPO status , Patient's Chart, lab work & pertinent test results  History of Anesthesia Complications (+) PONV and history of anesthetic complications  Airway Mallampati: II  TM Distance: >3 FB Neck ROM: Full    Dental  (+) Edentulous Upper, Edentulous Lower, Dental Advisory Given   Pulmonary neg shortness of breath, asthma , neg sleep apnea, neg COPD, neg recent URI, former smoker   breath sounds clear to auscultation       Cardiovascular hypertension, Pt. on medications (-) angina + CAD, + Cardiac Stents and + Peripheral Vascular Disease  (-) CHF  Rhythm:Regular  ? Ost RCA to Prox RCA lesion is 99% stenosed. ? Mid RCA lesion is 75% stenosed. ? Post intervention, there is a 0% residual stenosis. ? A drug-eluting stent was successfully placed using a STENT RESOLUTE ONYX 2.25X12. ? Post intervention, there is a 15% residual stenosis. ? A drug-eluting stent was successfully placed using a STENT RESOLUTE ONYX 3.5X12.   Successful but very difficult angioplasty, intravascular lithotripsy and drug-coated balloon angioplasty to the ostial right coronary artery as well as drug-eluting stent placement to the mid right coronary artery.  Very difficult procedure due to calcifications, ostial stenosis and tortuosity.  Recommendations: Dual antiplatelet therapy for at least 12 months. Aggressive treatment of risk factors. Ostial RCA stent is high risk for in-stent restenosis.    Neuro/Psych  Headaches PSYCHIATRIC DISORDERS Anxiety Depression       GI/Hepatic Neg liver ROS,GERD  Medicated and Controlled,,  Endo/Other  Hypothyroidism    Renal/GU negative Renal ROS     Musculoskeletal   Abdominal   Peds  Hematology  (+) Blood dyscrasia Lab Results      Component                Value               Date                       WBC                      7.0                 07/06/2020                HGB                      11.4                07/06/2020                HCT                      36.0                07/06/2020                MCV                      93                  07/06/2020                PLT  279                 07/06/2020            plavix    Anesthesia Other Findings   Reproductive/Obstetrics                             Anesthesia Physical Anesthesia Plan  ASA: 3  Anesthesia Plan: General   Post-op Pain Management:    Induction: Intravenous  PONV Risk Score and Plan: 4 or greater and Ondansetron, Dexamethasone, Treatment may vary due to age or medical condition, Midazolam and Droperidol  Airway Management Planned: Oral ETT  Additional Equipment: Arterial line  Intra-op Plan:   Post-operative Plan: Extubation in OR  Informed Consent: I have reviewed the patients History and Physical, chart, labs and discussed the procedure including the risks, benefits and alternatives for the proposed anesthesia with the patient or authorized representative who has indicated his/her understanding and acceptance.     Dental advisory given  Plan Discussed with: CRNA  Anesthesia Plan Comments: (PAT note by Antionette Poles, PA-C: 77 year old female follow with cardiology for history of CAD with complex PCI of RCA 05/2020, PAD (carotid artery disease, subclavian artery stenosis, claudication), hypertension, hyperlipidemia.  Last seen by Dr. Cristal Deer on 10/28/2022.  Per note, she is able to be relatively active without chest pain, blood pressure remains well-controlled.  No changes to management, recommend continue DAPT for at least 12 months given complex ostial RCA stenosis, ideally lifelong if she can tolerate.  Discussed that she will be following with vascular surgery for progressive carotid stenosis.  1 year cardiology follow-up  recommended.  Seen by vascular surgeon Dr. Chestine Spore on 12/06/2022.  Carotid duplex 11/08/2022 showed progression of left ICA stenosis, now 80 to 99%.  CTA neck done 11/22/2022 showed right ICA occlusion (previously underwent right CEA on 07/05/2021).  TCAR recommended for high-grade left ICA stenosis.  Other pertinent history includes GERD maintained on pantoprazole, hiatal hernia, postoperative nausea and vomiting, migraines, hypothyroid, former smoker (quit 2004).  EKG 10/28/2022: NSR.  Rate 73.  Possible LAE.  Carotid duplex 11/08/2022: Summary:  Right Carotid: Velocities in the right ICA suggest a 1-39% stenosis, however dampened CCA waveforms ( with no diastolic flow) suggest probable  higher velocity in the ICA.  Left Carotid: Velocities in the left ICA are consistent with a 80-99% stenosis.  Vertebrals:Right vertebral artery demonstrates antegrade flow. Left vertebral artery demonstrates retrograde flow.  Subclavians: Normal flow hemodynamics were seen in the right subclavian artery. Left Subclavian flow appears retrograde.    Cath and PCI 06/03/2020:  Ost RCA to Prox RCA lesion is 99% stenosed.  Mid RCA lesion is 75% stenosed.  Post intervention, there is a 0% residual stenosis.  A drug-eluting stent was successfully placed using a STENT RESOLUTE ONYX 2.25X12.  Post intervention, there is a 15% residual stenosis.  A drug-eluting stent was successfully placed using a STENT RESOLUTE ONYX 3.5X12.   Successful but very difficult angioplasty, intravascular lithotripsy and drug-coated balloon angioplasty to the ostial right coronary artery as well as drug-eluting stent placement to the mid right coronary artery.  Very difficult procedure due to calcifications, ostial stenosis and tortuosity.  Recommendations: Dual antiplatelet therapy for at least 12 months. Aggressive treatment of risk factors. Ostial RCA stent is high risk for in-stent restenosis.     )        Anesthesia  Quick Evaluation

## 2022-12-14 NOTE — Progress Notes (Signed)
Anesthesia Chart Review:  77 year old female follow with cardiology for history of CAD with complex PCI of RCA 05/2020, PAD (carotid artery disease, subclavian artery stenosis, claudication), hypertension, hyperlipidemia.  Last seen by Dr. Cristal Deer on 10/28/2022.  Per note, she is able to be relatively active without chest pain, blood pressure remains well-controlled.  No changes to management, recommend continue DAPT for at least 12 months given complex ostial RCA stenosis, ideally lifelong if she can tolerate.  Discussed that she will be following with vascular surgery for progressive carotid stenosis.  1 year cardiology follow-up recommended.  Seen by vascular surgeon Dr. Chestine Spore on 12/06/2022.  Carotid duplex 11/08/2022 showed progression of left ICA stenosis, now 80 to 99%.  CTA neck done 11/22/2022 showed right ICA occlusion (previously underwent right CEA on 07/05/2021).  TCAR recommended for high-grade left ICA stenosis.  Other pertinent history includes GERD maintained on pantoprazole, hiatal hernia, postoperative nausea and vomiting, migraines, hypothyroid, former smoker (quit 2004).  EKG 10/28/2022: NSR.  Rate 73.  Possible LAE.  Carotid duplex 11/08/2022: Summary:  Right Carotid: Velocities in the right ICA suggest a 1-39% stenosis, however dampened CCA waveforms ( with no diastolic flow) suggest probable  higher velocity in the ICA.  Left Carotid: Velocities in the left ICA are consistent with a 80-99% stenosis.  Vertebrals: Right vertebral artery demonstrates antegrade flow. Left vertebral artery demonstrates retrograde flow.  Subclavians: Normal flow hemodynamics were seen in the right subclavian artery. Left Subclavian flow appears retrograde.    Cath and PCI 06/03/2020: Ost RCA to Prox RCA lesion is 99% stenosed. Mid RCA lesion is 75% stenosed. Post intervention, there is a 0% residual stenosis. A drug-eluting stent was successfully placed using a STENT RESOLUTE ONYX 2.25X12. Post  intervention, there is a 15% residual stenosis. A drug-eluting stent was successfully placed using a STENT RESOLUTE ONYX 3.5X12.   Successful but very difficult angioplasty, intravascular lithotripsy and drug-coated balloon angioplasty to the ostial right coronary artery as well as drug-eluting stent placement to the mid right coronary artery.  Very difficult procedure due to calcifications, ostial stenosis and tortuosity.   Recommendations: Dual antiplatelet therapy for at least 12 months. Aggressive treatment of risk factors. Ostial RCA stent is high risk for in-stent restenosis.    [facility]/Anesthesiology Phone [phone] 12/14/2022 12:27 PM

## 2022-12-19 ENCOUNTER — Inpatient Hospital Stay (HOSPITAL_COMMUNITY)
Admission: RE | Admit: 2022-12-19 | Discharge: 2022-12-20 | DRG: 036 | Disposition: A | Discharge status: Home / Self Care | Payer: Medicare Other | Attending: Vascular Surgery | Admitting: Vascular Surgery

## 2022-12-19 ENCOUNTER — Encounter (HOSPITAL_COMMUNITY)
Admission: RE | Disposition: A | Discharge status: Home / Self Care | Payer: Self-pay | Source: Home / Self Care | Attending: Vascular Surgery

## 2022-12-19 ENCOUNTER — Other Ambulatory Visit: Payer: Self-pay

## 2022-12-19 ENCOUNTER — Inpatient Hospital Stay (HOSPITAL_COMMUNITY): Payer: Medicare Other

## 2022-12-19 ENCOUNTER — Inpatient Hospital Stay (HOSPITAL_COMMUNITY): Payer: Medicare Other | Admitting: Certified Registered Nurse Anesthetist

## 2022-12-19 ENCOUNTER — Inpatient Hospital Stay (HOSPITAL_COMMUNITY): Payer: Medicare Other | Admitting: Physician Assistant

## 2022-12-19 DIAGNOSIS — Z87891 Personal history of nicotine dependence: Secondary | ICD-10-CM | POA: Diagnosis not present

## 2022-12-19 DIAGNOSIS — Z888 Allergy status to other drugs, medicaments and biological substances status: Secondary | ICD-10-CM

## 2022-12-19 DIAGNOSIS — K219 Gastro-esophageal reflux disease without esophagitis: Secondary | ICD-10-CM | POA: Diagnosis not present

## 2022-12-19 DIAGNOSIS — Z825 Family history of asthma and other chronic lower respiratory diseases: Secondary | ICD-10-CM

## 2022-12-19 DIAGNOSIS — E039 Hypothyroidism, unspecified: Secondary | ICD-10-CM | POA: Diagnosis not present

## 2022-12-19 DIAGNOSIS — I358 Other nonrheumatic aortic valve disorders: Secondary | ICD-10-CM | POA: Diagnosis not present

## 2022-12-19 DIAGNOSIS — Z7982 Long term (current) use of aspirin: Secondary | ICD-10-CM | POA: Diagnosis not present

## 2022-12-19 DIAGNOSIS — I10 Essential (primary) hypertension: Secondary | ICD-10-CM | POA: Diagnosis not present

## 2022-12-19 DIAGNOSIS — I6523 Occlusion and stenosis of bilateral carotid arteries: Principal | ICD-10-CM | POA: Diagnosis present

## 2022-12-19 DIAGNOSIS — Z91018 Allergy to other foods: Secondary | ICD-10-CM | POA: Diagnosis not present

## 2022-12-19 DIAGNOSIS — M1990 Unspecified osteoarthritis, unspecified site: Secondary | ICD-10-CM | POA: Diagnosis present

## 2022-12-19 DIAGNOSIS — Z7902 Long term (current) use of antithrombotics/antiplatelets: Secondary | ICD-10-CM

## 2022-12-19 DIAGNOSIS — E559 Vitamin D deficiency, unspecified: Secondary | ICD-10-CM | POA: Diagnosis present

## 2022-12-19 DIAGNOSIS — I6522 Occlusion and stenosis of left carotid artery: Principal | ICD-10-CM

## 2022-12-19 DIAGNOSIS — I6529 Occlusion and stenosis of unspecified carotid artery: Secondary | ICD-10-CM | POA: Diagnosis present

## 2022-12-19 DIAGNOSIS — J45909 Unspecified asthma, uncomplicated: Secondary | ICD-10-CM | POA: Diagnosis not present

## 2022-12-19 DIAGNOSIS — Z79890 Hormone replacement therapy (postmenopausal): Secondary | ICD-10-CM | POA: Diagnosis not present

## 2022-12-19 DIAGNOSIS — D509 Iron deficiency anemia, unspecified: Secondary | ICD-10-CM | POA: Diagnosis present

## 2022-12-19 DIAGNOSIS — Z880 Allergy status to penicillin: Secondary | ICD-10-CM | POA: Diagnosis not present

## 2022-12-19 DIAGNOSIS — E785 Hyperlipidemia, unspecified: Secondary | ICD-10-CM | POA: Diagnosis present

## 2022-12-19 DIAGNOSIS — Z955 Presence of coronary angioplasty implant and graft: Secondary | ICD-10-CM | POA: Diagnosis not present

## 2022-12-19 DIAGNOSIS — F32A Depression, unspecified: Secondary | ICD-10-CM | POA: Diagnosis present

## 2022-12-19 DIAGNOSIS — Z79899 Other long term (current) drug therapy: Secondary | ICD-10-CM

## 2022-12-19 DIAGNOSIS — F419 Anxiety disorder, unspecified: Secondary | ICD-10-CM | POA: Diagnosis present

## 2022-12-19 DIAGNOSIS — I2510 Atherosclerotic heart disease of native coronary artery without angina pectoris: Secondary | ICD-10-CM | POA: Diagnosis not present

## 2022-12-19 DIAGNOSIS — Z885 Allergy status to narcotic agent status: Secondary | ICD-10-CM | POA: Diagnosis not present

## 2022-12-19 DIAGNOSIS — I739 Peripheral vascular disease, unspecified: Secondary | ICD-10-CM | POA: Diagnosis not present

## 2022-12-19 DIAGNOSIS — R011 Cardiac murmur, unspecified: Secondary | ICD-10-CM | POA: Diagnosis present

## 2022-12-19 HISTORY — PX: ULTRASOUND GUIDANCE FOR VASCULAR ACCESS: SHX6516

## 2022-12-19 HISTORY — PX: TRANSCAROTID ARTERY REVASCULARIZATIONÂ: SHX6778

## 2022-12-19 LAB — POCT ACTIVATED CLOTTING TIME: Activated Clotting Time: 256 s

## 2022-12-19 SURGERY — TRANSCAROTID ARTERY REVASCULARIZATION (TCAR)
Anesthesia: General | Site: Neck | Laterality: Right

## 2022-12-19 MED ORDER — HEPARIN SODIUM (PORCINE) 1000 UNIT/ML IJ SOLN
INTRAMUSCULAR | Status: AC
  Filled 2022-12-19: qty 10

## 2022-12-19 MED ORDER — AMISULPRIDE (ANTIEMETIC) 5 MG/2ML IV SOLN: 10.0000 mg | Freq: Once | INTRAVENOUS | Status: DC | PRN

## 2022-12-19 MED ORDER — POTASSIUM CHLORIDE CRYS ER 20 MEQ PO TBCR: 20.0000 meq | EXTENDED_RELEASE_TABLET | Freq: Every day | ORAL | Status: DC | PRN

## 2022-12-19 MED ORDER — ORAL CARE MOUTH RINSE
15.0000 mL | Freq: Once | OROMUCOSAL | Status: AC
  Administered 2022-12-19: 15 mL via OROMUCOSAL

## 2022-12-19 MED ORDER — ACETAMINOPHEN 650 MG RE SUPP: 325.0000 mg | RECTAL | Status: DC | PRN

## 2022-12-19 MED ORDER — DOCUSATE SODIUM 100 MG PO CAPS
100.0000 mg | ORAL_CAPSULE | Freq: Every day | ORAL | Status: DC
  Administered 2022-12-20: 100 mg via ORAL
  Filled 2022-12-19: qty 1

## 2022-12-19 MED ORDER — CHLORHEXIDINE GLUCONATE CLOTH 2 % EX PADS
6.0000 | MEDICATED_PAD | Freq: Every day | CUTANEOUS | Status: DC
  Administered 2022-12-19 – 2022-12-20 (×2): 6 via TOPICAL

## 2022-12-19 MED ORDER — HEPARIN SODIUM (PORCINE) 1000 UNIT/ML IJ SOLN
INTRAMUSCULAR | Status: DC | PRN
  Administered 2022-12-19: 4000 [IU] via INTRAVENOUS

## 2022-12-19 MED ORDER — ONDANSETRON HCL 4 MG/2ML IJ SOLN
INTRAMUSCULAR | Status: AC
  Filled 2022-12-19: qty 2

## 2022-12-19 MED ORDER — ASPIRIN 81 MG PO CHEW
81.0000 mg | CHEWABLE_TABLET | Freq: Once | ORAL | Status: AC
  Administered 2022-12-19: 81 mg via ORAL

## 2022-12-19 MED ORDER — HEPARIN 6000 UNIT IRRIGATION SOLUTION
Status: AC
  Filled 2022-12-19: qty 500

## 2022-12-19 MED ORDER — LACTATED RINGERS IV SOLN: INTRAVENOUS | Status: DC

## 2022-12-19 MED ORDER — MAGNESIUM SULFATE 2 GM/50ML IV SOLN: 2.0000 g | Freq: Every day | INTRAVENOUS | Status: DC | PRN

## 2022-12-19 MED ORDER — ALBUTEROL SULFATE (2.5 MG/3ML) 0.083% IN NEBU: 3.0000 mL | INHALATION_SOLUTION | Freq: Four times a day (QID) | RESPIRATORY_TRACT | Status: DC | PRN

## 2022-12-19 MED ORDER — LIDOCAINE 2% (20 MG/ML) 5 ML SYRINGE
INTRAMUSCULAR | Status: AC
  Filled 2022-12-19: qty 5

## 2022-12-19 MED ORDER — GUAIFENESIN-DM 100-10 MG/5ML PO SYRP: 15.0000 mL | ORAL_SOLUTION | ORAL | Status: DC | PRN

## 2022-12-19 MED ORDER — PHENYLEPHRINE HCL-NACL 20-0.9 MG/250ML-% IV SOLN
25.0000 ug/min | INTRAVENOUS | Status: DC
  Administered 2022-12-19: 25 ug/min via INTRAVENOUS
  Filled 2022-12-19: qty 250

## 2022-12-19 MED ORDER — PROPOFOL 10 MG/ML IV BOLUS
INTRAVENOUS | Status: AC
  Filled 2022-12-19: qty 20

## 2022-12-19 MED ORDER — ALUM & MAG HYDROXIDE-SIMETH 200-200-20 MG/5ML PO SUSP: 15.0000 mL | ORAL | Status: DC | PRN

## 2022-12-19 MED ORDER — OXYCODONE-ACETAMINOPHEN 5-325 MG PO TABS: 1.0000 | ORAL_TABLET | ORAL | Status: DC | PRN

## 2022-12-19 MED ORDER — ACETAMINOPHEN 325 MG PO TABS: 325.0000 mg | ORAL_TABLET | ORAL | Status: DC | PRN

## 2022-12-19 MED ORDER — CLOPIDOGREL BISULFATE 75 MG PO TABS
75.0000 mg | ORAL_TABLET | Freq: Every day | ORAL | Status: DC
  Administered 2022-12-20: 75 mg via ORAL
  Filled 2022-12-19: qty 1

## 2022-12-19 MED ORDER — EPHEDRINE SULFATE-NACL 50-0.9 MG/10ML-% IV SOSY
PREFILLED_SYRINGE | INTRAVENOUS | Status: DC | PRN
  Administered 2022-12-19: 2.5 mg via INTRAVENOUS
  Administered 2022-12-19: 5 mg via INTRAVENOUS

## 2022-12-19 MED ORDER — SODIUM CHLORIDE 0.9 % IV SOLN: 500.0000 mL | Freq: Once | INTRAVENOUS | Status: DC | PRN

## 2022-12-19 MED ORDER — CHLORHEXIDINE GLUCONATE 0.12 % MT SOLN: 15.0000 mL | Freq: Once | OROMUCOSAL | Status: AC

## 2022-12-19 MED ORDER — PROTAMINE SULFATE 10 MG/ML IV SOLN
INTRAVENOUS | Status: DC | PRN
  Administered 2022-12-19: 40 mg via INTRAVENOUS

## 2022-12-19 MED ORDER — ASPIRIN 81 MG PO TBEC
81.0000 mg | DELAYED_RELEASE_TABLET | Freq: Every morning | ORAL | Status: DC
  Administered 2022-12-20: 81 mg via ORAL
  Filled 2022-12-19: qty 1

## 2022-12-19 MED ORDER — BUSPIRONE HCL 10 MG PO TABS
5.0000 mg | ORAL_TABLET | Freq: Two times a day (BID) | ORAL | Status: DC
  Administered 2022-12-20: 5 mg via ORAL
  Filled 2022-12-19: qty 1

## 2022-12-19 MED ORDER — PANTOPRAZOLE SODIUM 40 MG PO TBEC
40.0000 mg | DELAYED_RELEASE_TABLET | Freq: Every day | ORAL | Status: DC
  Administered 2022-12-19 – 2022-12-20 (×2): 40 mg via ORAL
  Filled 2022-12-19 (×2): qty 1

## 2022-12-19 MED ORDER — SODIUM CHLORIDE 0.9 % IV SOLN: INTRAVENOUS | Status: DC

## 2022-12-19 MED ORDER — CITALOPRAM HYDROBROMIDE 20 MG PO TABS
20.0000 mg | ORAL_TABLET | Freq: Every day | ORAL | Status: DC
  Administered 2022-12-20: 20 mg via ORAL
  Filled 2022-12-19: qty 1

## 2022-12-19 MED ORDER — MORPHINE SULFATE (PF) 2 MG/ML IV SOLN: 2.0000 mg | INTRAVENOUS | Status: DC | PRN

## 2022-12-19 MED ORDER — HEPARIN 6000 UNIT IRRIGATION SOLUTION
Status: DC | PRN
  Administered 2022-12-19: 1

## 2022-12-19 MED ORDER — ROCURONIUM BROMIDE 10 MG/ML (PF) SYRINGE
PREFILLED_SYRINGE | INTRAVENOUS | Status: DC | PRN
  Administered 2022-12-19: 40 mg via INTRAVENOUS
  Administered 2022-12-19: 10 mg via INTRAVENOUS

## 2022-12-19 MED ORDER — PROTAMINE SULFATE 10 MG/ML IV SOLN
INTRAVENOUS | Status: AC
Start: 2022-12-19 — End: ?
  Filled 2022-12-19: qty 5

## 2022-12-19 MED ORDER — HEMOSTATIC AGENTS (NO CHARGE) OPTIME
TOPICAL | Status: DC | PRN
Start: 2022-12-19 — End: 2022-12-19
  Administered 2022-12-19: 1 via TOPICAL

## 2022-12-19 MED ORDER — LIDOCAINE-EPINEPHRINE (PF) 1 %-1:200000 IJ SOLN
INTRAMUSCULAR | Status: AC
  Filled 2022-12-19: qty 30

## 2022-12-19 MED ORDER — VASOPRESSIN 20 UNIT/ML IV SOLN
INTRAVENOUS | Status: AC
  Filled 2022-12-19: qty 1

## 2022-12-19 MED ORDER — OXYCODONE HCL 5 MG PO TABS: 5.0000 mg | ORAL_TABLET | Freq: Once | ORAL | Status: DC | PRN

## 2022-12-19 MED ORDER — ROCURONIUM BROMIDE 10 MG/ML (PF) SYRINGE
PREFILLED_SYRINGE | INTRAVENOUS | Status: AC
  Filled 2022-12-19: qty 20

## 2022-12-19 MED ORDER — FENTANYL CITRATE (PF) 250 MCG/5ML IJ SOLN
INTRAMUSCULAR | Status: AC
  Filled 2022-12-19: qty 5

## 2022-12-19 MED ORDER — ATORVASTATIN CALCIUM 80 MG PO TABS
80.0000 mg | ORAL_TABLET | Freq: Every day | ORAL | Status: DC
  Administered 2022-12-19 – 2022-12-20 (×2): 80 mg via ORAL
  Filled 2022-12-19 (×2): qty 1

## 2022-12-19 MED ORDER — VANCOMYCIN HCL IN DEXTROSE 1-5 GM/200ML-% IV SOLN
1000.0000 mg | INTRAVENOUS | Status: AC
  Administered 2022-12-19: 1000 mg via INTRAVENOUS
  Filled 2022-12-19: qty 200

## 2022-12-19 MED ORDER — PHENYLEPHRINE 80 MCG/ML (10ML) SYRINGE FOR IV PUSH (FOR BLOOD PRESSURE SUPPORT)
PREFILLED_SYRINGE | INTRAVENOUS | Status: AC
  Filled 2022-12-19: qty 10

## 2022-12-19 MED ORDER — GLYCOPYRROLATE PF 0.2 MG/ML IJ SOSY
PREFILLED_SYRINGE | INTRAMUSCULAR | Status: DC | PRN
  Administered 2022-12-19: .2 mg via INTRAVENOUS

## 2022-12-19 MED ORDER — CEFAZOLIN SODIUM-DEXTROSE 2-4 GM/100ML-% IV SOLN
2.0000 g | Freq: Three times a day (TID) | INTRAVENOUS | Status: AC
  Administered 2022-12-19 (×2): 2 g via INTRAVENOUS
  Filled 2022-12-19 (×2): qty 100

## 2022-12-19 MED ORDER — LEVOTHYROXINE SODIUM 75 MCG PO TABS
75.0000 ug | ORAL_TABLET | Freq: Every day | ORAL | Status: DC
  Administered 2022-12-20: 75 ug via ORAL
  Filled 2022-12-19: qty 1

## 2022-12-19 MED ORDER — PHENOL 1.4 % MT LIQD: 1.0000 | OROMUCOSAL | Status: DC | PRN

## 2022-12-19 MED ORDER — CHLORHEXIDINE GLUCONATE CLOTH 2 % EX PADS: 6.0000 | MEDICATED_PAD | Freq: Once | CUTANEOUS | Status: DC

## 2022-12-19 MED ORDER — ALBUMIN HUMAN 5 % IV SOLN
12.5000 g | Freq: Once | INTRAVENOUS | Status: AC
  Administered 2022-12-19: 12.5 g via INTRAVENOUS

## 2022-12-19 MED ORDER — HYDRALAZINE HCL 20 MG/ML IJ SOLN: 5.0000 mg | INTRAMUSCULAR | Status: DC | PRN

## 2022-12-19 MED ORDER — 0.9 % SODIUM CHLORIDE (POUR BTL) OPTIME
TOPICAL | Status: DC | PRN
  Administered 2022-12-19: 1000 mL

## 2022-12-19 MED ORDER — PHENYLEPHRINE 80 MCG/ML (10ML) SYRINGE FOR IV PUSH (FOR BLOOD PRESSURE SUPPORT)
PREFILLED_SYRINGE | INTRAVENOUS | Status: DC | PRN
  Administered 2022-12-19 (×2): 160 ug via INTRAVENOUS
  Administered 2022-12-19: 80 ug via INTRAVENOUS
  Administered 2022-12-19 (×2): 160 ug via INTRAVENOUS

## 2022-12-19 MED ORDER — ONDANSETRON HCL 4 MG/2ML IJ SOLN: 4.0000 mg | Freq: Four times a day (QID) | INTRAMUSCULAR | Status: DC | PRN

## 2022-12-19 MED ORDER — LIDOCAINE 2% (20 MG/ML) 5 ML SYRINGE
INTRAMUSCULAR | Status: DC | PRN
  Administered 2022-12-19: 40 mg via INTRAVENOUS

## 2022-12-19 MED ORDER — LACTATED RINGERS IV SOLN: INTRAVENOUS | Status: AC

## 2022-12-19 MED ORDER — ALBUMIN HUMAN 5 % IV SOLN: INTRAVENOUS | Status: DC | PRN

## 2022-12-19 MED ORDER — LIDOCAINE HCL 1 % IJ SOLN
INTRAMUSCULAR | Status: AC
  Filled 2022-12-19: qty 20

## 2022-12-19 MED ORDER — SUGAMMADEX SODIUM 200 MG/2ML IV SOLN
INTRAVENOUS | Status: DC | PRN
  Administered 2022-12-19: 120 mg via INTRAVENOUS

## 2022-12-19 MED ORDER — FENTANYL CITRATE (PF) 250 MCG/5ML IJ SOLN
INTRAMUSCULAR | Status: DC | PRN
  Administered 2022-12-19: 50 ug via INTRAVENOUS

## 2022-12-19 MED ORDER — PHENYLEPHRINE HCL-NACL 20-0.9 MG/250ML-% IV SOLN
INTRAVENOUS | Status: DC | PRN
  Administered 2022-12-19: 45 ug/min via INTRAVENOUS

## 2022-12-19 MED ORDER — ASPIRIN 81 MG PO CHEW
CHEWABLE_TABLET | ORAL | Status: AC
  Filled 2022-12-19: qty 1

## 2022-12-19 MED ORDER — SODIUM CHLORIDE 0.9 % IV SOLN: 250.0000 mL | INTRAVENOUS | Status: AC

## 2022-12-19 MED ORDER — HYDROMORPHONE HCL 1 MG/ML IJ SOLN
INTRAMUSCULAR | Status: AC
  Filled 2022-12-19: qty 1

## 2022-12-19 MED ORDER — SODIUM CHLORIDE 0.9 % IV SOLN: 12.5000 mg | INTRAVENOUS | Status: DC | PRN

## 2022-12-19 MED ORDER — ONDANSETRON HCL 4 MG/2ML IJ SOLN
INTRAMUSCULAR | Status: DC | PRN
  Administered 2022-12-19: 4 mg via INTRAVENOUS

## 2022-12-19 MED ORDER — PROPOFOL 10 MG/ML IV BOLUS
INTRAVENOUS | Status: DC | PRN
  Administered 2022-12-19: 80 mg via INTRAVENOUS
  Administered 2022-12-19 (×2): 20 mg via INTRAVENOUS

## 2022-12-19 MED ORDER — DEXAMETHASONE SODIUM PHOSPHATE 10 MG/ML IJ SOLN
INTRAMUSCULAR | Status: DC | PRN
  Administered 2022-12-19: 10 mg via INTRAVENOUS

## 2022-12-19 MED ORDER — OXYCODONE HCL 5 MG/5ML PO SOLN: 5.0000 mg | Freq: Once | ORAL | Status: DC | PRN

## 2022-12-19 MED ORDER — DEXAMETHASONE SODIUM PHOSPHATE 10 MG/ML IJ SOLN
INTRAMUSCULAR | Status: AC
  Filled 2022-12-19: qty 1

## 2022-12-19 MED ORDER — EPHEDRINE 5 MG/ML INJ
INTRAVENOUS | Status: AC
  Filled 2022-12-19: qty 5

## 2022-12-19 MED ORDER — IODIXANOL 320 MG/ML IV SOLN
INTRAVENOUS | Status: DC | PRN
  Administered 2022-12-19: 20 mL via INTRA_ARTERIAL

## 2022-12-19 MED ORDER — HYDROMORPHONE HCL 1 MG/ML IJ SOLN
0.2500 mg | INTRAMUSCULAR | Status: DC | PRN
Start: 2022-12-19 — End: 2022-12-19
  Administered 2022-12-19: 0.25 mg via INTRAVENOUS

## 2022-12-19 MED ORDER — VASOPRESSIN 20 UNIT/ML IV SOLN
INTRAVENOUS | Status: DC | PRN
  Administered 2022-12-19 (×4): 1 [IU] via INTRAVENOUS
  Administered 2022-12-19: .5 [IU] via INTRAVENOUS

## 2022-12-19 MED ORDER — BENAZEPRIL HCL 20 MG PO TABS
40.0000 mg | ORAL_TABLET | Freq: Every day | ORAL | Status: DC
  Filled 2022-12-19: qty 2

## 2022-12-19 MED ORDER — LIDOCAINE-EPINEPHRINE (PF) 1 %-1:200000 IJ SOLN
INTRAMUSCULAR | Status: DC | PRN
Start: 2022-12-19 — End: 2022-12-19
  Administered 2022-12-19: 9 mL via INTRADERMAL

## 2022-12-19 SURGICAL SUPPLY — 48 items
ADH SKN CLS APL DERMABOND .7 (GAUZE/BANDAGES/DRESSINGS) ×4
BAG BANDED W/RUBBER/TAPE 36X54 (MISCELLANEOUS) ×2 IMPLANT
BAG COUNTER SPONGE SURGICOUNT (BAG) ×2 IMPLANT
BAG EQP BAND 135X91 W/RBR TAPE (MISCELLANEOUS) ×2
BAG SPNG CNTER NS LX DISP (BAG) ×2
CANISTER SUCT 3000ML PPV (MISCELLANEOUS) ×2 IMPLANT
CATH BALLN ENROUTE 5X25 (CATHETERS) IMPLANT
CATH BALLN ENROUTE 6X25 (CATHETERS) IMPLANT
CLIP TI MEDIUM 6 (CLIP) ×2 IMPLANT
CLIP TI WIDE RED SMALL 6 (CLIP) ×2 IMPLANT
COVER DOME SNAP 22 D (MISCELLANEOUS) ×2 IMPLANT
COVER PROBE W GEL 5X96 (DRAPES) ×2 IMPLANT
DERMABOND ADVANCED .7 DNX12 (GAUZE/BANDAGES/DRESSINGS) ×2 IMPLANT
DRAPE FEMORAL ANGIO 80X135IN (DRAPES) ×2 IMPLANT
ELECT REM PT RETURN 9FT ADLT (ELECTROSURGICAL) ×2
ELECTRODE REM PT RTRN 9FT ADLT (ELECTROSURGICAL) ×2 IMPLANT
GLOVE BIO SURGEON STRL SZ7.5 (GLOVE) ×2 IMPLANT
GOWN STRL REUS W/ TWL LRG LVL3 (GOWN DISPOSABLE) ×4 IMPLANT
GOWN STRL REUS W/ TWL XL LVL3 (GOWN DISPOSABLE) ×2 IMPLANT
GOWN STRL REUS W/TWL LRG LVL3 (GOWN DISPOSABLE) ×4
GOWN STRL REUS W/TWL XL LVL3 (GOWN DISPOSABLE) ×2
GUIDEWIRE ENROUTE 0.014 (WIRE) ×2 IMPLANT
HEMOSTAT SNOW SURGICEL 2X4 (HEMOSTASIS) IMPLANT
KIT BASIN OR (CUSTOM PROCEDURE TRAY) ×2 IMPLANT
KIT ENCORE 26 ADVANTAGE (KITS) ×2 IMPLANT
KIT INTRODUCER GALT 7 (INTRODUCER) ×2 IMPLANT
KIT TURNOVER KIT B (KITS) ×2 IMPLANT
NDL HYPO 25GX1X1/2 BEV (NEEDLE) IMPLANT
NEEDLE HYPO 25GX1X1/2 BEV (NEEDLE) ×2 IMPLANT
PACK CAROTID (CUSTOM PROCEDURE TRAY) ×2 IMPLANT
POSITIONER HEAD DONUT 9IN (MISCELLANEOUS) ×2 IMPLANT
PROTECTION STATION PRESSURIZED (MISCELLANEOUS) ×2
SET MICROPUNCTURE 5F STIFF (MISCELLANEOUS) ×2 IMPLANT
STATION PROTECTION PRESSURIZED (MISCELLANEOUS) ×2 IMPLANT
STENT TRANSCAROTID SYSTEM 8X30 (Permanent Stent) IMPLANT
SUT MNCRL AB 4-0 PS2 18 (SUTURE) ×2 IMPLANT
SUT PROLENE 5 0 C 1 24 (SUTURE) ×2 IMPLANT
SUT SILK 2 0 PERMA HAND 18 BK (SUTURE) ×2 IMPLANT
SUT VIC AB 3-0 SH 27 (SUTURE) ×2
SUT VIC AB 3-0 SH 27X BRD (SUTURE) ×2 IMPLANT
SYR 10ML LL (SYRINGE) ×6 IMPLANT
SYR 20ML LL LF (SYRINGE) ×2 IMPLANT
SYR CONTROL 10ML LL (SYRINGE) IMPLANT
SYSTEM TRANSCAROTID NEUROPRTCT (MISCELLANEOUS) ×2 IMPLANT
TOWEL GREEN STERILE (TOWEL DISPOSABLE) ×2 IMPLANT
TRANSCAROTID NEUROPROTECT SYS (MISCELLANEOUS) ×2
WATER STERILE IRR 1000ML POUR (IV SOLUTION) ×2 IMPLANT
WIRE BENTSON .035X145CM (WIRE) ×2 IMPLANT

## 2022-12-19 NOTE — Anesthesia Procedure Notes (Signed)
Arterial Line Insertion Start/End10/21/2024 8:54 AM Performed by: Alease Medina, CRNA  Patient location: Pre-op. Preanesthetic checklist: patient identified, IV checked, site marked, risks and benefits discussed, surgical consent, monitors and equipment checked, pre-op evaluation, timeout performed and anesthesia consent Lidocaine 1% used for infiltration Left, radial was placed Catheter size: 20 G Hand hygiene performed  and maximum sterile barriers used   Attempts: 1 Procedure performed without using ultrasound guided technique. Following insertion, dressing applied and Biopatch. Post procedure assessment: normal and unchanged  Patient tolerated the procedure well with no immediate complications.

## 2022-12-19 NOTE — Op Note (Addendum)
Date: December 19, 2022  Preoperative diagnosis: Asymptomatic high-grade left internal carotid artery stenosis over 80%  Postoperative diagnosis: Same  Procedure: 1.  Ultrasound-guided access right common femoral vein for delivery of TCAR venous sheath 2.  Transcatheter placement of left cervical carotid artery stent after angioplasty with flow reversal for distal embolic protection (left TCAR)  Surgeon: Dr. Cephus Shelling, MD  Assistant: Aggie Moats, PA  Indications: 77 year old female previously underwent a right carotid endarterectomy now with right carotid artery occlusion (silent occlusion).  She subsequently developed a high-grade left ICA stenosis greater than 80% that is asymptomatic.  She presents for left TCAR given high risk with contralateral carotid occlusion.  Risks benefits discussed.  An assistant was needed given the complexity of the case and also for wire and sheath exchange.  Findings: Ultrasound-guided access right common femoral vein for delivery of TCAR venous sheath.  Transverse incision over the left neck above the collarbone and dissected out the common carotid artery where we placed a pursestring and this was accessed percutaneously for arterial access.  Once on active flow reversal the lesion in the left internal carotid artery was crossed and angioplasty was performed with a 5 mm x 25 mm angioplasty balloon stented with an 8 mm x 30 mm Enroute stent in the distal common carotid and proximal ICA.  The stent was postdilated with a 6 mm x 25 mm angioplasty balloon that was widely patent at completion with no residual stenosis.  Flow reversal time: 10 minutes  Anesthesia: General  Details: Patient was taken to the operating room after informed consent was obtained.  Placed on operative table in the supine position.  General endotracheal anesthesia was induced.  The left neck and bilateral groins were then prepped and draped in standard sterile fashion.   Antibiotics were given and timeout performed.  Initially evaluated the right common femoral vein with ultrasound, it was patent, an image was saved.  This was accessed with micro access needle, placed a microwire, and a microsheath then placed a Bentson wire and the TCAR venous sheath in the right common femoral vein.  We then turned attention to the left neck where transverse incision was made 1 fingerbreadth above the collarbone over the sternocleidomastoid.  Dissected down through the platysma and dissected between the heads of the sternocleidomastoid.  The internal jugular vein was mobilized laterally and we saw the vagus nerve that was preserved.  The common carotid artery was then dissected out controlled in the base of the wound with a umbilical tape and a vessel loop.  Patient was given 100 units/kg IV heparin.  We checked an ACT to maintain greater than 250.  I placed a pursestring on the anterior wall of the common carotid with a 5-0 Prolene.  We then accessed the left common carotid artery with a micro access needle and microwire and then a microsheath.  We then got a left carotid angiogram to identify the carotid bifurcation.  Advanced a J-wire and then ultimately placed our working arterial sheath in the left common carotid artery under fluoroscopic guidance with the help of my assistant.  Then connected the filter to the venous sheath in the groin and went on passive flow reversal.  We then got an additional image to identify the lesion in the left internal carotid artery.  Once we had a therapeutic ACT we and performed a TCAR timeout we went on active clamp and had active flow reversal.  The lesion was then crossed in the internal  carotid artery was then treated with a 5 mm x 25 mm angioplasty balloon and then stented with an 8 mm x 30 mm Enroute stent with the help of my assistant.  There was greater than 30% residual stenosis in the stent so we postdilated this with a 6 mm x 25 mm angioplasty  balloon and now widely patent stent.  We allowed another 2 minutes of flow reversal.  Final imaging showed widely patent stent.  We removed the wire and came off clamp.  Then removed the sheath tying down the pursestring with good hemostasis.  We listen with Doppler and had good flow in the carotid.  Protamine was given for reversal.  The wound was irrigated out we used Surgicel snow.  We then placed 3-0 Vicryl in the platysma 4-0 Monocryl in the skin with Dermabond and I injected 1% lidocaine with epinephrine for the skin edges that were bleeding.  The femoral sheath was removed and pressure was held.  Awakened neurologically intact.  Complication: None  Condition: Stable  Cephus Shelling, MD [facility] Office: [phone]   Cephus Shelling

## 2022-12-19 NOTE — Anesthesia Procedure Notes (Signed)
Procedure Name: Intubation Date/Time: 12/19/2022 9:48 AM  Performed by: Alease Medina, CRNAPre-anesthesia Checklist: Patient identified, Emergency Drugs available, Suction available and Patient being monitored Patient Re-evaluated:Patient Re-evaluated prior to induction Oxygen Delivery Method: Circle system utilized Preoxygenation: Pre-oxygenation with 100% oxygen Induction Type: IV induction Ventilation: Mask ventilation without difficulty and Oral airway inserted - appropriate to patient size Laryngoscope Size: Mac Grade View: Grade I Tube type: Oral Tube size: 6.5 mm Number of attempts: 1 Airway Equipment and Method: Stylet and Oral airway Placement Confirmation: ETT inserted through vocal cords under direct vision, positive ETCO2 and breath sounds checked- equal and bilateral Secured at: 20 cm Tube secured with: Tape Dental Injury: Teeth and Oropharynx as per pre-operative assessment

## 2022-12-19 NOTE — H&P (Signed)
History and Physical Interval Note:  12/19/2022 8:50 AM  Alexis [name]  has presented today for surgery, with the diagnosis of Left Carotid Artery Stenosis.  The various methods of treatment have been discussed with the patient and family. After consideration of risks, benefits and other options for treatment, the patient has consented to  Procedure(s): Left Transcarotid Artery Revascularization (Left) as a surgical intervention.  The patient's history has been reviewed, patient examined, no change in status, stable for surgery.  I have reviewed the patient's chart and labs.  Questions were answered to the patient's satisfaction.     Cephus Shelling     Patient name: Alexis [name]           MRN: [id number]        DOB: [birth year]          Sex: female   REASON FOR CONSULT: 24-month follow-up for surveillance of carotid artery disease   HPI: Alexis [name] is a 77 y.o. female, history of hypertension, hyperlipidemia, PAD with claudication, coronary artery disease that presents for follow-up after CTA neck for further evaluation of her carotid artery disease.  Patient previously underwent right carotid endarterectomy on 07/05/2021.  Recently noted to have progression of for her left carotid stenosis from a duplex on 11/08/2022 with a 80-99% left ICA stenosis.  She was sent for CTA neck that was done on 11/22/2022.  This shows right ICA occlusion.       Past Medical History:  Diagnosis Date   Anemia     Anxiety     Arthritis     Asthma     Claudication (HCC)      stent in bil iliac 11/25/10   Coronary artery disease     GERD (gastroesophageal reflux disease)     HTN (hypertension)     Hyperlipemia     Hypothyroidism     Murmur, cardiac      echo 10/12/10- EF >55%, mod sclerotic aortic valve   PONV (postoperative nausea and vomiting)     PVD (peripheral vascular disease) (HCC)      lower ext and upper ext-L subclavian stenosis by ultrasound, nl nuclear test- 10/27/10   Tobacco  abuse     Vitamin D deficiency                 Past Surgical History:  Procedure Laterality Date   CORONARY PRESSURE/FFR STUDY N/A 05/21/2020    Procedure: INTRAVASCULAR PRESSURE WIRE/FFR STUDY;  Surgeon: Runell Gess, MD;  Location: [facility];  Service: Cardiovascular;  Laterality: N/A;   CORONARY STENT INTERVENTION N/A 06/03/2020    Procedure: CORONARY STENT INTERVENTION;  Surgeon: Iran Ouch, MD;  Location: [facility];  Service: Cardiovascular;  Laterality: N/A;   ENDARTERECTOMY Right 07/05/2021    Procedure: RIGHT ENDARTERECTOMY CAROTID;  Surgeon: Cephus Shelling, MD;  Location[facility];  Service: Vascular;  Laterality: Right;   KNEE SURGERY       LEFT HEART CATH AND CORONARY ANGIOGRAPHY N/A 05/21/2020    Procedure: LEFT HEART CATH AND CORONARY ANGIOGRAPHY;  Surgeon: Runell Gess, MD;  Location: [facility];  Service: Cardiovascular;  Laterality: N/A;   OPERATIVE ULTRASOUND   07/05/2021    Procedure: OPERATIVE ULTRASOUND;  Surgeon: Cephus Shelling, MD;  Location: [facility] OR;  Service: Vascular;;   PATCH ANGIOPLASTY Right 07/05/2021    Procedure: PATCH ANGIOPLASTY;  Surgeon: Cephus Shelling, MD;  Location: [facility];  Service: Vascular;  Laterality: Right;   peripheral angio   11/25/10    right iliac-6x27 Express balloon expandable stent; Left iliac 6x37   PERIPHERAL INTRAVASCULAR LITHOTRIPSY   06/03/2020    Procedure: INTRAVASCULAR LITHOTRIPSY;  Surgeon: Iran Ouch, MD;  Location: [facility];  Service: Cardiovascular;;   TONSILLECTOMY                   Family History  Problem Relation Age of Onset   Allergic rhinitis Father     Allergic rhinitis Maternal Grandmother     Urticaria Maternal Grandmother     Asthma Maternal Grandfather     Allergic rhinitis Paternal Grandmother     Angioedema Neg Hx     Eczema Neg Hx     Immunodeficiency Neg Hx            SOCIAL HISTORY: Social History         Socioeconomic History    Marital status: Married      Spouse name: Not on file   Number of children: Not on file   Years of education: Not on file   Highest education level: Not on file  Occupational History   Not on file  Tobacco Use   Smoking status: Former      Current packs/day: 0.00      Types: Cigarettes      Quit date: 02/28/2002      Years since quitting: 20.7      Passive exposure: Never   Smokeless tobacco: Never  Vaping Use   Vaping status: Never Used  Substance and Sexual Activity   Alcohol use: No   Drug use: No   Sexual activity: Not on file  Other Topics Concern   Not on file  Social History Narrative   Not on file    Social Determinants of Health        Financial Resource Strain: Low Risk  (03/22/2022)    Overall Financial Resource Strain (CARDIA)     Difficulty of Paying Living Expenses: Not hard at all  Food Insecurity: No Food Insecurity (03/22/2022)    Hunger Vital Sign     Worried About Running Out of Food in the Last Year: Never true     Ran Out of Food in the Last Year: Never true  Transportation Needs: No Transportation Needs (03/22/2022)    PRAPARE - Therapist, art (Medical): No     Lack of Transportation (Non-Medical): No  Physical Activity: Unknown (03/22/2022)    Exercise Vital Sign     Days of Exercise per Week: 0 days     Minutes of Exercise per Session: Not on file  Stress: No Stress Concern Present (03/22/2022)    [employer] of Occupational Health - Occupational Stress Questionnaire     Feeling of Stress : Not at all  Social Connections: Socially Integrated (03/22/2022)    Social Connection and Isolation Panel [NHANES]     Frequency of Communication with Friends and Family: More than three times a week     Frequency of Social Gatherings with Friends and Family: Three times a week     Attends Religious Services: More than 4 times per year     Active Member of Clubs or Organizations: Yes     Attends Banker Meetings:  More than 4 times per year     Marital Status: Married  Catering manager Violence: Not At Risk (03/22/2022)    Humiliation, Afraid, Rape, and  Kick questionnaire     Fear of Current or Ex-Partner: No     Emotionally Abused: No     Physically Abused: No     Sexually Abused: No      Allergies       Allergies  Allergen Reactions   Demerol [Meperidine] Nausea And Vomiting   Metoprolol        asthma   Oatmeal Hives   Penicillins Rash              Current Outpatient Medications  Medication Sig Dispense Refill   albuterol (VENTOLIN HFA) 108 (90 Base) MCG/ACT inhaler Inhale 2 puffs into the lungs every 6 (six) hours as needed for wheezing or shortness of breath. 18 g 2   aspirin EC 81 MG tablet Take 81 mg by mouth in the morning. Swallow whole.       atorvastatin (LIPITOR) 80 MG tablet TAKE ONE TABLET BY MOUTH ONCE A DAY 90 tablet 3   benazepril (LOTENSIN) 40 MG tablet TAKE ONE TABLET BY MOUTH ONCE A DAY 90 tablet 1   busPIRone (BUSPAR) 5 MG tablet Take 1 tablet (5 mg total) by mouth 2 (two) times daily. 60 tablet 3   Cholecalciferol (VITAMIN D3) 50 MCG (2000 UT) TABS Take 2,000 Units by mouth in the morning.       citalopram (CELEXA) 40 MG tablet Take 0.5 tablets (20 mg total) by mouth daily. 90 tablet 1   clopidogrel (PLAVIX) 75 MG tablet TAKE 1 TABLET BY MOUTH ONCE A DAY WITH BREAKFAST 90 tablet 3   diphenhydrAMINE (BENADRYL) 25 MG tablet Take 25-50 mg by mouth every 6 (six) hours as needed for itching (ECZEMA).       DUPIXENT 300 MG/2ML SOPN Inject into the skin.       ferrous sulfate 324 MG TBEC Take 324 mg by mouth daily with breakfast.       levothyroxine (SYNTHROID) 75 MCG tablet TAKE ONE TABLET BY MOUTH EVERY MORNING BEFORE BREAKFAST 30 tablet 0   Multiple Vitamins-Minerals (PRESERVISION AREDS 2) CAPS Take 1 capsule by mouth once a week.       nitroGLYCERIN (NITROSTAT) 0.4 MG SL tablet Place 1 tablet (0.4 mg total) under the tongue every 5 (five) minutes as needed for chest pain.  25 tablet 3   oxyCODONE-acetaminophen (PERCOCET/ROXICET) 5-325 MG tablet Take 1 tablet by mouth every 6 (six) hours as needed for moderate pain. (Patient not taking: Reported on 11/30/2022) 12 tablet 0   pantoprazole (PROTONIX) 40 MG tablet TAKE ONE TABLET BY MOUTH ONCE DAILY 90 tablet 1      No current facility-administered medications for this visit.        REVIEW OF SYSTEMS:  [X]  denotes positive finding, [ ]  denotes negative finding Cardiac   Comments:  Chest pain or chest pressure:      Shortness of breath upon exertion:      Short of breath when lying flat:      Irregular heart rhythm:             Vascular      Pain in calf, thigh, or hip brought on by ambulation:      Pain in feet at night that wakes you up from your sleep:       Blood clot in your veins:      Leg swelling:              Pulmonary      Oxygen at home:  Productive cough:       Wheezing:              Neurologic      Sudden weakness in arms or legs:       Sudden numbness in arms or legs:       Sudden onset of difficulty speaking or slurred speech:      Temporary loss of vision in one eye:       Problems with dizziness:              Gastrointestinal      Blood in stool:       Vomited blood:              Genitourinary      Burning when urinating:       Blood in urine:             Psychiatric      Major depression:              Hematologic      Bleeding problems:      Problems with blood clotting too easily:             Skin      Rashes or ulcers:             Constitutional      Fever or chills:          PHYSICAL EXAM: There were no vitals filed for this visit.     GENERAL: The patient is a well-nourished female, in no acute distress. The vital signs are documented above. CARDIAC: There is a regular rate and rhythm.  VASCULAR:  Right carotid neck incision well-healed PULMONARY: No respiratory distress ABDOMEN: Soft and non-tender. MUSCULOSKELETAL: There are no major deformities or  cyanosis. NEUROLOGIC: No focal weakness or paresthesias are detected.  Cranial nerves II through XII grossly intact. SKIN: There are no ulcers or rashes noted. PSYCHIATRIC: The patient has a normal affect.   DATA:    Carotid duplex 11/08/2022 shows a 80-99% left ICA stenosis with a velocity of 581/159.   CTA neck reviewed showing right ICA occlusion.  She has a high-grade left ICA stenosis.   Assessment/Plan:   77 y.o. female, history of hypertension, hyperlipidemia, PAD with claudication, coronary artery disease that presents for follow-up after CTA neck for further evaluation of her carotid artery disease.  Patient previously underwent right carotid endarterectomy on 07/05/2021.  Recently noted to have progression of for her left carotid stenosis from a duplex on 11/08/2022 with a 80-99% left ICA stenosis.  She was sent for CTA neck that was done on 11/22/2022.    Interestingly her CTA neck shows her right ICA is occluded on the side of previous endarterectomy.  This is a silent occlusion.  She is grossly neurologically intact today.  I discussed there would be no indication for intervention on this occluded carotid.  On the contralateral left side she has a high-grade ICA stenosis.  I have recommended left TCAR as I think she is high surgical risk given the contralateral right ICA occlusion.  I discussed flow reversal with angioplasty and stenting in the OR.  I discussed this is for stroke risk reduction.  I do not think she has a high-grade left common carotid stenosis on the left as noted on CT as I reviewed her duplex velocities on the side.  I discussed 1% stroke risk along with other risk and benefits.  Will get scheduled at [facility].  She is on aspirin statin Plavix for carotid stenting.     Cephus Shelling, MD [facility] [place] [place] Office: [phone]

## 2022-12-19 NOTE — Anesthesia Postprocedure Evaluation (Signed)
Anesthesia Post Note  Patient: Alexis [name]  Procedure(s) Performed: Left Transcarotid Artery Revascularization (Left: Neck) ULTRASOUND GUIDANCE FOR VASCULAR ACCESS, RIGHT FEMORAL VEIN (Right: Groin)     Patient location during evaluation: PACU Anesthesia Type: General Level of consciousness: awake and alert Pain management: pain level controlled Vital Signs Assessment: post-procedure vital signs reviewed and stable Respiratory status: spontaneous breathing, nonlabored ventilation and respiratory function stable Cardiovascular status: blood pressure returned to baseline and stable Postop Assessment: no apparent nausea or vomiting Anesthetic complications: no   No notable events documented.  Last Vitals:  Vitals:   12/19/22 1300 12/19/22 1315  BP: (!) 86/42 (!) 101/55  Pulse: 93 94  Resp: (!) 27 17  Temp:    SpO2: 96% 97%    Last Pain:  Vitals:   12/19/22 1200  TempSrc:   PainSc: 3                  Lowella Curb

## 2022-12-19 NOTE — Progress Notes (Signed)
Patient underwent left TCAR today for high-grade asymptomatic stenosis without issue.  In the recovery room her pressure dropped into the 70s and there was some concern for left-sided weakness.  When they pushed her blood pressure back up she had a normal neuroexam.  On my evaluation she is completely neuro intact.  She does have a known right internal carotid occlusion.  I will put her in the ICU tonight for close observation.  She is on neo at 25 mcg.  Goal should be systolics greater than 120.  Cephus Shelling, MD [facility] [place] Office: [phone]   Cephus Shelling

## 2022-12-19 NOTE — Transfer of Care (Signed)
Immediate Anesthesia Transfer of Care Note  Patient: Alexis [name]  Procedure(s) Performed: Left Transcarotid Artery Revascularization (Left: Neck) ULTRASOUND GUIDANCE FOR VASCULAR ACCESS, RIGHT FEMORAL VEIN (Right: Groin)  Patient Location: PACU  Anesthesia Type:General  Level of Consciousness: drowsy and patient cooperative  Airway & Oxygen Therapy: Patient Spontanous Breathing and Patient connected to nasal cannula oxygen  Post-op Assessment: Report given to RN, Post -op Vital signs reviewed and stable, and Patient moving all extremities X 4  Post vital signs: Reviewed and stable  Last Vitals:  Vitals Value Taken Time  BP 175/58 12/19/22 1134  Temp    Pulse 102 12/19/22 1135  Resp 19 12/19/22 1135  SpO2 94 % 12/19/22 1132  Vitals shown include unfiled device data.  Last Pain:  Vitals:   12/19/22 0737  TempSrc: Oral         Complications: No notable events documented. Manual and invasive blood pressure are not reading similar values. Discussed values with Dr. Hyacinth Meeker and asked to come see the patient. Dr. Chestine Spore at bedside. After assessment, determined to use manual BP for reading.

## 2022-12-19 NOTE — Discharge Instructions (Signed)
   [facility]  Discharge Instructions   Carotid Endarterectomy (CEA)  Please refer to the following instructions for your post-procedure care. Your surgeon or physician assistant will discuss any changes with you.  Activity  You are encouraged to walk as much as you can. You can slowly return to normal activities but must avoid strenuous activity and heavy lifting until your doctor tell you it's OK. Avoid activities such as vacuuming or swinging a golf club. You can drive after one week if you are comfortable and you are no longer taking prescription pain medications. It is normal to feel tired for serval weeks after your surgery. It is also normal to have difficulty with sleep habits, eating, and bowel movements after surgery. These will go away with time.  Bathing/Showering  You may shower after you come home. Do not soak in a bathtub, hot tub, or swim until the incision heals completely.  Incision Care  Shower every day. Clean your incision with mild soap and water. Pat the area dry with a clean towel. You do not need a bandage unless otherwise instructed. Do not apply any ointments or creams to your incision. You may have skin glue on your incision. Do not peel it off. It will come off on its own in about one week. Your incision may feel thickened and raised for several weeks after your surgery. This is normal and the skin will soften over time. For Men Only: It's OK to shave around the incision but do not shave the incision itself for 2 weeks. It is common to have numbness under your chin that could last for several months.  Diet  Resume your normal diet. There are no special food restrictions following this procedure. A low fat/low cholesterol diet is recommended for all patients with vascular disease. In order to heal from your surgery, it is CRITICAL to get adequate nutrition. Your body requires vitamins, minerals, and protein. Vegetables are the best  source of vitamins and minerals. Vegetables also provide the perfect balance of protein. Processed food has little nutritional value, so try to avoid this.        Medications  Resume taking all of your medications unless your doctor or physician assistant tells you not to. If your incision is causing pain, you may take over-the- counter pain relievers such as acetaminophen (Tylenol). If you were prescribed a stronger pain medication, please be aware these medications can cause nausea and constipation. Prevent nausea by taking the medication with a snack or meal. Avoid constipation by drinking plenty of fluids and eating foods with a high amount of fiber, such as fruits, vegetables, and grains. Do not take Tylenol if you are taking prescription pain medications.  Follow Up  Our office will schedule a follow up appointment 2-3 weeks following discharge.  Please call us immediately for any of the following conditions  Increased pain, redness, drainage (pus) from your incision site. Fever of 101 degrees or higher. If you should develop stroke (slurred speech, difficulty swallowing, weakness on one side of your body, loss of vision) you should call 911 and go to the nearest emergency room.  Reduce your risk of vascular disease:  Stop smoking. If you would like help call [facility] at 1-800-QUIT-NOW (1-800-784-8669) or [facility] at 336-586-4000. Manage your cholesterol Maintain a desired weight Control your diabetes Keep your blood pressure down  If you have any questions, please call the office at [phone].   

## 2022-12-20 ENCOUNTER — Other Ambulatory Visit: Payer: Self-pay

## 2022-12-20 ENCOUNTER — Encounter (HOSPITAL_COMMUNITY): Payer: Self-pay | Admitting: Vascular Surgery

## 2022-12-20 LAB — BASIC METABOLIC PANEL
Anion gap: 12 (ref 5–15)
BUN: 14 mg/dL (ref 8–23)
CO2: 29 mmol/L (ref 22–32)
Calcium: 9.5 mg/dL (ref 8.9–10.3)
Chloride: 100 mmol/L (ref 98–111)
Creatinine, Ser: 0.95 mg/dL (ref 0.44–1.00)
GFR, Estimated: >60 mL/min (ref >60)
Glucose, Bld: 99 mg/dL (ref 70–99)
Potassium: 4.3 mmol/L (ref 3.5–5.1)
Sodium: 141 mmol/L (ref 135–145)

## 2022-12-20 LAB — CBC
HCT: 27.3 % — ABNORMAL LOW (ref 36.0–46.0)
Hemoglobin: 8.9 g/dL — ABNORMAL LOW (ref 12.0–15.0)
MCH: 34.5 pg — ABNORMAL HIGH (ref 26.0–34.0)
MCHC: 32.6 g/dL (ref 30.0–36.0)
MCV: 105.8 fL — ABNORMAL HIGH (ref 80.0–100.0)
Platelets: 224 10*3/uL (ref 150–400)
RBC: 2.58 MIL/uL — ABNORMAL LOW (ref 3.87–5.11)
RDW: 13.5 % (ref 11.5–15.5)
WBC: 7.8 10*3/uL (ref 4.0–10.5)
nRBC: 0 % (ref 0.0–0.2)

## 2022-12-20 MED ORDER — PSEUDOEPHEDRINE HCL ER 120 MG PO TB12
120.0000 mg | ORAL_TABLET | Freq: Two times a day (BID) | ORAL | Status: DC
  Administered 2022-12-20: 120 mg via ORAL
  Filled 2022-12-20 (×2): qty 1

## 2022-12-20 MED ORDER — OXYCODONE-ACETAMINOPHEN 5-325 MG PO TABS: 1.0000 | ORAL_TABLET | Freq: Four times a day (QID) | ORAL | 0 refills | Status: DC | PRN

## 2022-12-20 NOTE — Progress Notes (Signed)
Discharged to home with husband. All discharge instructions given and questions answered.

## 2022-12-20 NOTE — Progress Notes (Addendum)
[facility]  Subjective  - No new complaints.  Sitting up eating breakfast.   Objective (!) 122/53 88 97.8 F (36.6 C) (Oral) (!) 22 96%  Intake/Output Summary (Last 24 hours) at 12/20/2022 0727 Last data filed at 12/20/2022 0700 Gross per 24 hour  Intake 2955.02 ml  Output 630 ml  Net 2325.02 ml    No tongue deviation or facial droop Left neck incision without hematoma BP improving weaning down off Neo now at 15 mcg Moving all 4 extremities  Assessment/Planning: POD # 1 TCAR  Wean off neo possible discharge later today verses tomorrow pending Dr. Burna Forts exam. Cont. ASA, Plavix and Lipitor  Hold Lotensin    Mosetta Pigeon 12/20/2022 7:27 AM --  Laboratory Lab Results: Recent Labs    12/20/22 0321  WBC 7.8  HGB 8.9*  HCT 27.3*  PLT 224   BMET Recent Labs    12/20/22 0321  NA 141  K 4.3  CL 100  CO2 29  GLUCOSE 99  BUN 14  CREATININE 0.95  CALCIUM 9.5    COAG Lab Results  Component Value Date   INR 1.0 12/13/2022   INR 1.0 07/05/2021   No results found for: "PTT"  I have seen and evaluated the patient. I agree with the PA note as documented above.  Postop day 1 status post left TCAR for asymptomatic high-grade stenosis.  Went to the ICU due to hypotension in recovery.  Neo weaned to 15 mcg.  Grossly neurologically intact.  Incisions look fine.  Will order Sudafed to try and wean the ne-.  If we get her off of pressor and wean her oxygen hopefully home later today.  If not we will transfer to floor.  Will order dose of Sudafed.  Overall looks good.  Discussed if discharge later today aspirin statin Plavix.  Will arrange f/u in one month with carotid duplex.  Cephus Shelling, MD [facility] [place] Office: [phone]

## 2022-12-21 ENCOUNTER — Telehealth: Payer: Self-pay

## 2022-12-21 DIAGNOSIS — R509 Fever, unspecified: Secondary | ICD-10-CM | POA: Diagnosis not present

## 2022-12-30 NOTE — Telephone Encounter (Signed)
Dee from [facility] called in to see if provider is able to sign death certificate for this pt.   Please call Dee at [facility] at [phone]

## 2022-12-30 NOTE — Discharge Summary (Signed)
Carotid Discharge Summary     Alexis [name] [birth year] 77 y.o. female  [id number]  Admission Date: 12/19/2022  Discharge Date: 12/20/2022  Physician: Sherald Hess, MD  Admission Diagnosis: Carotid stenosis, left [I65.22] Carotid artery stenosis [facility]   Hospital Course:  The patient was admitted to the hospital and taken to the operating room on 12/19/2022 and underwent transcatheter placement of left cervical carotid artery stent after angioplasty with flow reversal for distal embolic protection by Dr. Chestine Spore. The pt tolerated the procedure well and was transported to the PACU in good condition.  In the recovery room her pressure dropped into the 70s and there was some concern for left-sided weakness.  When they pushed her blood pressure back up she had a normal neuroexam. She does have a known right internal carotid occlusion.  She was placed in ICU for close observation.  She was maintained on neo at 25 mcg.  Goal systolics greater than 120.   By POD 1, the pt neuro status remained intact. Left neck incision soft without swelling or hematoma. Able to wean down Neo. Home blood pressure medication held. Patient given dose of sudafed to help wean her off of the neo. Also requiring 3L Nasal cannula.   The remainder of the hospital course consisted of increasing mobilization, decreased oxygen supplementation, decreased pressor requirement, and increasing intake of solids without difficulty.  She remained stable later on post operative day 1 for discharge home. She will continue  on Aspirin, Statin and Plavix. She will resume all home medications as prescribed. She was given instructions to resume her home blood pressure medication if her systolic pressures remained > 782. PDMP was reviewed and post operative pain medication was also sent to her pharmacy. She will follow up with Dr. Chestine Spore in 1 month with carotid duplex   Recent Labs    12/20/22 0321  NA 141  K 4.3  CL  100  CO2 29  GLUCOSE 99  BUN 14  CALCIUM 9.5   Recent Labs    12/20/22 0321  WBC 7.8  HGB 8.9*  HCT 27.3*  PLT 224   No results for input(s): "INR" in the last 72 hours.   Discharge Instructions     CAROTID Sugery: Call MD for difficulty swallowing or speaking; weakness in arms or legs that is a new symtom; severe headache.  If you have increased swelling in the neck and/or  are having difficulty breathing, CALL 911   Complete by: As directed    Call MD for:  redness, tenderness, or signs of infection (pain, swelling, bleeding, redness, odor or green/yellow discharge around incision site)   Complete by: As directed    Call MD for:  severe or increased pain, loss or decreased feeling  in affected limb(s)   Complete by: As directed    Call MD for:  temperature >100.5   Complete by: As directed    Discharge wound care:   Complete by: As directed    You may shower and wash over incision with mild soap and water, pat dry. Do not soak in bathtub   Driving Restrictions   Complete by: As directed    No driving for 2 weeks or while taking narcotic pain medication   Increase activity slowly   Complete by: As directed    Walk with assistance use walker or cane as needed   Lifting restrictions   Complete by: As directed    No lifting for 4 weeks   May shower  Complete by: As directed    Resume previous diet   Complete by: As directed        Discharge Diagnosis:  Carotid stenosis, left [I65.22] Carotid artery stenosis [I65.29]  Secondary Diagnosis: Patient Active Problem List   Diagnosis Date Noted   Carotid stenosis, left 12/19/2022   Carotid artery stenosis 07/05/2021   Carotid stenosis 07/05/2021   Angina pectoris (HCC) 06/03/2020   Anemia 05/22/2020   Coronary artery disease involving native coronary artery of native heart without angina pectoris 05/21/2020   Intrinsic atopic dermatitis 09/25/2017   Eczema 09/09/2016   History of smoking greater than 50 pack  years 08/24/2016   Allergic rhinitis 06/11/2014   Extrinsic asthma 06/11/2014   Hyperlipemia    Essential hypertension    Anxiety    Hypothyroidism    GERD (gastroesophageal reflux disease)    Vitamin D deficiency    PAD (peripheral artery disease) (HCC) 07/19/2012   Carotid artery disease (HCC) 07/19/2012   Subclavian artery stenosis, left (HCC) 07/19/2012   Past Medical History:  Diagnosis Date   Anemia    Iron Deficiency   Anxiety    Arthritis    Asthma    Claudication (HCC)    stent in bil iliac 11/25/10   Coronary artery disease    Depression    GERD (gastroesophageal reflux disease)    Headache    Hx of migraines   History of hiatal hernia    HTN (hypertension)    Hyperlipemia    Hypothyroidism    Murmur, cardiac    echo 10/12/10- EF >55%, mod sclerotic aortic valve   PONV (postoperative nausea and vomiting)    PVD (peripheral vascular disease) (HCC)    lower ext and upper ext-L subclavian stenosis by ultrasound, nl nuclear test- 10/27/10   Tobacco abuse    Vitamin D deficiency     Allergies as of 12/20/2022       Reactions   Demerol [meperidine] Nausea And Vomiting   Metoprolol    asthma   Oatmeal Hives   Other Hives   CHG   Penicillins Rash        Medication List     TAKE these medications    albuterol 108 (90 Base) MCG/ACT inhaler Commonly known as: Ventolin HFA Inhale 2 puffs into the lungs every 6 (six) hours as needed for wheezing or shortness of breath.   aspirin EC 81 MG tablet Take 81 mg by mouth in the morning. Swallow whole.   atorvastatin 80 MG tablet Commonly known as: LIPITOR TAKE ONE TABLET BY MOUTH ONCE A DAY   benazepril 40 MG tablet Commonly known as: LOTENSIN TAKE ONE TABLET BY MOUTH ONCE A DAY   busPIRone 5 MG tablet Commonly known as: BUSPAR Take 1 tablet (5 mg total) by mouth 2 (two) times daily.   citalopram 40 MG tablet Commonly known as: CELEXA Take 0.5 tablets (20 mg total) by mouth daily.   clobetasol  cream 0.05 % Commonly known as: TEMOVATE Apply 1 Application topically 2 (two) times daily as needed (eczema).   clopidogrel 75 MG tablet Commonly known as: PLAVIX TAKE 1 TABLET BY MOUTH ONCE A DAY WITH BREAKFAST   diphenhydrAMINE 25 MG tablet Commonly known as: BENADRYL Take 25-50 mg by mouth every 6 (six) hours as needed for itching (ECZEMA).   Dupixent 300 MG/2ML Soaj Generic drug: Dupilumab Inject 300 mg into the skin every 14 (fourteen) days.   ferrous sulfate 324 MG Tbec Take 324 mg by mouth daily  with breakfast.   levothyroxine 75 MCG tablet Commonly known as: SYNTHROID TAKE ONE TABLET BY MOUTH EVERY MORNING BEFORE BREAKFAST PATIENT NEEDS TO SCHEDULE AN APPOINTMENT BEFORE ANYMORE REFILLS PER MD   nitroGLYCERIN 0.4 MG SL tablet Commonly known as: NITROSTAT Place 0.4 mg under the tongue every 5 (five) minutes as needed for chest pain.   oxyCODONE-acetaminophen 5-325 MG tablet Commonly known as: Percocet Take 1 tablet by mouth every 6 (six) hours as needed.   pantoprazole 40 MG tablet Commonly known as: PROTONIX TAKE ONE TABLET BY MOUTH ONCE DAILY   PreserVision AREDS 2 Caps Take 1 capsule by mouth.   Vitamin D3 50 MCG (2000 UT) Tabs Take 2,000 Units by mouth in the morning.               Discharge Care Instructions  (From admission, onward)           Start     Ordered   12/20/22 0000  Discharge wound care:       Comments: You may shower and wash over incision with mild soap and water, pat dry. Do not soak in bathtub   12/20/22 1538             Discharge Instructions:   [facility] Discharge Instructions Carotid Endarterectomy (CEA)  Please refer to the following instructions for your post-procedure care. Your surgeon or physician assistant will discuss any changes with you.  Activity  You are encouraged to walk as much as you can. You can slowly return to normal activities but must avoid strenuous  activity and heavy lifting until your doctor tell you it's OK. Avoid activities such as vacuuming or swinging a golf club. You can drive after one week if you are comfortable and you are no longer taking prescription pain medications. It is normal to feel tired for serval weeks after your surgery. It is also normal to have difficulty with sleep habits, eating, and bowel movements after surgery. These will go away with time.  Bathing/Showering  You may shower after you come home. Do not soak in a bathtub, hot tub, or swim until the incision heals completely.  Incision Care  Shower every day. Clean your incision with mild soap and water. Pat the area dry with a clean towel. You do not need a bandage unless otherwise instructed. Do not apply any ointments or creams to your incision. You may have skin glue on your incision. Do not peel it off. It will come off on its own in about one week. Your incision may feel thickened and raised for several weeks after your surgery. This is normal and the skin will soften over time. For Men Only: It's OK to shave around the incision but do not shave the incision itself for 2 weeks. It is common to have numbness under your chin that could last for several months.  Diet  Resume your normal diet. There are no special food restrictions following this procedure. A low fat/low cholesterol diet is recommended for all patients with vascular disease. In order to heal from your surgery, it is CRITICAL to get adequate nutrition. Your body requires vitamins, minerals, and protein. Vegetables are the best source of vitamins and minerals. Vegetables also provide the perfect balance of protein. Processed food has little nutritional value, so try to avoid this.  Medications  Resume taking all of your medications unless your doctor or physician assistant tells you not to.  If your incision is causing pain,  you may take over-the- counter pain relievers such as acetaminophen (Tylenol).  If you were prescribed a stronger pain medication, please be aware these medications can cause nausea and constipation.  Prevent nausea by taking the medication with a snack or meal. Avoid constipation by drinking plenty of fluids and eating foods with a high amount of fiber, such as fruits, vegetables, and grains. Do not take Tylenol if you are taking prescription pain medications.  Follow Up  Our office will schedule a follow up appointment 2-3 weeks following discharge.  Please call us immediately for any of the following conditions  Increased pain, redness, drainage (pus) from your incision site. Fever of 101 degrees or higher. If you should develop stroke (slurred speech, difficulty swallowing, weakness on one side of your body, loss of vision) you should call 911 and go to the nearest emergency room.  Reduce your risk of vascular disease:  Stop smoking. If you would like help call [facility] at 1-800-QUIT-NOW (240-718-8404) or [place] at [phone]. Manage your cholesterol Maintain a desired weight Control your diabetes Keep your blood pressure down  If you have any questions, please call the office at [phone].  Prescriptions given: Oxycodone-Acetaminophen #12 No Refill  Disposition: Home  Patient's condition: is Good  Follow up: 1. Dr. Chestine Spore in 2 weeks.   Rosan Calbert PA-C [facility] [phone]   --- For [facility] Registry use ---   Modified Rankin score at D/C (0-6): 0  IV medication needed for:  1. Hypertension: No 2. Hypotension: Yes  Post-op Complications: No  1. Post-op CVA or TIA: No  If yes: Event classification (right eye, left eye, right cortical, left cortical, verterobasilar, other): n/a  If yes: Timing of event (intra-op, <6 hrs post-op, >=6 hrs post-op, unknown): n/a  2. CN injury: No  If yes: CN not injuried   3. Myocardial infarction: No  If yes: Dx by (EKG or clinical, Troponin): n/a  4.  CHF: No  5.   Dysrhythmia (new): No  6. Wound infection: No  7. Reperfusion symptoms: No  8. Return to OR: No  If yes: return to OR for (bleeding, neurologic, other CEA incision, other): n/a  Discharge medications: Statin use:  Yes ASA use:  Yes   Beta blocker use:  No ACE-Inhibitor use:  Yes  ARB use:  No CCB use: No P2Y12 Antagonist use: Yes, Arly.Keller ] Plavix, [ ]  Plasugrel, [ ]  Ticlopinine, [ ]  Ticagrelor, [ ]  Other, [ ]  No for medical reason, [ ]  Non-compliant, [ ]  Not-indicated Anti-coagulant use:  No, [ ]  Warfarin, [ ]  Rivaroxaban, [ ]  Dabigatran

## 2022-12-30 DEATH — deceased

## 2023-01-17 ENCOUNTER — Other Ambulatory Visit: Payer: Self-pay | Admitting: *Deleted

## 2023-01-17 DIAGNOSIS — I6523 Occlusion and stenosis of bilateral carotid arteries: Secondary | ICD-10-CM

## 2023-02-01 ENCOUNTER — Ambulatory Visit (HOSPITAL_COMMUNITY): Payer: Medicare Other
